# Patient Record
Sex: Male | Born: 1955
Health system: Southern US, Community
[De-identification: ages and names within clinical notes are randomized; demographics above are authoritative.]

## PROBLEM LIST (undated history)

## (undated) DIAGNOSIS — K52831 Collagenous colitis: Secondary | ICD-10-CM

## (undated) DIAGNOSIS — I10 Essential (primary) hypertension: Secondary | ICD-10-CM

## (undated) DIAGNOSIS — I2510 Atherosclerotic heart disease of native coronary artery without angina pectoris: Secondary | ICD-10-CM

## (undated) DIAGNOSIS — A4902 Methicillin resistant Staphylococcus aureus infection, unspecified site: Secondary | ICD-10-CM

## (undated) DIAGNOSIS — K279 Peptic ulcer, site unspecified, unspecified as acute or chronic, without hemorrhage or perforation: Secondary | ICD-10-CM

## (undated) DIAGNOSIS — Z8679 Personal history of other diseases of the circulatory system: Secondary | ICD-10-CM

## (undated) DIAGNOSIS — M545 Low back pain, unspecified: Secondary | ICD-10-CM

## (undated) DIAGNOSIS — G8929 Other chronic pain: Secondary | ICD-10-CM

## (undated) DIAGNOSIS — I4892 Unspecified atrial flutter: Secondary | ICD-10-CM

## (undated) DIAGNOSIS — E785 Hyperlipidemia, unspecified: Secondary | ICD-10-CM

## (undated) DIAGNOSIS — J449 Chronic obstructive pulmonary disease, unspecified: Secondary | ICD-10-CM

## (undated) DIAGNOSIS — S2249XA Multiple fractures of ribs, unspecified side, initial encounter for closed fracture: Secondary | ICD-10-CM

## (undated) HISTORY — PX: ELBOW BURSA SURGERY: SHX615

## (undated) HISTORY — PX: ROTATOR CUFF REPAIR: SHX139

## (undated) HISTORY — DX: Collagenous colitis: K52.831

## (undated) HISTORY — PX: BACK SURGERY: SHX140

## (undated) HISTORY — DX: Hyperlipidemia, unspecified: E78.5

## (undated) HISTORY — DX: Atherosclerotic heart disease of native coronary artery without angina pectoris: I25.10

## (undated) HISTORY — DX: Personal history of other diseases of the circulatory system: Z86.79

## (undated) HISTORY — DX: Essential (primary) hypertension: I10

## (undated) HISTORY — PX: JOINT REPLACEMENT: SHX530

## (undated) HISTORY — DX: Peptic ulcer, site unspecified, unspecified as acute or chronic, without hemorrhage or perforation: K27.9

## (undated) HISTORY — DX: Unspecified atrial flutter: I48.92

---

## 1983-11-19 HISTORY — PX: FINGER AMPUTATION: SHX636

## 1999-09-13 ENCOUNTER — Emergency Department (HOSPITAL_COMMUNITY): Admission: EM | Admit: 1999-09-13 | Discharge: 1999-09-13 | Payer: Self-pay | Admitting: Emergency Medicine

## 1999-11-19 HISTORY — PX: COLONOSCOPY: SHX174

## 2000-04-02 ENCOUNTER — Ambulatory Visit (HOSPITAL_COMMUNITY): Admission: RE | Admit: 2000-04-02 | Discharge: 2000-04-02 | Payer: Self-pay | Admitting: Gastroenterology

## 2000-04-02 ENCOUNTER — Encounter (INDEPENDENT_AMBULATORY_CARE_PROVIDER_SITE_OTHER): Payer: Self-pay | Admitting: *Deleted

## 2002-09-30 ENCOUNTER — Ambulatory Visit (HOSPITAL_COMMUNITY): Admission: RE | Admit: 2002-09-30 | Discharge: 2002-09-30 | Payer: Self-pay | Admitting: Family Medicine

## 2002-09-30 ENCOUNTER — Encounter: Payer: Self-pay | Admitting: Family Medicine

## 2003-08-02 ENCOUNTER — Inpatient Hospital Stay (HOSPITAL_COMMUNITY): Admission: EM | Admit: 2003-08-02 | Discharge: 2003-08-03 | Payer: Self-pay | Admitting: Emergency Medicine

## 2008-11-16 ENCOUNTER — Emergency Department (HOSPITAL_COMMUNITY): Admission: EM | Admit: 2008-11-16 | Discharge: 2008-11-16 | Payer: Self-pay | Admitting: Emergency Medicine

## 2008-11-18 ENCOUNTER — Emergency Department (HOSPITAL_COMMUNITY): Admission: EM | Admit: 2008-11-18 | Discharge: 2008-11-18 | Payer: Self-pay | Admitting: Emergency Medicine

## 2009-07-14 ENCOUNTER — Emergency Department (HOSPITAL_COMMUNITY): Admission: EM | Admit: 2009-07-14 | Discharge: 2009-07-14 | Payer: Self-pay | Admitting: Emergency Medicine

## 2009-11-18 HISTORY — PX: COLONOSCOPY: SHX174

## 2010-10-24 ENCOUNTER — Ambulatory Visit (HOSPITAL_COMMUNITY)
Admission: RE | Admit: 2010-10-24 | Discharge: 2010-10-24 | Payer: Self-pay | Source: Home / Self Care | Admitting: Neurology

## 2010-10-26 ENCOUNTER — Ambulatory Visit (HOSPITAL_COMMUNITY)
Admission: RE | Admit: 2010-10-26 | Discharge: 2010-10-26 | Payer: Self-pay | Source: Home / Self Care | Attending: Gastroenterology | Admitting: Gastroenterology

## 2010-10-29 ENCOUNTER — Encounter: Payer: Self-pay | Admitting: Gastroenterology

## 2010-10-29 ENCOUNTER — Ambulatory Visit (HOSPITAL_COMMUNITY)
Admission: RE | Admit: 2010-10-29 | Discharge: 2010-10-29 | Payer: Self-pay | Source: Home / Self Care | Attending: Gastroenterology | Admitting: Gastroenterology

## 2010-12-19 ENCOUNTER — Encounter (HOSPITAL_COMMUNITY): Payer: Self-pay

## 2010-12-19 ENCOUNTER — Emergency Department (HOSPITAL_COMMUNITY): Admit: 2010-12-19 | Discharge: 2010-12-19 | Disposition: A | Discharge status: Home / Self Care | Payer: Self-pay

## 2010-12-19 ENCOUNTER — Emergency Department (HOSPITAL_COMMUNITY)
Admission: EM | Admit: 2010-12-19 | Discharge: 2010-12-19 | Disposition: A | Discharge status: Home / Self Care | Payer: Self-pay | Attending: Emergency Medicine | Admitting: Emergency Medicine

## 2010-12-19 DIAGNOSIS — R0609 Other forms of dyspnea: Secondary | ICD-10-CM | POA: Insufficient documentation

## 2010-12-19 DIAGNOSIS — R5381 Other malaise: Secondary | ICD-10-CM | POA: Insufficient documentation

## 2010-12-19 DIAGNOSIS — R059 Cough, unspecified: Secondary | ICD-10-CM | POA: Insufficient documentation

## 2010-12-19 DIAGNOSIS — R5383 Other fatigue: Secondary | ICD-10-CM | POA: Insufficient documentation

## 2010-12-19 DIAGNOSIS — R0989 Other specified symptoms and signs involving the circulatory and respiratory systems: Secondary | ICD-10-CM | POA: Insufficient documentation

## 2010-12-19 DIAGNOSIS — R05 Cough: Secondary | ICD-10-CM | POA: Insufficient documentation

## 2010-12-19 DIAGNOSIS — R062 Wheezing: Secondary | ICD-10-CM | POA: Insufficient documentation

## 2010-12-19 DIAGNOSIS — R0602 Shortness of breath: Secondary | ICD-10-CM | POA: Insufficient documentation

## 2010-12-19 DIAGNOSIS — J40 Bronchitis, not specified as acute or chronic: Secondary | ICD-10-CM | POA: Insufficient documentation

## 2010-12-19 DIAGNOSIS — J3489 Other specified disorders of nose and nasal sinuses: Secondary | ICD-10-CM | POA: Insufficient documentation

## 2010-12-19 LAB — CBC
HCT: 44.8 % (ref 39.0–52.0)
Hemoglobin: 16 g/dL (ref 13.0–17.0)
MCH: 33.7 pg (ref 26.0–34.0)
MCHC: 35.7 g/dL (ref 30.0–36.0)
RBC: 4.75 MIL/uL (ref 4.22–5.81)

## 2010-12-19 LAB — DIFFERENTIAL
Lymphocytes Relative: 26 % (ref 12–46)
Lymphs Abs: 2.8 10*3/uL (ref 0.7–4.0)
Monocytes Absolute: 0.7 10*3/uL (ref 0.1–1.0)
Monocytes Relative: 6 % (ref 3–12)
Neutro Abs: 7 10*3/uL (ref 1.7–7.7)
Neutrophils Relative %: 66 % (ref 43–77)

## 2010-12-19 LAB — BASIC METABOLIC PANEL
CO2: 31 mEq/L (ref 19–32)
Calcium: 8.8 mg/dL (ref 8.4–10.5)
Chloride: 106 mEq/L (ref 96–112)
Creatinine, Ser: 0.75 mg/dL (ref 0.4–1.5)
Glucose, Bld: 81 mg/dL (ref 70–99)

## 2011-01-21 ENCOUNTER — Emergency Department (HOSPITAL_COMMUNITY)
Admission: EM | Admit: 2011-01-21 | Discharge: 2011-01-21 | Disposition: A | Discharge status: Home / Self Care | Payer: Self-pay | Attending: Emergency Medicine | Admitting: Emergency Medicine

## 2011-01-21 DIAGNOSIS — M549 Dorsalgia, unspecified: Secondary | ICD-10-CM | POA: Insufficient documentation

## 2011-01-21 DIAGNOSIS — G8929 Other chronic pain: Secondary | ICD-10-CM | POA: Insufficient documentation

## 2011-01-28 LAB — BLOOD GAS, ARTERIAL
Bicarbonate: 22.9 mEq/L (ref 20.0–24.0)
O2 Content: 21 L/min
TCO2: 19.5 mmol/L (ref 0–100)
pH, Arterial: 7.376 (ref 7.350–7.450)
pO2, Arterial: 74.1 mmHg — ABNORMAL LOW (ref 80.0–100.0)

## 2011-02-17 ENCOUNTER — Emergency Department (HOSPITAL_COMMUNITY): Payer: Self-pay

## 2011-02-17 ENCOUNTER — Emergency Department (HOSPITAL_COMMUNITY)
Admission: EM | Admit: 2011-02-17 | Discharge: 2011-02-17 | Disposition: A | Discharge status: Home / Self Care | Payer: Self-pay | Attending: Emergency Medicine | Admitting: Emergency Medicine

## 2011-02-17 DIAGNOSIS — F411 Generalized anxiety disorder: Secondary | ICD-10-CM | POA: Insufficient documentation

## 2011-02-17 DIAGNOSIS — R5383 Other fatigue: Secondary | ICD-10-CM | POA: Insufficient documentation

## 2011-02-17 DIAGNOSIS — R5381 Other malaise: Secondary | ICD-10-CM | POA: Insufficient documentation

## 2011-02-17 LAB — CBC
Hemoglobin: 16.3 g/dL (ref 13.0–17.0)
MCH: 33.3 pg (ref 26.0–34.0)
MCHC: 35.5 g/dL (ref 30.0–36.0)
MCV: 93.9 fL (ref 78.0–100.0)
RBC: 4.89 MIL/uL (ref 4.22–5.81)

## 2011-02-17 LAB — URINALYSIS, ROUTINE W REFLEX MICROSCOPIC
Bilirubin Urine: NEGATIVE
Glucose, UA: NEGATIVE mg/dL
Hgb urine dipstick: NEGATIVE
Ketones, ur: NEGATIVE mg/dL
Protein, ur: NEGATIVE mg/dL
pH: 5.5 (ref 5.0–8.0)

## 2011-02-17 LAB — DIFFERENTIAL
Basophils Relative: 1 % (ref 0–1)
Lymphs Abs: 2.3 10*3/uL (ref 0.7–4.0)
Monocytes Absolute: 0.6 10*3/uL (ref 0.1–1.0)
Monocytes Relative: 5 % (ref 3–12)
Neutro Abs: 8.3 10*3/uL — ABNORMAL HIGH (ref 1.7–7.7)
Neutrophils Relative %: 72 % (ref 43–77)

## 2011-02-17 LAB — COMPREHENSIVE METABOLIC PANEL
Albumin: 3.8 g/dL (ref 3.5–5.2)
Alkaline Phosphatase: 59 U/L (ref 39–117)
BUN: 8 mg/dL (ref 6–23)
CO2: 26 mEq/L (ref 19–32)
Chloride: 104 mEq/L (ref 96–112)
Creatinine, Ser: 0.7 mg/dL (ref 0.4–1.5)
GFR calc non Af Amer: >60 mL/min (ref >60)
Glucose, Bld: 96 mg/dL (ref 70–99)
Potassium: 4.2 mEq/L (ref 3.5–5.1)
Total Bilirubin: 0.3 mg/dL (ref 0.3–1.2)

## 2011-03-06 ENCOUNTER — Other Ambulatory Visit: Payer: Self-pay | Admitting: Gastroenterology

## 2011-03-06 DIAGNOSIS — R5383 Other fatigue: Secondary | ICD-10-CM

## 2011-03-08 ENCOUNTER — Ambulatory Visit (HOSPITAL_COMMUNITY)
Admission: RE | Admit: 2011-03-08 | Discharge: 2011-03-08 | Disposition: A | Discharge status: Home / Self Care | Payer: Self-pay | Source: Ambulatory Visit | Attending: Gastroenterology | Admitting: Gastroenterology

## 2011-03-08 DIAGNOSIS — R5383 Other fatigue: Secondary | ICD-10-CM

## 2011-03-14 ENCOUNTER — Ambulatory Visit (HOSPITAL_COMMUNITY)
Admission: RE | Admit: 2011-03-14 | Discharge: 2011-03-14 | Disposition: A | Discharge status: Home / Self Care | Payer: Self-pay | Source: Ambulatory Visit | Attending: Gastroenterology | Admitting: Gastroenterology

## 2011-03-14 DIAGNOSIS — R4182 Altered mental status, unspecified: Secondary | ICD-10-CM | POA: Insufficient documentation
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
## 2011-04-05 NOTE — Op Note (Signed)
[facility]  Patient:    Garrett Klein, Garrett Klein                       MRN: [id number] Proc. Date: 04/02/00 Adm. Date:  [id number] Disc. Date: [id number] Attending:  Mardella Layman CC:         Jonelle Sports Cheryll Cockayne, M.D.             Vania Rea. Jarold Motto, M.D. [facility]                           Operative Report  PROCEDURE:  Outpatient colonoscopy.  HISTORY:  Mr. Hochstetler is a 55 year old white male who has had four months of watery diarrhea with a 20 pound weight loss.  A GI workup today has been unremarkable, except for depressed TSH level of 0.33; but a normal total T4 of 7.4.  He has had an endoscopy in [place], which has been unremarkable except for NSAID-induced gastroduodenitis.  Lab data showed normal sedimentation rate and liver profile.  It was felt that colonoscopy is indicated to complete his workup and exclude inflammatory bowel disease.  The risks and benefits of this procedure were explained in detail.  He agreed to proceed as planned.  Preoperative cardiopulmonary and mental status exams were unremarkable.  COLONOSCOPY REPORT:  Throughout this procedure the patient was on pulse oximetry and cardiac monitoring.  He tolerated the procedure well, receiving supplemental low-flow oxygen by nasal cannula throughout the procedure.   He was anesthetized with fentanyl 150 mcg and Versed 10 mg IV without any underlying antisedation.  Inspection of his rectum was unremarkable, as was the rectal examination. His rectum was intubated using Olympus adult videocolonoscope.  The colonoscope was advanced easily and rapidly through a well-prepped colon into the cecum.  The base of the cecum including the ileocecal valve was unremarkable.  Pictures obtained for documentation.  The ileocecal valve was easily intubated in the terminal ileum for approximately 15 cm; appeared normal.  Biopsies were obtained in the terminal ileum and labelled "Specimen #1".  The colonoscope  was then withdrawn and went throughout the length of the colon, which otherwise was free of any mucosal or polypoid lesions (including retroflexed view of the rectum).  Random biopsies were obtained throughout the colon and labelled "Specimen #2".  He was extubated without difficulty.  He tolerated the procedure well.  ASSESSMENT:  This was a normal colonoscopy to the terminal ileum, and I doubt this patient has inflammatory bowel disease or microscopic colitis.  I suspect he does have hypothyroidism, which would explain his symptomatology.  RECOMMENDATIONS:  I spoke with Dr. Lillia Mountain this morning.  Will refer this patient to him for further evaluation.  Will repeat his thyroid profile, including total T3 level today.  Biopsy results are, of course, currently pending. DD:  04/02/00 TD:  04/05/00 Job: [id number]

## 2011-06-30 ENCOUNTER — Encounter (HOSPITAL_COMMUNITY): Payer: Self-pay | Admitting: Emergency Medicine

## 2011-06-30 ENCOUNTER — Emergency Department (HOSPITAL_COMMUNITY)
Admission: EM | Admit: 2011-06-30 | Discharge: 2011-06-30 | Disposition: A | Discharge status: Home / Self Care | Payer: Self-pay | Attending: Emergency Medicine | Admitting: Emergency Medicine

## 2011-06-30 DIAGNOSIS — M545 Low back pain, unspecified: Secondary | ICD-10-CM | POA: Insufficient documentation

## 2011-06-30 DIAGNOSIS — G8929 Other chronic pain: Secondary | ICD-10-CM | POA: Insufficient documentation

## 2011-06-30 DIAGNOSIS — F17200 Nicotine dependence, unspecified, uncomplicated: Secondary | ICD-10-CM | POA: Insufficient documentation

## 2011-06-30 DIAGNOSIS — J4489 Other specified chronic obstructive pulmonary disease: Secondary | ICD-10-CM | POA: Insufficient documentation

## 2011-06-30 DIAGNOSIS — J449 Chronic obstructive pulmonary disease, unspecified: Secondary | ICD-10-CM | POA: Insufficient documentation

## 2011-06-30 HISTORY — DX: Chronic obstructive pulmonary disease, unspecified: J44.9

## 2011-06-30 HISTORY — DX: Other chronic pain: G89.29

## 2011-06-30 HISTORY — DX: Low back pain: M54.5

## 2011-06-30 HISTORY — DX: Low back pain, unspecified: M54.50

## 2011-06-30 MED ORDER — OXYCODONE-ACETAMINOPHEN 5-325 MG PO TABS
2.0000 | ORAL_TABLET | Freq: Once | ORAL | Status: AC
  Administered 2011-06-30: 2 via ORAL
  Filled 2011-06-30: qty 2

## 2011-06-30 MED ORDER — METHOCARBAMOL 500 MG PO TABS: 1000.0000 mg | ORAL_TABLET | Freq: Four times a day (QID) | ORAL | Status: AC | PRN

## 2011-06-30 MED ORDER — OXYCODONE-ACETAMINOPHEN 5-325 MG PO TABS: ORAL_TABLET | ORAL | Status: AC

## 2011-06-30 NOTE — ED Notes (Signed)
Pt complaiing of chronic back pain. States he is unable to afford going to primary care md for follow up.

## 2011-06-30 NOTE — ED Provider Notes (Signed)
History     CSN: [id number] Arrival date & time: 06/30/2011  1:18 PM  Chief Complaint  Patient presents with  . Back Pain   HPI  Pt was seen at 1330.  Per pt, c/o gradual onset and persistence of constant acute flair of his chronic low back "pain" for the past several days.  Denies any change in his usual chronic pain pattern.  Denies incont/retention of bowel or bladder, no saddle anesthesia, no focal motor weakness, no tingling/numbness in extremitites, no fevers, no new injury.   The symptoms have been associated with no other complaints. The patient has a significant history of similar symptoms previously.   Past Medical History  Diagnosis Date  . COPD (chronic obstructive pulmonary disease)   . High cholesterol   . Chronic lower back pain     Past Surgical History  Procedure Date  . Back surgery     History reviewed. No pertinent family history.  History  Substance Use Topics  . Smoking status: Current Everyday Smoker -- 1.0 packs/day  . Smokeless tobacco: Not on file  . Alcohol Use: No    Review of Systems ROS: Statement: All systems negative except as marked or noted in the HPI; Constitutional: Negative for fever and chills. ; ; Eyes: Negative for eye pain and discharge. ; ; ENMT: Negative for ear pain, hoarseness, nasal congestion, sinus pressure and sore throat. ; ; Cardiovascular: Negative for chest pain, palpitations, diaphoresis, dyspnea and peripheral edema. ; ; Respiratory: Negative for cough, wheezing and stridor. ; ; Gastrointestinal: Negative for nausea, vomiting, diarrhea and abdominal pain. ; ; Genitourinary: Negative for dysuria, flank pain and hematuria. ; ; Musculoskeletal: +LBP.  Negative for neck pain. ; ; Skin: Negative for rash and skin lesion.; Neuro: Negative for headache, lightheadedness and neck stiffness. Denies tingling/numbness in extremities, no focal motor weakness, no saddle anesthesia, no incont/retention of bowel or bladder.    Physical Exam    BP 146/86  Pulse 79  Temp(Src) 97.2 F (36.2 C) (Oral)  Resp 16  Ht 5\' 9"  (1.753 m)  Wt 190 lb (86.183 kg)  BMI 28.06 kg/m2  SpO2 100%  Physical Exam 1335: Physical examination:  Nursing notes reviewed; Vital signs and O2 SAT reviewed;  Constitutional: Well developed, Well nourished, Well hydrated, In no acute distress; Head:  Normocephalic, atraumatic; Eyes: EOMI, PERRL, No scleral icterus; ENMT: Mouth and pharynx normal, Mucous membranes moist; Neck: Supple, Full range of motion, No lymphadenopathy; Cardiovascular: Regular rate and rhythm, No murmur, rub, or gallop; Respiratory: Breath sounds clear & equal bilaterally, No rales, rhonchi, wheezes, or rub, Normal respiratory effort/excursion; Chest: Nontender, Movement normal; Abdomen: Soft, Nontender, Nondistended, Normal bowel sounds; Genitourinary: No CVA tenderness; Extremities: Pulses normal, No tenderness, No edema, No calf edema or asymmetry.; Neuro: AA&Ox3, Major CN grossly intact.  Strength 5/5 equal bilat UE's and LE's, including great toe dorsiflexion.  DTR 2/4 equal bilat UE's and LE's.  No gross sensory deficits.  Neg straight leg raises bilat.  Gait steady; Skin: Color normal, Warm, Dry; Spine:  No midline CS, TS, LS tenderness.  +TTP left lower lumbar paraspinal muscles.    ED Course  Procedures  MDM MDM Reviewed: nursing note and vitals    Garrett Charles Allison Quarry, DO 07/02/11 1615

## 2011-08-22 LAB — WOUND CULTURE

## 2012-06-21 ENCOUNTER — Emergency Department (HOSPITAL_COMMUNITY): Payer: Self-pay

## 2012-06-21 ENCOUNTER — Emergency Department (HOSPITAL_COMMUNITY)
Admission: EM | Admit: 2012-06-21 | Discharge: 2012-06-21 | Disposition: A | Discharge status: Home / Self Care | Payer: Self-pay | Attending: Emergency Medicine | Admitting: Emergency Medicine

## 2012-06-21 ENCOUNTER — Encounter (HOSPITAL_COMMUNITY): Payer: Self-pay | Admitting: Emergency Medicine

## 2012-06-21 DIAGNOSIS — M7030 Other bursitis of elbow, unspecified elbow: Secondary | ICD-10-CM

## 2012-06-21 DIAGNOSIS — F17200 Nicotine dependence, unspecified, uncomplicated: Secondary | ICD-10-CM | POA: Insufficient documentation

## 2012-06-21 DIAGNOSIS — J4489 Other specified chronic obstructive pulmonary disease: Secondary | ICD-10-CM | POA: Insufficient documentation

## 2012-06-21 DIAGNOSIS — G8929 Other chronic pain: Secondary | ICD-10-CM | POA: Insufficient documentation

## 2012-06-21 DIAGNOSIS — E7800 Pure hypercholesterolemia, unspecified: Secondary | ICD-10-CM | POA: Insufficient documentation

## 2012-06-21 DIAGNOSIS — M7020 Olecranon bursitis, unspecified elbow: Secondary | ICD-10-CM | POA: Insufficient documentation

## 2012-06-21 DIAGNOSIS — J449 Chronic obstructive pulmonary disease, unspecified: Secondary | ICD-10-CM | POA: Insufficient documentation

## 2012-06-21 MED ORDER — DOXYCYCLINE HYCLATE 100 MG PO TABS
100.0000 mg | ORAL_TABLET | Freq: Once | ORAL | Status: AC
  Administered 2012-06-21: 100 mg via ORAL
  Filled 2012-06-21: qty 1

## 2012-06-21 MED ORDER — HYDROCODONE-ACETAMINOPHEN 5-325 MG PO TABS: 1.0000 | ORAL_TABLET | ORAL | Status: AC | PRN

## 2012-06-21 MED ORDER — HYDROCODONE-ACETAMINOPHEN 5-325 MG PO TABS
1.0000 | ORAL_TABLET | Freq: Once | ORAL | Status: AC
  Administered 2012-06-21: 1 via ORAL
  Filled 2012-06-21: qty 1

## 2012-06-21 MED ORDER — DOXYCYCLINE HYCLATE 100 MG PO CAPS: 100.0000 mg | ORAL_CAPSULE | Freq: Two times a day (BID) | ORAL | Status: AC

## 2012-06-21 NOTE — ED Provider Notes (Signed)
History     CSN: [id number]  Arrival date & time 06/21/12  1258   First MD Initiated Contact with Patient 06/21/12 1337      Chief Complaint  Patient presents with  . Elbow Pain    (Consider location/radiation/quality/duration/timing/severity/associated sxs/prior treatment) HPI Comments: Garrett Klein presents with a 4 day history of redness,  Swelling and pain of his right elbow.  He denies trauma to the extremity.  He was seen by his physician at the [facility] 4 days ago for a steroid injection in his right shoulder for treatment of a rotator injury.  He was just starting to feel sore at the elbow during that visit,  But it was evaluated. He has taken diclofenac without much improvement in the elbow pain.  He denies fevers and chills and has no other complaint today.  The history is provided by the patient.    Past Medical History  Diagnosis Date  . COPD (chronic obstructive pulmonary disease)   . High cholesterol   . Chronic lower back pain     Past Surgical History  Procedure Date  . Back surgery     History reviewed. No pertinent family history.  History  Substance Use Topics  . Smoking status: Current Everyday Smoker -- 1.0 packs/day  . Smokeless tobacco: Not on file  . Alcohol Use: No      Review of Systems  Constitutional: Negative for fever.  Musculoskeletal: Positive for joint swelling and arthralgias.  Skin: Positive for color change. Negative for wound.  Neurological: Negative for weakness and numbness.    Allergies  Bee venom and Penicillins  Home Medications   Current Outpatient Rx  Name Route Sig Dispense Refill  . ALPRAZOLAM 1 MG PO TABS Oral Take 1 mg by mouth 3 (three) times daily as needed. For anxiety    . GOODYS EXTRA STRENGTH PO Oral Take 1 Package by mouth as needed. For pain    . DICLOFENAC SODIUM 50 MG PO TBEC Oral Take 50 mg by mouth 2 (two) times daily.    Marland Kitchen GABAPENTIN PO Oral Take 1 capsule by mouth 3 (three) times daily.    Marland Kitchen  GLUCOSAMINE-CHONDROITIN 500-400 MG PO TABS Oral Take 3 tablets by mouth daily.      Marland Kitchen DOXYCYCLINE HYCLATE 100 MG PO CAPS Oral Take 1 capsule (100 mg total) by mouth 2 (two) times daily. 20 capsule 0  . HYDROCODONE-ACETAMINOPHEN 5-325 MG PO TABS Oral Take 1 tablet by mouth every 4 (four) hours as needed for pain. 15 tablet 0    BP 142/83  Pulse 77  Temp 98.6 F (37 C) (Oral)  Resp 18  Ht 5\' 9"  (1.753 m)  Wt 185 lb (83.915 kg)  BMI 27.32 kg/m2  SpO2 98%  Physical Exam  Nursing note and vitals reviewed. Constitutional: He appears well-developed and well-nourished.  HENT:  Head: Normocephalic and atraumatic.  Neck: Neck supple.  Cardiovascular: Normal rate and intact distal pulses.        Pulses equal bilaterally  Pulmonary/Chest: Effort normal and breath sounds normal.  Musculoskeletal: Normal range of motion. He exhibits edema and tenderness.       Right elbow: He exhibits swelling. tenderness found. Olecranon process tenderness noted.       Erythema at right olecranon.   Neurological: He is alert. He has normal strength. He displays normal reflexes. No sensory deficit.       Equal strength  Skin: Skin is warm and dry.  Psychiatric: He has a  normal mood and affect.    ED Course  Procedures (including critical care time)   Labs Reviewed  WOUND CULTURE   Dg Elbow Complete Right  06/21/2012  *RADIOLOGY REPORT*  Clinical Data: Elbow pain  RIGHT ELBOW - COMPLETE 3+ VIEW  Comparison: None  Findings: There is no evidence of fracture or dislocation.  There is no evidence of arthropathy or other focal bone abnormality. Soft tissues are unremarkable.  IMPRESSION:   No acute findings.  Original Report Authenticated By: Rosealee Albee, M.D.     1. Bursitis of elbow     Bursal needle aspirate obtained by Dr. Patria Mane.   Fluid sent for culture.  Aspirate was not purulent appearing.  MDM  Bursitis of right olecranon bursa,  Pending aspirate culture.  Pt was prescribed doxycyline with  first dose given in ed.  Also prescribed hydrocodone for pain.  Pt to return in 2 days for a recheck of his elbow, unless significantly worse sooner.        Burgess Amor, [place] 06/21/12 [phone]

## 2012-06-21 NOTE — ED Notes (Signed)
Pt has redness and swelling of his right elbow since Thursday. Redness extends up into upper arm. Swollen and warm to touch. Pt denies injury. Small black spot noted in middle of swollen area on elbow.

## 2012-06-21 NOTE — ED Provider Notes (Signed)
Apiration of blood/fluid Performed by: Lyanne Co Consent obtained. Required items: required blood products, implants, devices, and special equipment available Patient identity confirmed: verbally with patient Time out: Immediately prior to procedure a "time out" was called to verify the correct patient, procedure, equipment, support staff and site/side marked as required. Preparation: Patient was prepped and draped in the usual sterile fashion. Patient tolerance: Patient tolerated the procedure well with no immediate complications. Location of aspiration: Right elbow olecranon bursa  Fluid obtained was 5 cc and is synovial fluid without frank pus  Medical screening examination/treatment/procedure(s) were conducted as a shared visit with non-physician practitioner(s) and myself.  I personally evaluated the patient during the encounter  I think the patient has bursitis and cellulitis.  Synovial fluid sent for culture.  No frank pus.  No incision and drainage performed.  The bursa was aspirated flat.  Antibiotics and emergency department at home with antibiotics.  48 hour ER followup     Lyanne Co, MD 06/21/12 2016

## 2012-06-21 NOTE — ED Notes (Signed)
r elbow redness and swelling x 4 days. Swelling and redness noted. Denies injury. Nad. Radial pulse strong. Area Warm to touch

## 2012-06-23 LAB — WOUND CULTURE

## 2012-06-24 NOTE — ED Notes (Signed)
+   wound culture. Chart sent to EDP office for review 

## 2012-07-02 NOTE — ED Notes (Signed)
Chart returned from EDP office with order written  By Emilia Beck for  Clindamycin 450 mg sig: one tablet TID x 10 days #30.I have been unable to reach this patient by phone after 3 attempts.  A letter is being sent.

## 2012-11-18 DIAGNOSIS — K52831 Collagenous colitis: Secondary | ICD-10-CM

## 2012-11-18 HISTORY — DX: Collagenous colitis: K52.831

## 2013-03-28 ENCOUNTER — Emergency Department (HOSPITAL_COMMUNITY): Payer: Self-pay

## 2013-03-28 ENCOUNTER — Encounter (HOSPITAL_COMMUNITY): Payer: Self-pay | Admitting: *Deleted

## 2013-03-28 ENCOUNTER — Emergency Department (HOSPITAL_COMMUNITY)
Admission: EM | Admit: 2013-03-28 | Discharge: 2013-03-28 | Disposition: A | Discharge status: Home / Self Care | Payer: Non-veteran care | Attending: Emergency Medicine | Admitting: Emergency Medicine

## 2013-03-28 DIAGNOSIS — F17200 Nicotine dependence, unspecified, uncomplicated: Secondary | ICD-10-CM | POA: Insufficient documentation

## 2013-03-28 DIAGNOSIS — J449 Chronic obstructive pulmonary disease, unspecified: Secondary | ICD-10-CM | POA: Insufficient documentation

## 2013-03-28 DIAGNOSIS — R05 Cough: Secondary | ICD-10-CM | POA: Insufficient documentation

## 2013-03-28 DIAGNOSIS — E559 Vitamin D deficiency, unspecified: Secondary | ICD-10-CM | POA: Insufficient documentation

## 2013-03-28 DIAGNOSIS — Z880 Allergy status to penicillin: Secondary | ICD-10-CM | POA: Insufficient documentation

## 2013-03-28 DIAGNOSIS — G8929 Other chronic pain: Secondary | ICD-10-CM | POA: Insufficient documentation

## 2013-03-28 DIAGNOSIS — J029 Acute pharyngitis, unspecified: Secondary | ICD-10-CM | POA: Insufficient documentation

## 2013-03-28 DIAGNOSIS — J4489 Other specified chronic obstructive pulmonary disease: Secondary | ICD-10-CM | POA: Insufficient documentation

## 2013-03-28 DIAGNOSIS — R059 Cough, unspecified: Secondary | ICD-10-CM | POA: Insufficient documentation

## 2013-03-28 DIAGNOSIS — R509 Fever, unspecified: Secondary | ICD-10-CM | POA: Insufficient documentation

## 2013-03-28 DIAGNOSIS — Z8739 Personal history of other diseases of the musculoskeletal system and connective tissue: Secondary | ICD-10-CM | POA: Insufficient documentation

## 2013-03-28 DIAGNOSIS — M545 Low back pain, unspecified: Secondary | ICD-10-CM | POA: Insufficient documentation

## 2013-03-28 DIAGNOSIS — R109 Unspecified abdominal pain: Secondary | ICD-10-CM | POA: Insufficient documentation

## 2013-03-28 DIAGNOSIS — R0789 Other chest pain: Secondary | ICD-10-CM | POA: Insufficient documentation

## 2013-03-28 DIAGNOSIS — Z79899 Other long term (current) drug therapy: Secondary | ICD-10-CM | POA: Insufficient documentation

## 2013-03-28 DIAGNOSIS — R197 Diarrhea, unspecified: Secondary | ICD-10-CM

## 2013-03-28 DIAGNOSIS — E7800 Pure hypercholesterolemia, unspecified: Secondary | ICD-10-CM | POA: Insufficient documentation

## 2013-03-28 LAB — COMPREHENSIVE METABOLIC PANEL
ALT: 19 U/L (ref 0–53)
AST: 14 U/L (ref 0–37)
Albumin: 3.4 g/dL — ABNORMAL LOW (ref 3.5–5.2)
Calcium: 8.6 mg/dL (ref 8.4–10.5)
Sodium: 137 mEq/L (ref 135–145)
Total Protein: 6 g/dL (ref 6.0–8.3)

## 2013-03-28 LAB — CBC
HCT: 42.5 % (ref 39.0–52.0)
Hemoglobin: 15.3 g/dL (ref 13.0–17.0)
MCH: 33.4 pg (ref 26.0–34.0)
MCHC: 36 g/dL (ref 30.0–36.0)
MCV: 92.8 fL (ref 78.0–100.0)

## 2013-03-28 LAB — URINALYSIS, ROUTINE W REFLEX MICROSCOPIC
Glucose, UA: NEGATIVE mg/dL
Hgb urine dipstick: NEGATIVE
Ketones, ur: NEGATIVE mg/dL
Protein, ur: NEGATIVE mg/dL

## 2013-03-28 LAB — LIPASE, BLOOD: Lipase: 28 U/L (ref 11–59)

## 2013-03-28 MED ORDER — DIPHENOXYLATE-ATROPINE 2.5-0.025 MG PO TABS: ORAL_TABLET | ORAL | Status: DC

## 2013-03-28 MED ORDER — DIPHENOXYLATE-ATROPINE 2.5-0.025 MG PO TABS
2.0000 | ORAL_TABLET | Freq: Once | ORAL | Status: AC
  Administered 2013-03-28: 2 via ORAL

## 2013-03-28 MED ORDER — SODIUM CHLORIDE 0.9 % IV SOLN: INTRAVENOUS | Status: DC

## 2013-03-28 MED ORDER — IOHEXOL 300 MG/ML  SOLN
100.0000 mL | Freq: Once | INTRAMUSCULAR | Status: AC | PRN
  Administered 2013-03-28: 100 mL via INTRAVENOUS

## 2013-03-28 MED ORDER — IOHEXOL 300 MG/ML  SOLN
50.0000 mL | Freq: Once | INTRAMUSCULAR | Status: AC | PRN
  Administered 2013-03-28: 50 mL via ORAL

## 2013-03-28 MED ORDER — SODIUM CHLORIDE 0.9 % IV SOLN
1000.0000 mL | INTRAVENOUS | Status: DC
  Administered 2013-03-28: 1000 mL via INTRAVENOUS

## 2013-03-28 MED ORDER — DIPHENOXYLATE-ATROPINE 2.5-0.025 MG PO TABS
ORAL_TABLET | ORAL | Status: AC
  Filled 2013-03-28: qty 2

## 2013-03-28 NOTE — ED Notes (Addendum)
Pt c/o diarrhea and abd pain that started two months ago, has been seen by [place] with no diagnosis, pt states "I am just tired of it". And it is getting worse,

## 2013-03-28 NOTE — ED Provider Notes (Signed)
History  This chart was scribed for Osvaldo Human, MD by Jiles Prows, ED Scribe. The patient was seen in room APA17/AP[facility]17 and the patient's care was started at 3:45 PM.    CSN: [id number]  Arrival date & time 03/28/13  1520   Chief Complaint  Patient presents with  . Diarrhea    The history is provided by the patient and medical records. No language interpreter was used.   HPI Comments: Garrett Klein is a 57 y.o. Male with COPD who presents to the Emergency Department complaining of constant, moderate to severe, worsening diarrhea that began two months ago. He states the pain is worst in the morning, and he uses the bathroom about 25 times a day eliminating liquid feces. He reports subjective fever (currently 97.7), chronic cough, intermittent chest pain, and mild sore throat. Pt reports that he was seen at a clinic in [place] where some labs were run.  He reports his vitamin D was low and cholesterol was high so he was prescribed Vitamin D pills.  Pt denies headache, diaphoresis, chills, nausea, vomiting, weakness, SOB and any other pain.   Surgical history includes partial hip replacement, digit removal, elbow bursitis, and back surgery on L4 and L5.  He reports that after the bursitis surgery, there were complications that were treated with antibiotics for over 2 months.  He believes these symptoms could be side effects of that antibiotic treatment.  Patient is preparing for a total hip replacement in [facility] hospital in [place], [place] on June 24th. He smokes 1/2 pack a day and denies smoking.  Past Medical History  Diagnosis Date  . COPD (chronic obstructive pulmonary disease)   . High cholesterol   . Chronic lower back pain     Past Surgical History  Procedure Laterality Date  . Back surgery      No family history on file.  History  Substance Use Topics  . Smoking status: Current Every Day Smoker -- 1.00 packs/day  . Smokeless tobacco: Not on file  . Alcohol Use: No    Review  of Systems  Constitutional: Positive for fever (Subjective). Negative for chills.  HENT: Positive for sore throat.   Respiratory: Positive for cough and chest tightness. Negative for shortness of breath.   Gastrointestinal: Positive for diarrhea. Negative for nausea and vomiting.  All other systems reviewed and are negative.   Allergies  Bee venom; Hydrocodone; Penicillins; and Sulfa antibiotics  Home Medications   Current Outpatient Rx  Name  Route  Sig  Dispense  Refill  . ALPRAZolam (XANAX) 1 MG tablet   Oral   Take 1 mg by mouth 3 (three) times daily as needed. For anxiety         . Aspirin-Acetaminophen-Caffeine (GOODYS EXTRA STRENGTH PO)   Oral   Take 1 Package by mouth as needed. For pain         . diclofenac (VOLTAREN) 50 MG EC tablet   Oral   Take 50 mg by mouth 2 (two) times daily.         Marland Kitchen GABAPENTIN PO   Oral   Take 1 capsule by mouth 3 (three) times daily.         Marland Kitchen glucosamine-chondroitin 500-400 MG tablet   Oral   Take 3 tablets by mouth daily.            Triage Vitals: BP 121/74  Pulse 85  Temp(Src) 97.7 F (36.5 C) (Oral)  Resp 20  Ht 5\' 10"  (1.778  m)  Wt 187 lb (84.823 kg)  BMI 26.83 kg/m2  SpO2 99%  Physical Exam  Nursing note and vitals reviewed. Constitutional: He is oriented to person, place, and time. He appears well-developed and well-nourished.  HENT:  Head: Normocephalic and atraumatic.  Adentureless.   Eyes: Conjunctivae and EOM are normal. Pupils are equal, round, and reactive to light. Right eye exhibits no discharge. Left eye exhibits no discharge.  Neck: Normal range of motion. Neck supple.  Cardiovascular: Normal rate, regular rhythm and normal heart sounds.   Pulmonary/Chest: Effort normal. No respiratory distress.  Occasional rhonchi.  Abdominal: Soft. He exhibits no distension and no mass. There is tenderness (Lower quadrants).  High pitched bowel sounds.  Neurological: He is alert and oriented to person, place,  and time.  Skin: Skin is warm and dry.  Psychiatric: He has a normal mood and affect. His behavior is normal.    ED Course  Procedures (including critical care time) DIAGNOSTIC STUDIES: Oxygen Saturation is 99% on RA, normal by my interpretation.    COORDINATION OF CARE: 3:53 PM - Discussed ED treatment with pt at bedside including urinalysis, CT scan, x-ray, and labs and pt agrees.    Date: 03/28/2013  Rate: 72  Rhythm: normal sinus rhythm  QRS Axis: normal  Intervals: normal  ST/T Wave abnormalities: normal  Conduction Disutrbances:right bundle branch block  Narrative Interpretation: Abnormal EKG  Old EKG Reviewed: changes noted--Pt's incomplete RBBB seen on tracing of 02/16/2101 is now a complete RBBB.  6:45 PM Results for orders placed during the hospital encounter of 03/28/13  CLOSTRIDIUM DIFFICILE BY PCR      Result Value Range   C difficile by pcr NEGATIVE  NEGATIVE  COMPREHENSIVE METABOLIC PANEL      Result Value Range   Sodium 137  135 - 145 mEq/L   Potassium 4.4  3.5 - 5.1 mEq/L   Chloride 103  96 - 112 mEq/L   CO2 26  19 - 32 mEq/L   Glucose, Bld 75  70 - 99 mg/dL   BUN 9  6 - 23 mg/dL   Creatinine, Ser 4.54  0.50 - 1.35 mg/dL   Calcium 8.6  8.4 - 09.8 mg/dL   Total Protein 6.0  6.0 - 8.3 g/dL   Albumin 3.4 (*) 3.5 - 5.2 g/dL   AST 14  0 - 37 U/L   ALT 19  0 - 53 U/L   Alkaline Phosphatase 69  39 - 117 U/L   Total Bilirubin 0.2 (*) 0.3 - 1.2 mg/dL   GFR calc non Af Amer >90  >90 mL/min   GFR calc Af Amer >90  >90 mL/min  CBC      Result Value Range   WBC 8.4  4.0 - 10.5 K/uL   RBC 4.58  4.22 - 5.81 MIL/uL   Hemoglobin 15.3  13.0 - 17.0 g/dL   HCT 11.9  14.7 - 82.9 %   MCV 92.8  78.0 - 100.0 fL   MCH 33.4  26.0 - 34.0 pg   MCHC 36.0  30.0 - 36.0 g/dL   RDW 56.2  13.0 - 86.5 %   Platelets 310  150 - 400 K/uL  URINALYSIS, ROUTINE W REFLEX MICROSCOPIC      Result Value Range   Color, Urine YELLOW  YELLOW   APPearance CLEAR  CLEAR   Specific Gravity,  Urine <1.005 (*) 1.005 - 1.030   pH 5.5  5.0 - 8.0   Glucose, UA NEGATIVE  NEGATIVE  mg/dL   Hgb urine dipstick NEGATIVE  NEGATIVE   Bilirubin Urine NEGATIVE  NEGATIVE   Ketones, ur NEGATIVE  NEGATIVE mg/dL   Protein, ur NEGATIVE  NEGATIVE mg/dL   Urobilinogen, UA 0.2  0.0 - 1.0 mg/dL   Nitrite NEGATIVE  NEGATIVE   Leukocytes, UA NEGATIVE  NEGATIVE  LIPASE, BLOOD      Result Value Range   Lipase 28  11 - 59 U/L   Dg Chest 2 View  03/28/2013  *RADIOLOGY REPORT*  Clinical Data: Chronic cough, smoker.  CHEST - 2 VIEW  Comparison: 02/17/2011  Findings: There is hyperinflation of the lungs compatible with COPD.  Heart and mediastinal contours are within normal limits.  No focal opacities or effusions.  No acute bony abnormality.  IMPRESSION: Hyperinflation/COPD.  No acute findings   Original Report Authenticated By: Charlett Nose, M.D.    Ct Abdomen Pelvis W Contrast  03/28/2013  *RADIOLOGY REPORT*  Clinical Data: Diarrhea, abdominal pain.  CT ABDOMEN AND PELVIS WITH CONTRAST  Technique:  Multidetector CT imaging of the abdomen and pelvis was performed following the standard protocol during bolus administration of intravenous contrast.  Contrast:  OMNIPAQUE IOHEXOL 300 MG/ML  SOLN  Comparison: None.  Findings: Dependent atelectasis in the lung bases.  No effusions. Heart is normal size.  Small hiatal hernia present.  Liver, gallbladder, spleen, pancreas, adrenals and kidneys are unremarkable.  Small exophytic cyst off the lower pole of the right kidney.  No renal or ureteral stones.  No hydronephrosis.  Urinary bladder is grossly unremarkable.  Appendix is visualized and is normal.  Ectasia of the distal abdominal aorta measuring 2.5 cm.  Large and small bowel are grossly unremarkable.  IMPRESSION: No acute findings in the abdomen or pelvis.   Original Report Authenticated By: Charlett Nose, M.D.    6:52 PM - Lab tests and CT of the abdomen and pelvis showed no serious cause of diarrhea.  Rx Lomotil  2 tabs q4h prn diarrhea.  F/U with his doctors at the [facility] in [place].    1. Diarrhea     I personally performed the services described in this documentation, which was scribed in my presence. The recorded information has been reviewed and is accurate.  Osvaldo Human, M.D.      Carleene Cooper III, MD 03/29/13 [phone]

## 2013-03-28 NOTE — ED Notes (Signed)
Pt back in room from xray. Awaiting to go to CT department. Pt given a nurses cap to take with him to restroom to help collect stool sample if urge to have a BM hits him. NAD. Pt is very pleasant

## 2013-03-28 NOTE — ED Notes (Signed)
Pt unable to void at this time. 

## 2013-04-18 HISTORY — PX: COLONOSCOPY: SHX174

## 2013-09-18 DIAGNOSIS — Z9889 Other specified postprocedural states: Secondary | ICD-10-CM

## 2014-01-26 DIAGNOSIS — Z9889 Other specified postprocedural states: Secondary | ICD-10-CM

## 2014-04-23 ENCOUNTER — Inpatient Hospital Stay (HOSPITAL_COMMUNITY)
Admission: EM | Admit: 2014-04-23 | Discharge: 2014-04-25 | DRG: 195 | Disposition: A | Discharge status: Home / Self Care | Payer: Non-veteran care | Attending: Internal Medicine | Admitting: Internal Medicine

## 2014-04-23 ENCOUNTER — Emergency Department (HOSPITAL_COMMUNITY): Payer: Non-veteran care

## 2014-04-23 ENCOUNTER — Encounter (HOSPITAL_COMMUNITY): Payer: Self-pay | Admitting: Emergency Medicine

## 2014-04-23 DIAGNOSIS — J4489 Other specified chronic obstructive pulmonary disease: Secondary | ICD-10-CM | POA: Diagnosis present

## 2014-04-23 DIAGNOSIS — E7800 Pure hypercholesterolemia, unspecified: Secondary | ICD-10-CM | POA: Diagnosis present

## 2014-04-23 DIAGNOSIS — Z23 Encounter for immunization: Secondary | ICD-10-CM

## 2014-04-23 DIAGNOSIS — F17200 Nicotine dependence, unspecified, uncomplicated: Secondary | ICD-10-CM | POA: Diagnosis present

## 2014-04-23 DIAGNOSIS — M7020 Olecranon bursitis, unspecified elbow: Secondary | ICD-10-CM | POA: Diagnosis present

## 2014-04-23 DIAGNOSIS — G8929 Other chronic pain: Secondary | ICD-10-CM | POA: Diagnosis present

## 2014-04-23 DIAGNOSIS — M129 Arthropathy, unspecified: Secondary | ICD-10-CM

## 2014-04-23 DIAGNOSIS — J9600 Acute respiratory failure, unspecified whether with hypoxia or hypercapnia: Secondary | ICD-10-CM

## 2014-04-23 DIAGNOSIS — E876 Hypokalemia: Secondary | ICD-10-CM | POA: Diagnosis present

## 2014-04-23 DIAGNOSIS — M1990 Unspecified osteoarthritis, unspecified site: Secondary | ICD-10-CM | POA: Insufficient documentation

## 2014-04-23 DIAGNOSIS — Z96649 Presence of unspecified artificial hip joint: Secondary | ICD-10-CM | POA: Diagnosis not present

## 2014-04-23 DIAGNOSIS — Z720 Tobacco use: Secondary | ICD-10-CM | POA: Diagnosis present

## 2014-04-23 DIAGNOSIS — J13 Pneumonia due to Streptococcus pneumoniae: Secondary | ICD-10-CM | POA: Diagnosis present

## 2014-04-23 DIAGNOSIS — G4710 Hypersomnia, unspecified: Secondary | ICD-10-CM

## 2014-04-23 DIAGNOSIS — J449 Chronic obstructive pulmonary disease, unspecified: Secondary | ICD-10-CM | POA: Diagnosis present

## 2014-04-23 DIAGNOSIS — M545 Low back pain, unspecified: Secondary | ICD-10-CM | POA: Diagnosis present

## 2014-04-23 DIAGNOSIS — J189 Pneumonia, unspecified organism: Secondary | ICD-10-CM

## 2014-04-23 DIAGNOSIS — Z9889 Other specified postprocedural states: Secondary | ICD-10-CM

## 2014-04-23 DIAGNOSIS — M7030 Other bursitis of elbow, unspecified elbow: Secondary | ICD-10-CM | POA: Diagnosis present

## 2014-04-23 DIAGNOSIS — Z880 Allergy status to penicillin: Secondary | ICD-10-CM

## 2014-04-23 DIAGNOSIS — Z8249 Family history of ischemic heart disease and other diseases of the circulatory system: Secondary | ICD-10-CM

## 2014-04-23 DIAGNOSIS — F411 Generalized anxiety disorder: Secondary | ICD-10-CM

## 2014-04-23 DIAGNOSIS — R5381 Other malaise: Secondary | ICD-10-CM | POA: Diagnosis not present

## 2014-04-23 LAB — COMPREHENSIVE METABOLIC PANEL
ALBUMIN: 3 g/dL — AB (ref 3.5–5.2)
ALT: 13 U/L (ref 0–53)
AST: 31 U/L (ref 0–37)
Alkaline Phosphatase: 95 U/L (ref 39–117)
BUN: 12 mg/dL (ref 6–23)
CALCIUM: 8.6 mg/dL (ref 8.4–10.5)
CO2: 30 mEq/L (ref 19–32)
CREATININE: 0.61 mg/dL (ref 0.50–1.35)
Chloride: 97 mEq/L (ref 96–112)
GFR calc Af Amer: >90 mL/min (ref >90)
GFR calc non Af Amer: >90 mL/min (ref >90)
Glucose, Bld: 125 mg/dL — ABNORMAL HIGH (ref 70–99)
Potassium: 4 mEq/L (ref 3.7–5.3)
SODIUM: 137 meq/L (ref 137–147)
Total Bilirubin: 0.6 mg/dL (ref 0.3–1.2)
Total Protein: 5.7 g/dL — ABNORMAL LOW (ref 6.0–8.3)

## 2014-04-23 LAB — BLOOD GAS, ARTERIAL
Acid-Base Excess: 5.2 mmol/L — ABNORMAL HIGH (ref 0.0–2.0)
BICARBONATE: 30 meq/L — AB (ref 20.0–24.0)
Drawn by: 38235
FIO2: 0.28 %
O2 Saturation: 92.3 %
PCO2 ART: 50.6 mmHg — AB (ref 35.0–45.0)
PH ART: 7.39 (ref 7.350–7.450)
PO2 ART: 64.3 mmHg — AB (ref 80.0–100.0)
Patient temperature: 37
TCO2: 26.9 mmol/L (ref 0–100)

## 2014-04-23 LAB — CBC WITH DIFFERENTIAL/PLATELET
BASOS ABS: 0 10*3/uL (ref 0.0–0.1)
BASOS PCT: 0 % (ref 0–1)
EOS PCT: 0 % (ref 0–5)
Eosinophils Absolute: 0 10*3/uL (ref 0.0–0.7)
HEMATOCRIT: 37.6 % — AB (ref 39.0–52.0)
Hemoglobin: 12.7 g/dL — ABNORMAL LOW (ref 13.0–17.0)
LYMPHS PCT: 7 % — AB (ref 12–46)
Lymphs Abs: 1.5 10*3/uL (ref 0.7–4.0)
MCH: 30 pg (ref 26.0–34.0)
MCHC: 33.8 g/dL (ref 30.0–36.0)
MCV: 88.9 fL (ref 78.0–100.0)
MONO ABS: 1.1 10*3/uL — AB (ref 0.1–1.0)
Monocytes Relative: 5 % (ref 3–12)
NEUTROS ABS: 17.9 10*3/uL — AB (ref 1.7–7.7)
Neutrophils Relative %: 88 % — ABNORMAL HIGH (ref 43–77)
PLATELETS: 276 10*3/uL (ref 150–400)
RBC: 4.23 MIL/uL (ref 4.22–5.81)
RDW: 14.7 % (ref 11.5–15.5)
WBC: 20.6 10*3/uL — AB (ref 4.0–10.5)

## 2014-04-23 MED ORDER — IPRATROPIUM BROMIDE 0.02 % IN SOLN: 0.5000 mg | RESPIRATORY_TRACT | Status: DC

## 2014-04-23 MED ORDER — ALBUTEROL (5 MG/ML) CONTINUOUS INHALATION SOLN
15.0000 mg/h | INHALATION_SOLUTION | Freq: Once | RESPIRATORY_TRACT | Status: AC
  Administered 2014-04-23: 15 mg/h via RESPIRATORY_TRACT
  Filled 2014-04-23: qty 20

## 2014-04-23 MED ORDER — SODIUM CHLORIDE 0.9 % IV BOLUS (SEPSIS)
1000.0000 mL | Freq: Once | INTRAVENOUS | Status: AC
  Administered 2014-04-23: 1000 mL via INTRAVENOUS

## 2014-04-23 MED ORDER — SODIUM CHLORIDE 0.9 % IV SOLN
INTRAVENOUS | Status: DC
Start: 2014-04-23 — End: 2014-04-23

## 2014-04-23 MED ORDER — VANCOMYCIN HCL IN DEXTROSE 1-5 GM/200ML-% IV SOLN
INTRAVENOUS | Status: AC
  Filled 2014-04-23: qty 400

## 2014-04-23 MED ORDER — LEVOFLOXACIN IN D5W 500 MG/100ML IV SOLN
500.0000 mg | Freq: Once | INTRAVENOUS | Status: AC
  Administered 2014-04-23: 500 mg via INTRAVENOUS
  Filled 2014-04-23: qty 100

## 2014-04-23 MED ORDER — LEVOFLOXACIN IN D5W 750 MG/150ML IV SOLN
750.0000 mg | INTRAVENOUS | Status: DC
  Administered 2014-04-24 – 2014-04-25 (×2): 750 mg via INTRAVENOUS
  Filled 2014-04-23 (×2): qty 150

## 2014-04-23 MED ORDER — ALPRAZOLAM 0.25 MG PO TABS
0.2500 mg | ORAL_TABLET | Freq: Three times a day (TID) | ORAL | Status: DC | PRN
  Administered 2014-04-23 – 2014-04-24 (×2): 0.25 mg via ORAL
  Filled 2014-04-23 (×2): qty 1

## 2014-04-23 MED ORDER — ALBUTEROL SULFATE (2.5 MG/3ML) 0.083% IN NEBU: 5.0000 mg | INHALATION_SOLUTION | RESPIRATORY_TRACT | Status: AC | PRN

## 2014-04-23 MED ORDER — VANCOMYCIN HCL IN DEXTROSE 1-5 GM/200ML-% IV SOLN
1000.0000 mg | Freq: Three times a day (TID) | INTRAVENOUS | Status: DC
  Administered 2014-04-23 – 2014-04-25 (×5): 1000 mg via INTRAVENOUS
  Filled 2014-04-23 (×9): qty 200

## 2014-04-23 MED ORDER — SODIUM CHLORIDE 0.9 % IV SOLN
INTRAVENOUS | Status: DC
  Administered 2014-04-23 – 2014-04-25 (×2): via INTRAVENOUS

## 2014-04-23 MED ORDER — GUAIFENESIN ER 600 MG PO TB12
1200.0000 mg | ORAL_TABLET | Freq: Two times a day (BID) | ORAL | Status: DC
  Administered 2014-04-23 – 2014-04-25 (×4): 1200 mg via ORAL
  Filled 2014-04-23 (×4): qty 2

## 2014-04-23 MED ORDER — ENOXAPARIN SODIUM 40 MG/0.4ML ~~LOC~~ SOLN
40.0000 mg | SUBCUTANEOUS | Status: DC
  Administered 2014-04-23 – 2014-04-24 (×2): 40 mg via SUBCUTANEOUS
  Filled 2014-04-23 (×2): qty 0.4

## 2014-04-23 MED ORDER — METHYLPREDNISOLONE SODIUM SUCC 125 MG IJ SOLR
125.0000 mg | Freq: Once | INTRAMUSCULAR | Status: AC
  Administered 2014-04-23: 125 mg via INTRAVENOUS
  Filled 2014-04-23: qty 2

## 2014-04-23 MED ORDER — PNEUMOCOCCAL VAC POLYVALENT 25 MCG/0.5ML IJ INJ
0.5000 mL | INJECTION | INTRAMUSCULAR | Status: AC
  Administered 2014-04-24: 0.5 mL via INTRAMUSCULAR
  Filled 2014-04-23: qty 0.5

## 2014-04-23 MED ORDER — IPRATROPIUM-ALBUTEROL 0.5-2.5 (3) MG/3ML IN SOLN
3.0000 mL | RESPIRATORY_TRACT | Status: DC
  Administered 2014-04-23 – 2014-04-24 (×3): 3 mL via RESPIRATORY_TRACT
  Filled 2014-04-23 (×3): qty 3

## 2014-04-23 MED ORDER — ACETAMINOPHEN 325 MG PO TABS: 650.0000 mg | ORAL_TABLET | Freq: Four times a day (QID) | ORAL | Status: DC | PRN

## 2014-04-23 MED ORDER — NICOTINE 14 MG/24HR TD PT24
14.0000 mg | MEDICATED_PATCH | Freq: Every day | TRANSDERMAL | Status: DC
  Administered 2014-04-23 – 2014-04-25 (×3): 14 mg via TRANSDERMAL
  Filled 2014-04-23 (×3): qty 1

## 2014-04-23 MED ORDER — ALBUTEROL SULFATE (2.5 MG/3ML) 0.083% IN NEBU
2.5000 mg | INHALATION_SOLUTION | RESPIRATORY_TRACT | Status: DC
  Administered 2014-04-23: 2.5 mg via RESPIRATORY_TRACT
  Filled 2014-04-23: qty 3

## 2014-04-23 MED ORDER — IPRATROPIUM BROMIDE 0.02 % IN SOLN
0.5000 mg | Freq: Once | RESPIRATORY_TRACT | Status: AC
  Administered 2014-04-23: 0.5 mg via RESPIRATORY_TRACT
  Filled 2014-04-23: qty 2.5

## 2014-04-23 NOTE — Progress Notes (Signed)
ANTIBIOTIC CONSULT NOTE - INITIAL  Pharmacy Consult for Levaquin / Vancomcyin Indication: rule out pneumonia  Allergies  Allergen Reactions  . Bee Venom Swelling  . Hydrocodone     Rash   . Penicillins Other (See Comments)    Reaction in childhood, caused patient to pass out  . Sulfa Antibiotics     Rash,     Patient Measurements: Height: 5\' 10"  (177.8 cm) Weight: 169 lb (76.658 kg) IBW/kg (Calculated) : 73   Vital Signs: Temp: 99.3 F (37.4 C) (06/06 1541) Temp src: Oral (06/06 1541) BP: 113/63 mmHg (06/06 1830) Pulse Rate: 95 (06/06 1830) Intake/Output from previous day:   Intake/Output from this shift:    Labs:  Recent Labs  04/23/14 1545  WBC 20.6*  HGB 12.7*  PLT 276  CREATININE 0.61   Estimated Creatinine Clearance: 101.4 ml/min (by C-G formula based on Cr of 0.61). No results found for this basename: VANCOTROUGH, VANCOPEAK, VANCORANDOM, GENTTROUGH, GENTPEAK, GENTRANDOM, TOBRATROUGH, TOBRAPEAK, TOBRARND, AMIKACINPEAK, AMIKACINTROU, AMIKACIN,  in the last 72 hours   Microbiology: No results found for this or any previous visit (from the past 720 hour(s)).  Medical History: Past Medical History  Diagnosis Date  . COPD (chronic obstructive pulmonary disease)   . High cholesterol   . Chronic lower back pain     Medications:  Prescriptions prior to admission  Medication Sig Dispense Refill  . albuterol (PROVENTIL HFA;VENTOLIN HFA) 108 (90 BASE) MCG/ACT inhaler Inhale 2 puffs into the lungs every 6 (six) hours as needed for wheezing or shortness of breath.      . ALPRAZolam (XANAX) 1 MG tablet Take 1 mg by mouth 3 (three) times daily as needed. For anxiety      . Aspirin-Acetaminophen-Caffeine (GOODYS EXTRA STRENGTH PO) Take 1 Package by mouth as needed. For pain      . glucosamine-chondroitin 500-400 MG tablet Take 3 tablets by mouth daily.        . diclofenac (VOLTAREN) 50 MG EC tablet Take 50 mg by mouth 2 (two) times daily.      .  diphenoxylate-atropine (LOMOTIL) 2.5-0.025 MG per tablet Take 2 tablets every 4 hours as long as you have diarrhea.  30 tablet  0  . GABAPENTIN PO Take 1 capsule by mouth 3 (three) times daily.       Assessment: Okay for Protocol, patient received initial dose of Levaquin in ED.  58 yo male with COPD.  Levaquin 6/6 >> Vancomycin 6/6 >>  Goal of Therapy:  Vancomycin trough level 15-20 mcg/ml  Plan:  Levaquin 750mg  IV every 24 hours. Vancomycin 1gm IV every 8 hours. Measure antibiotic drug levels at steady state Follow up culture results  Pricilla Larsson 04/23/2014,7:54 PM

## 2014-04-23 NOTE — ED Provider Notes (Signed)
CSN: [id number]     Arrival date & time 04/23/14  1533 History   First MD Initiated Contact with Patient 04/23/14 1631     Chief Complaint  Patient presents with  . Fatigue  . Shortness of Breath     (Consider location/radiation/quality/duration/timing/severity/associated sxs/prior Treatment) HPI Patient reports for the past 3 days he has had excess of sleepiness. He states he sleeps on it however when he gets up he still was sleeping all day. He reports a cough with green sputum production. He denies having any fever. He states he has wheezing in his nebulizer helps. He denies sore throat, rhinorrhea, nausea, vomiting, or diarrhea. He denies any difficulty urinating. He states he is not on oxygen at home.  PCP Dr Royce Macadamia in [place]  Past Medical History  Diagnosis Date  . COPD (chronic obstructive pulmonary disease)   . High cholesterol   . Chronic lower back pain    Past Surgical History  Procedure Laterality Date  . Back surgery     No family history on file. History  Substance Use Topics  . Smoking status: Current Every Day Smoker -- 1.00 packs/day  . Smokeless tobacco: Not on file  . Alcohol Use: No  lives at home Lives with spouse  Review of Systems  All other systems reviewed and are negative.     Allergies  Bee venom; Hydrocodone; Penicillins; and Sulfa antibiotics  Home Medications   Prior to Admission medications   Medication Sig Start Date End Date Taking? Authorizing Provider  albuterol (PROVENTIL HFA;VENTOLIN HFA) 108 (90 BASE) MCG/ACT inhaler Inhale 2 puffs into the lungs every 6 (six) hours as needed for wheezing or shortness of breath.   Yes Historical Provider, MD  ALPRAZolam [name]) 1 MG tablet Take 1 mg by mouth 3 (three) times daily as needed. For anxiety   Yes Historical Provider, MD  Aspirin-Acetaminophen-Caffeine (GOODYS EXTRA STRENGTH PO) Take 1 Package by mouth as needed. For pain   Yes Historical Provider, MD  glucosamine-chondroitin 500-400  MG tablet Take 3 tablets by mouth daily.     Yes Historical Provider, MD  diclofenac (VOLTAREN) 50 MG EC tablet Take 50 mg by mouth 2 (two) times daily.    Historical Provider, MD  diphenoxylate-atropine (LOMOTIL) 2.5-0.025 MG per tablet Take 2 tablets every 4 hours as long as you have diarrhea. 03/28/13   Mylinda Latina III, MD  GABAPENTIN PO Take 1 capsule by mouth 3 (three) times daily.    Historical Provider, MD   BP 113/62  Pulse 82  Temp(Src) 99.3 F (37.4 C) (Oral)  Resp 18  Ht 5\' 10"  (1.778 m)  Wt 169 lb (76.658 kg)  BMI 24.25 kg/m2  SpO2 99%  Vital signs normal except low grade temp   Physical Exam  Nursing note and vitals reviewed. Constitutional: He is oriented to person, place, and time. He appears well-developed and well-nourished.  Non-toxic appearance. He does not appear ill. No distress.  HENT:  Head: Normocephalic and atraumatic.  Right Ear: External ear normal.  Left Ear: External ear normal.  Nose: Nose normal. No mucosal edema or rhinorrhea.  Mouth/Throat: Oropharynx is clear and moist and mucous membranes are normal. No dental abscesses or uvula swelling.  Eyes: Conjunctivae and EOM are normal. Pupils are equal, round, and reactive to light.  Neck: Normal range of motion and full passive range of motion without pain. Neck supple.  Cardiovascular: Normal rate, regular rhythm and normal heart sounds.  Exam reveals no gallop and no friction  rub.   No murmur heard. Pulmonary/Chest: Effort normal. No respiratory distress. He has decreased breath sounds. He has wheezes. He has no rhonchi. He has no rales. He exhibits no tenderness and no crepitus.  Abdominal: Soft. Normal appearance and bowel sounds are normal. He exhibits no distension. There is no tenderness. There is no rebound and no guarding.  Musculoskeletal: Normal range of motion. He exhibits no edema and no tenderness.  Moves all extremities well.   Neurological: He is alert and oriented to person, place, and  time. He has normal strength. No cranial nerve deficit.  Skin: Skin is warm, dry and intact. No rash noted. No erythema. No pallor.  Psychiatric: He has a normal mood and affect. His speech is normal and behavior is normal. His mood appears not anxious.    ED Course  Procedures (including critical care time)  Medications  levofloxacin (LEVAQUIN) IVPB 500 mg (500 mg Intravenous New Bag/Given 04/23/14 1750)  methylPREDNISolone sodium succinate (SOLU-MEDROL) 125 mg/2 mL injection 125 mg (125 mg Intravenous Given 04/23/14 1750)  albuterol (PROVENTIL,VENTOLIN) solution continuous neb (15 mg/hr Nebulization Given 04/23/14 1730)  ipratropium (ATROVENT) nebulizer solution 0.5 mg (0.5 mg Nebulization Given 04/23/14 1730)  sodium chloride 0.9 % bolus 1,000 mL (0 mLs Intravenous Stopped 04/23/14 1750)   Patient presents via EMS with oxygen by nasal cannula. He does not have any documented hypoxia in the ED however he has been remained on his nasal cannula. ABG was done to rule out hypercarbia as the etiology of his weakness.  Recheck 17:40 Pt states he is breathing easier.  Lungs show improved air movement and no wheezing.  Discussed his test results, pt is agreeable to be admitted.   18:05 Dr Geni Bers, admit to tele, team 2   Labs Review Results for orders placed during the hospital encounter of 04/23/14  CBC WITH DIFFERENTIAL      Result Value Ref Range   WBC 20.6 (*) 4.0 - 10.5 K/uL   RBC 4.23  4.22 - 5.81 MIL/uL   Hemoglobin 12.7 (*) 13.0 - 17.0 g/dL   HCT 37.6 (*) 39.0 - 52.0 %   MCV 88.9  78.0 - 100.0 fL   MCH 30.0  26.0 - 34.0 pg   MCHC 33.8  30.0 - 36.0 g/dL   RDW 14.7  11.5 - 15.5 %   Platelets 276  150 - 400 K/uL   Neutrophils Relative % 88 (*) 43 - 77 %   Neutro Abs 17.9 (*) 1.7 - 7.7 K/uL   Lymphocytes Relative 7 (*) 12 - 46 %   Lymphs Abs 1.5  0.7 - 4.0 K/uL   Monocytes Relative 5  3 - 12 %   Monocytes Absolute 1.1 (*) 0.1 - 1.0 K/uL   Eosinophils Relative 0  0 - 5 %   Eosinophils  Absolute 0.0  0.0 - 0.7 K/uL   Basophils Relative 0  0 - 1 %   Basophils Absolute 0.0  0.0 - 0.1 K/uL  COMPREHENSIVE METABOLIC PANEL      Result Value Ref Range   Sodium 137  137 - 147 mEq/L   Potassium 4.0  3.7 - 5.3 mEq/L   Chloride 97  96 - 112 mEq/L   CO2 30  19 - 32 mEq/L   Glucose, Bld 125 (*) 70 - 99 mg/dL   BUN 12  6 - 23 mg/dL   Creatinine, Ser 0.61  0.50 - 1.35 mg/dL   Calcium 8.6  8.4 - 10.5 mg/dL  Total Protein 5.7 (*) 6.0 - 8.3 g/dL   Albumin 3.0 (*) 3.5 - 5.2 g/dL   AST 31  0 - 37 U/L   ALT 13  0 - 53 U/L   Alkaline Phosphatase 95  39 - 117 U/L   Total Bilirubin 0.6  0.3 - 1.2 mg/dL   GFR calc non Af Amer >90  >90 mL/min   GFR calc Af Amer >90  >90 mL/min  BLOOD GAS, ARTERIAL      Result Value Ref Range   FIO2 0.28     Delivery systems NASAL CANNULA     pH, Arterial 7.390  7.350 - 7.450   pCO2 arterial 50.6 (*) 35.0 - 45.0 mmHg   pO2, Arterial 64.3 (*) 80.0 - 100.0 mmHg   Bicarbonate 30.0 (*) 20.0 - 24.0 mEq/L   TCO2 26.9  0 - 100 mmol/L   Acid-Base Excess 5.2 (*) 0.0 - 2.0 mmol/L   O2 Saturation 92.3     Patient temperature 37.0     Collection site RIGHT RADIAL     Drawn by [phone]     Sample type ARTERIAL DRAW     Allens test (pass/fail) PASS  PASS     Laboratory interpretation all normal except for leukocytosis, compensated respiratory acidosis    Imaging Review Dg Chest 2 View  04/23/2014   CLINICAL DATA:  Shortness of breath and fatigue  EXAM: CHEST  2 VIEW  COMPARISON:  Mar 28, 2013  FINDINGS: There is underlying emphysematous change. There is interstitial prominence in the right lower lung zones suspicious for interstitial infiltrate. There is no airspace consolidation. The heart size is within normal limits. The pulmonary vascularity reflects underlying emphysematous change. No appreciable adenopathy. No bone lesions.  IMPRESSION: Underlying emphysema. Right lower lobe interstitial infiltrate. No airspace consolidation.   Electronically Signed   By:  Lowella Grip M.D.   On: 04/23/2014 16:59     EKG Interpretation   Date/Time:  Saturday April 23 2014 15:41:11 EDT Ventricular Rate:  83 PR Interval:  137 QRS Duration: 127 QT Interval:  396 QTC Calculation: 465 R Axis:   98 Text Interpretation:  Age not entered, assumed to be  58 years old for  purpose of ECG interpretation Sinus rhythm RBBB and LPFB No significant  change since last tracing Confirmed by Harla Mensch  MD-I, Shatima Zalar ([id number]) on  04/23/2014 5:52:35 PM      MDM   Final diagnoses:  Excessive sleepiness  Community acquired pneumonia   Plan admission   Rolland Peabody, MD, Alanson Aly, MD 04/23/14 2123

## 2014-04-23 NOTE — H&P (Signed)
History and Physical:    Garrett Klein:[id number] DOB: February 07, 1956 DOA: 04/23/2014  Referring physician: Dr. Eulis Foster PCP:  [place]   Chief Complaint: Sleepy  History of Present Illness:   Garrett Klein is an 58 y.o. male with a known past medical history for COPD with active tobacco use,  Arthritis, multiple surgical interventions the most recent in March for left Hip replacement.  Patient reports 2-3 days malaise and sleepy.  States has had congestion and wheezing and was using his nebulizer. No reported fevers, but did not have appetite. Was bringing up green mucous.  In ER Chest xray consistent with right lower lobe pneumonia.  He is just shy of the 90 day window post op and,therefore will be treated with HCAP.  ROS:   Constitutional: No fever, no chills;  Appetite decreased; No weight loss, no weight gain, Positive fatigue/falling asleep.  HEENT: No blurry vision, no diplopia, no pharyngitis, no dysphagia CV: No chest pain, no palpitations, no PND, no orthopnea, no edema.  Resp: No SOB,  Positive cough, no pleuritic pain. GI: No nausea, no vomiting, no diarrhea, no melena, no hematochezia, no constipation, no abdominal pain.  GU: No dysuria, no hematuria, no frequency, no urgency. MSK: no myalgias, no arthralgias.  Neuro:  No headache, no focal neurological deficits, no history of seizures.  Psych: No depression, chronic anxiety.  Endo: No heat intolerance, no cold intolerance, no polyuria, no polydipsia  Skin: No rashes, no skin lesions.  Heme: No easy bruising.  Travel history: No recent travel.   Past Medical History:   Past Medical History  Diagnosis Date  . COPD (chronic obstructive pulmonary disease)   . High cholesterol   . Chronic lower back pain   . H/O rotator cuff surgery Nov 2014    Right  . Status post hip surgery March 11,2015    Left replacment  . Previous back surgery     x 2  . Bursitis of elbow unknown, well healed    drained    Past Surgical  History:   Past Surgical History  Procedure Laterality Date  . Back surgery    . Joint replacement      left hip surgery - January 26, 2014    Social History:   History   Social History  . Marital Status: Married    Spouse Name: N/A    Number of Children: N/A  . Years of Education: N/A   Occupational History  . Truck driver   Social History Main Topics  . Smoking status: Current Every Day Smoker -- 1.00 packs/day  . Smokeless tobacco: Not on file  . Alcohol Use: No  . Drug Use: No  . Sexual Activity:    Other Topics Concern  . Not on file   Social History Narrative  . Married, worked as a Administrator    Family history:   Mom died of uterine cancer , and Dad died of MI.   One sister living and active in his care.  Allergies   Bee venom; Hydrocodone; Penicillins; and Sulfa antibiotics  Current Medications:   Prior to Admission medications   Medication Sig Start Date End Date Taking? Authorizing Provider  albuterol (PROVENTIL HFA;VENTOLIN HFA) 108 (90 BASE) MCG/ACT inhaler Inhale 2 puffs into the lungs every 6 (six) hours as needed for wheezing or shortness of breath.   Yes Historical Provider, MD  ALPRAZolam [name]) 1 MG tablet Take 1 mg by mouth 3 (three)  times daily as needed. For anxiety   Yes Historical Provider, MD  Aspirin-Acetaminophen-Caffeine (GOODYS EXTRA STRENGTH PO) Take 1 Package by mouth as needed. For pain   Yes Historical Provider, MD  glucosamine-chondroitin 500-400 MG tablet Take 3 tablets by mouth daily.     Yes Historical Provider, MD  diclofenac (VOLTAREN) 50 MG EC tablet Take 50 mg by mouth 2 (two) times daily.    Historical Provider, MD  diphenoxylate-atropine (LOMOTIL) 2.5-0.025 MG per tablet Take 2 tablets every 4 hours as long as you have diarrhea. 03/28/13   Mylinda Latina III, MD  GABAPENTIN PO Take 1 capsule by mouth 3 (three) times daily.    Historical Provider, MD    Physical Exam:   Filed Vitals:   04/23/14 1733 04/23/14 1751  04/23/14 1800 04/23/14 1830  BP:  113/62 120/67 113/63  Pulse:  82 90 95  Temp:      TempSrc:      Resp:  18 16   Height:      Weight:      SpO2: 94% 99% 94% 97%     Physical Exam: Blood pressure 113/63, pulse 95, temperature 99.3 F (37.4 C), temperature source Oral, resp. rate 16, height 5\' 10"  (1.778 m), weight 76.658 kg (169 lb), SpO2 97.00%. Gen: No acute distress. No use of accessory muscles, completing sentances Head: Normocephalic, atraumatic. Eyes: PERRL, EOMI, sclerae nonicteric. Mouth: Oropharynx dry , edentulous, no thrush or exudates, minimally red mucous membranes Neck: Supple, no thyromegaly, no lymphadenopathy, no jugular venous distention. Chest: Lungs, some faint end expiratory wheezing (receiving treatment), right basilar rhonchi, distant breath sounds  CV: Heart sounds regular, S1, S2 no S3 or S4, no MRG Abdomen: Soft, nontender, nondistended with normal active bowel sounds. Extremities: Extremities, trace edema over ankles not pitting. Left lateral leg tender not erythematous or swollen Skin: Warm and dry. Neuro: Alert and oriented times 3; cranial nerves II through XII grossly intact. Psych: Mood and affect normal.   Data Review:    Labs: Basic Metabolic Panel:  Recent Labs Lab 04/23/14 1545  NA 137  K 4.0  CL 97  CO2 30  GLUCOSE 125*  BUN 12  CREATININE 0.61  CALCIUM 8.6   Liver Function Tests:  Recent Labs Lab 04/23/14 1545  AST 31  ALT 13  ALKPHOS 95  BILITOT 0.6  PROT 5.7*  ALBUMIN 3.0*     CBC:  Recent Labs Lab 04/23/14 1545  WBC 20.6*  NEUTROABS 17.9*  HGB 12.7*  HCT 37.6*  MCV 88.9  PLT 276      Radiographic Studies: Dg Chest 2 View  04/23/2014   CLINICAL DATA:  Shortness of breath and fatigue  EXAM: CHEST  2 VIEW  COMPARISON:  Mar 28, 2013  FINDINGS: There is underlying emphysematous change. There is interstitial prominence in the right lower lung zones suspicious for interstitial infiltrate. There is no  airspace consolidation. The heart size is within normal limits. The pulmonary vascularity reflects underlying emphysematous change. No appreciable adenopathy. No bone lesions.  IMPRESSION: Underlying emphysema. Right lower lobe interstitial infiltrate. No airspace consolidation.   Electronically Signed   By: Lowella Grip M.D.   On: 04/23/2014 16:59    EKG: Independently reviewed. Sinus with RBBB and LFB. RA enlargment   Assessment/Plan:   Active Problems:  1.  CAP (community acquired pneumonia) likely but just at the end of the 37 day window for hospital stay related to hip replacement .  I , therefore agree with treating as  a HCAP. Vanc and Levaquin due to severe PCN allergy.   2. COPD (chronic obstructive pulmonary disease)with possible exacerbation in face of pneumonia: still smoking counseled regarding cessation and would recommend formal smoking cessation counseling if available. Will order scheduled and prn nebs, may need steroids if still wheezing in the am. Just had treatment and not wheezing at present. Sats in mid 90's on 2L was placed on prior to arrival to hospital with no recorded hypoxemia, ABG shows compensated 50 PCO2. Cautious use of supplemental oxygen ok. Guaifenesin for cough, and flutter valve.  3.Tobacco abuse: nicotine patch if patient desires. Cessation counseling.  4. Will need accurate home medication list .  Will contact spouse via phone.   5.  Arthritis: Need home med list.  Diclofenac listed.  When asked what he used for pain he states "morphine".  When clarified what he uses at home for pain he states Motrin.  Review home meds.  6.  Patient reports still having trouble going up stairs and having pain in left lateral leg- check doppers of lower extremities. Low suspicion for DVT but is post op.  PT eval and treat.  Please attempt to contact [facility] for medical records asap.  DVT prophylaxis: lovenox will be ordered.  Patient recently stopped some form of  "blood thinner" post hip replacement can't tell me when stopped or what he was taking.  Code Status: Full. Family Communication:  Sister at bedside, who spoke with wife via phone requested bring med list.  Disposition Plan: Home when stable.  Time spent:  620 pm-710 pm  [facility] Pager [phone]  If 7PM-7AM, please contact night-coverage [email or web address] Password [facility] 04/23/2014, 6:44 PM    Attempted to communicate with spouse on the phone with Mr. Mantel regarding Med List .  She is unable to find the list to review and states she will bring it tomorrow. Please obtain and review chronic medications.  Whitfield Dulay L. Lovena Le, MD MBA The Palliative Medicine Team at [facility] Phone: [phone] Pager: [phone]

## 2014-04-23 NOTE — ED Notes (Signed)
Per EMS they were call out for the pt not responding, states pt responded to verbal stimuli upon arrival. Pt states he can't seem to stay awake lately and is very fatigued. Pt also reports some SHOB that started yesterday.

## 2014-04-24 ENCOUNTER — Inpatient Hospital Stay (HOSPITAL_COMMUNITY): Payer: Non-veteran care

## 2014-04-24 DIAGNOSIS — J13 Pneumonia due to Streptococcus pneumoniae: Secondary | ICD-10-CM | POA: Diagnosis not present

## 2014-04-24 DIAGNOSIS — F17200 Nicotine dependence, unspecified, uncomplicated: Secondary | ICD-10-CM

## 2014-04-24 DIAGNOSIS — J449 Chronic obstructive pulmonary disease, unspecified: Secondary | ICD-10-CM

## 2014-04-24 DIAGNOSIS — J189 Pneumonia, unspecified organism: Secondary | ICD-10-CM

## 2014-04-24 DIAGNOSIS — J9600 Acute respiratory failure, unspecified whether with hypoxia or hypercapnia: Secondary | ICD-10-CM

## 2014-04-24 LAB — BASIC METABOLIC PANEL
BUN: 12 mg/dL (ref 6–23)
CALCIUM: 8.7 mg/dL (ref 8.4–10.5)
CO2: 27 mEq/L (ref 19–32)
CREATININE: 0.5 mg/dL (ref 0.50–1.35)
Chloride: 98 mEq/L (ref 96–112)
GFR calc Af Amer: >90 mL/min (ref >90)
Glucose, Bld: 188 mg/dL — ABNORMAL HIGH (ref 70–99)
POTASSIUM: 3.4 meq/L — AB (ref 3.7–5.3)
Sodium: 138 mEq/L (ref 137–147)

## 2014-04-24 LAB — CBC
HEMATOCRIT: 36 % — AB (ref 39.0–52.0)
Hemoglobin: 12 g/dL — ABNORMAL LOW (ref 13.0–17.0)
MCH: 29.7 pg (ref 26.0–34.0)
MCHC: 33.3 g/dL (ref 30.0–36.0)
MCV: 89.1 fL (ref 78.0–100.0)
Platelets: 276 10*3/uL (ref 150–400)
RBC: 4.04 MIL/uL — ABNORMAL LOW (ref 4.22–5.81)
RDW: 14.5 % (ref 11.5–15.5)
WBC: 12.9 10*3/uL — ABNORMAL HIGH (ref 4.0–10.5)

## 2014-04-24 LAB — MRSA PCR SCREENING: MRSA by PCR: NEGATIVE

## 2014-04-24 LAB — STREP PNEUMONIAE URINARY ANTIGEN: STREP PNEUMO URINARY ANTIGEN: POSITIVE — AB

## 2014-04-24 MED ORDER — IPRATROPIUM-ALBUTEROL 0.5-2.5 (3) MG/3ML IN SOLN
3.0000 mL | Freq: Three times a day (TID) | RESPIRATORY_TRACT | Status: DC
  Administered 2014-04-24 – 2014-04-25 (×3): 3 mL via RESPIRATORY_TRACT
  Filled 2014-04-24 (×3): qty 3

## 2014-04-24 MED ORDER — BIOTENE DRY MOUTH MT LIQD
15.0000 mL | Freq: Two times a day (BID) | OROMUCOSAL | Status: DC
  Administered 2014-04-24 (×2): 15 mL via OROMUCOSAL

## 2014-04-24 MED ORDER — POTASSIUM CHLORIDE CRYS ER 20 MEQ PO TBCR
40.0000 meq | EXTENDED_RELEASE_TABLET | Freq: Once | ORAL | Status: AC
  Administered 2014-04-24: 40 meq via ORAL
  Filled 2014-04-24: qty 2

## 2014-04-24 NOTE — Progress Notes (Signed)
Utilization review Completed Favian Kittleson RN BSN   

## 2014-04-24 NOTE — Progress Notes (Signed)
[facility] PROGRESS NOTE  Garrett Klein ZYS:[id number] DOB: 1955-11-30 DOA: 04/23/2014 PCP: Lamona Curl FREE  Assessment/Plan: 1. Acute respiratory failure, wean off oxygen as tolerated.  Will try and ambulate patient today 2. COPD.  Continue bronchodilators, antibiotics.  He is no longer wheezing and does not need steroids at this time. 3. HCAP. On vancomycin and levaquin.  Clinically appears to be improving.  Will continue to follow.  Add flutter valve. Can likely de escalate tomorrow if continues to improve. 4. Tobacco abuse. Counseled on the importance of cessation 5. Lower extremity edema. Venous dopplers negative for DVT.  May be related to venous insufficiency. May benefit from compression hose.  Code Status: full code Family Communication: discussed with patient and wife at the bedside Disposition Plan: discharge home once improved, likely in next 24-48 hours.   Consultants:    Procedures:    Antibiotics:  Vancomycin 6/6  levaquin 6/6  HPI/Subjective: Feeling better. Has productive cough  Objective: Filed Vitals:   04/24/14 1425  BP: 132/73  Pulse: 98  Temp: 97.4 F (36.3 C)  Resp: 18    Intake/Output Summary (Last 24 hours) at 04/24/14 1619 Last data filed at 04/24/14 1200  Gross per 24 hour  Intake    630 ml  Output   2350 ml  Net  -1720 ml   Filed Weights   04/23/14 1541  Weight: 76.658 kg (169 lb)    Exam:   General:  NAD  Cardiovascular: coarse breath sounds at bases  Respiratory: s1 s2 tachy  Abdomen: soft, nt, nd, bs+  Musculoskeletal: trace edema b/l   Data Reviewed: Basic Metabolic Panel:  Recent Labs Lab 04/23/14 1545 04/24/14 0532  NA 137 138  K 4.0 3.4*  CL 97 98  CO2 30 27  GLUCOSE 125* 188*  BUN 12 12  CREATININE 0.61 0.50  CALCIUM 8.6 8.7   Liver Function Tests:  Recent Labs Lab 04/23/14 1545  AST 31  ALT 13  ALKPHOS 95  BILITOT 0.6  PROT 5.7*  ALBUMIN 3.0*   No results found for this  basename: LIPASE, AMYLASE,  in the last 168 hours No results found for this basename: AMMONIA,  in the last 168 hours CBC:  Recent Labs Lab 04/23/14 1545 04/24/14 0532  WBC 20.6* 12.9*  NEUTROABS 17.9*  --   HGB 12.7* 12.0*  HCT 37.6* 36.0*  MCV 88.9 89.1  PLT 276 276   Cardiac Enzymes: No results found for this basename: CKTOTAL, CKMB, CKMBINDEX, TROPONINI,  in the last 168 hours BNP (last 3 results) No results found for this basename: PROBNP,  in the last 8760 hours CBG: No results found for this basename: GLUCAP,  in the last 168 hours  Recent Results (from the past 240 hour(s))  CULTURE, BLOOD (ROUTINE X 2)     Status: None   Collection Time    04/23/14  5:42 PM      Result Value Ref Range Status   Specimen Description LEFT ANTECUBITAL   Final   Special Requests BOTTLES DRAWN AEROBIC AND ANAEROBIC 10CC   Final   Culture NO GROWTH 1 DAY   Final   Report Status PENDING   Incomplete  CULTURE, BLOOD (ROUTINE X 2)     Status: None   Collection Time    04/23/14  5:42 PM      Result Value Ref Range Status   Specimen Description BLOOD RIGHT HAND   Final   Special Requests BOTTLES DRAWN AEROBIC ONLY [place]  Final   Culture NO GROWTH 1 DAY   Final   Report Status PENDING   Incomplete  MRSA PCR SCREENING     Status: None   Collection Time    04/23/14  8:10 PM      Result Value Ref Range Status   MRSA by PCR NEGATIVE  NEGATIVE Final   Comment:            The GeneXpert MRSA Assay (FDA     approved for NASAL specimens     only), is one component of a     comprehensive MRSA colonization     surveillance program. It is not     intended to diagnose MRSA     infection nor to guide or     monitor treatment for     MRSA infections.     Studies: Dg Chest 2 View  04/23/2014   CLINICAL DATA:  Shortness of breath and fatigue  EXAM: CHEST  2 VIEW  COMPARISON:  Mar 28, 2013  FINDINGS: There is underlying emphysematous change. There is interstitial prominence in the right lower lung  zones suspicious for interstitial infiltrate. There is no airspace consolidation. The heart size is within normal limits. The pulmonary vascularity reflects underlying emphysematous change. No appreciable adenopathy. No bone lesions.  IMPRESSION: Underlying emphysema. Right lower lobe interstitial infiltrate. No airspace consolidation.   Electronically Signed   By: Lowella Grip M.D.   On: 04/23/2014 16:59   US Venous Img Lower Bilateral  04/24/2014   CLINICAL DATA:  Bilateral leg pain.  EXAM: BILATERAL LOWER EXTREMITY VENOUS DOPPLER ULTRASOUND  TECHNIQUE: Gray-scale sonography with graded compression, as well as color Doppler and duplex ultrasound were performed to evaluate the lower extremity deep venous systems from the level of the common femoral vein and including the common femoral, femoral, profunda femoral, popliteal and calf veins including the posterior tibial, peroneal and gastrocnemius veins when visible. The superficial great saphenous vein was also interrogated. Spectral Doppler was utilized to evaluate flow at rest and with distal augmentation maneuvers in the common femoral, femoral and popliteal veins.  COMPARISON:  No priors.  FINDINGS: RIGHT LOWER EXTREMITY  Common Femoral Vein: No evidence of thrombus. Normal compressibility, respiratory phasicity and response to augmentation.  Saphenofemoral Junction: No evidence of thrombus. Normal compressibility and flow on color Doppler imaging.  Profunda Femoral Vein: No evidence of thrombus. Normal compressibility and flow on color Doppler imaging.  Femoral Vein: No evidence of thrombus. Normal compressibility, respiratory phasicity and response to augmentation.  Popliteal Vein: No evidence of thrombus. Normal compressibility, respiratory phasicity and response to augmentation.  Calf Veins: No evidence of thrombus. Normal compressibility and flow on color Doppler imaging.  Superficial Great Saphenous Vein: No evidence of thrombus. Normal  compressibility and flow on color Doppler imaging.  Venous Reflux:  None.  Other Findings:  None.  LEFT LOWER EXTREMITY  Common Femoral Vein: No evidence of thrombus. Normal compressibility, respiratory phasicity and response to augmentation.  Saphenofemoral Junction: No evidence of thrombus. Normal compressibility and flow on color Doppler imaging.  Profunda Femoral Vein: No evidence of thrombus. Normal compressibility and flow on color Doppler imaging.  Femoral Vein: No evidence of thrombus. Normal compressibility, respiratory phasicity and response to augmentation.  Popliteal Vein: No evidence of thrombus. Normal compressibility, respiratory phasicity and response to augmentation.  Calf Veins: No evidence of thrombus. Normal compressibility and flow on color Doppler imaging.  Superficial Great Saphenous Vein: No evidence of thrombus. Normal compressibility and flow on  color Doppler imaging.  Venous Reflux:  None.  Other Findings:  None.  IMPRESSION: No evidence of deep venous thrombosis in either of the lower extremities.   Electronically Signed   By: Vinnie Langton M.D.   On: 04/24/2014 13:31    Scheduled Meds: . antiseptic oral rinse  15 mL Mouth Rinse BID  . enoxaparin (LOVENOX) injection  40 mg Subcutaneous Q24H  . guaiFENesin  1,200 mg Oral BID  . ipratropium-albuterol  3 mL Nebulization TID  . levofloxacin (LEVAQUIN) IV  750 mg Intravenous Q24H  . nicotine  14 mg Transdermal Daily  . potassium chloride  40 mEq Oral Once  . vancomycin  1,000 mg Intravenous Q8H   Continuous Infusions: . sodium chloride 50 mL/hr at 04/23/14 2007    Active Problems:   HCAP (healthcare-associated pneumonia)   COPD (chronic obstructive pulmonary disease)   Tobacco abuse   Pneumonia   Bursitis of elbow    Time spent: 38mins    Jehanzeb Memon  [facility] Pager [phone]. If 7PM-7AM, please contact night-coverage at [email or web address], password [facility] 04/24/2014, 4:19 PM  LOS: 1 day

## 2014-04-25 DIAGNOSIS — J13 Pneumonia due to Streptococcus pneumoniae: Principal | ICD-10-CM

## 2014-04-25 DIAGNOSIS — G4710 Hypersomnia, unspecified: Secondary | ICD-10-CM

## 2014-04-25 LAB — BASIC METABOLIC PANEL
BUN: 12 mg/dL (ref 6–23)
CALCIUM: 8.7 mg/dL (ref 8.4–10.5)
CHLORIDE: 105 meq/L (ref 96–112)
CO2: 29 mEq/L (ref 19–32)
CREATININE: 0.61 mg/dL (ref 0.50–1.35)
GFR calc non Af Amer: >90 mL/min (ref >90)
Glucose, Bld: 108 mg/dL — ABNORMAL HIGH (ref 70–99)
Potassium: 4.1 mEq/L (ref 3.7–5.3)
Sodium: 144 mEq/L (ref 137–147)

## 2014-04-25 LAB — CBC
HCT: 35.6 % — ABNORMAL LOW (ref 39.0–52.0)
Hemoglobin: 12 g/dL — ABNORMAL LOW (ref 13.0–17.0)
MCH: 29.9 pg (ref 26.0–34.0)
MCHC: 33.7 g/dL (ref 30.0–36.0)
MCV: 88.6 fL (ref 78.0–100.0)
PLATELETS: 308 10*3/uL (ref 150–400)
RBC: 4.02 MIL/uL — ABNORMAL LOW (ref 4.22–5.81)
RDW: 14.6 % (ref 11.5–15.5)
WBC: 18.3 10*3/uL — ABNORMAL HIGH (ref 4.0–10.5)

## 2014-04-25 LAB — LEGIONELLA ANTIGEN, URINE: LEGIONELLA ANTIGEN, URINE: NEGATIVE

## 2014-04-25 MED ORDER — GUAIFENESIN ER 600 MG PO TB12: 1200.0000 mg | ORAL_TABLET | Freq: Two times a day (BID) | ORAL | Status: DC

## 2014-04-25 MED ORDER — LEVOFLOXACIN 750 MG PO TABS: 750.0000 mg | ORAL_TABLET | Freq: Every day | ORAL | Status: DC

## 2014-04-25 MED ORDER — ALBUTEROL SULFATE (2.5 MG/3ML) 0.083% IN NEBU: 2.5000 mg | INHALATION_SOLUTION | RESPIRATORY_TRACT | Status: DC | PRN

## 2014-04-25 MED ORDER — IPRATROPIUM-ALBUTEROL 0.5-2.5 (3) MG/3ML IN SOLN: 3.0000 mL | Freq: Two times a day (BID) | RESPIRATORY_TRACT | Status: DC

## 2014-04-25 NOTE — Progress Notes (Signed)
Patient being d/c home with prescriptions. IV cath removed and intact. Verbalizes understanding of instructions. No pain/swelling at site. Family awaiting at bedside. No pain/swelling at site.

## 2014-04-25 NOTE — Care Management Note (Signed)
    Page 1 of 1   04/25/2014     4:16:25 PM CARE MANAGEMENT NOTE 04/25/2014  Patient:  SAXON, BARICH   Account Number:  [id number]  Date Initiated:  04/25/2014  Documentation initiated by:  Claretha Cooper  Subjective/Objective Assessment:   Pt is a veteran followed by the [employer]. Transfer election made and faxed to [place].     Action/Plan:   Anticipated DC Date:  04/25/2014   Anticipated DC Plan:  [place]  CM consult      Choice offered to / List presented to:             Status of service:  Completed, signed off Medicare Important Message given?   (If response is "NO", the following Medicare IM given date fields will be blank) Date Medicare IM given:   Date Additional Medicare IM given:    Discharge Disposition:    Per UR Regulation:    If discussed at Long Length of Stay Meetings, dates discussed:    Comments:  04/25/14 Claretha Cooper RN CM

## 2014-04-25 NOTE — Clinical Social Work Note (Signed)
CSW received referral for advance directive information. Met with pt who accepted packet of information. Pt states he would like to review and is aware to contact CSW if interested in completing in hospital.   Benay Pike, [place]

## 2014-04-25 NOTE — Evaluation (Signed)
Physical Therapy Evaluation Patient Details Name: Garrett Klein MRN: [id number] DOB: 1956/08/06 Today's Date: 04/25/2014   History of Present Illness  Garrett Klein is an 58 y.o. male with a known past medical history for COPD with active tobacco use,  Arthritis, multiple surgical interventions the most recent in March for left Hip replacement.  Patient reports 2-3 days malaise and sleepy.  States has had congestion and wheezing and was using his nebulizer. No reported fevers, but did not have appetite. Was bringing up green mucous.  In ER Chest xray consistent with right lower lobe pneumonia.  He is just shy of the 90 day window post op and,therefore will be treated with HCAP.  Clinical Impression  Patient presents to physical therapy from MD referral for mobility skills assessment.  Patient underwent THA to (L) hip 3/ 09/2014, and received skilled HHPT and OPPT services to complete rehab.  Patient does report occasional pain to (L) calf, however no pain reported at this time.  Advised patient to follow up with MD at [facility], who performed the THA regarding occasional (L) calf pain.  During evaluation, the patient was (I) with bed mobility skills, mod (I) with transfers with use of personal standard cane, and was able to ambulate 250 feet and ascend/descend stairs with use of (B) handrails without LOB or rest breaks.  No further PT is recommended at this time as patient is at prior level of function.  No DME recommended as patient has a standard cane, raised toilet seat, grab bars, and shower seat in the home.      Follow Up Recommendations No PT follow up    Equipment Recommendations  Other (comment) (Patient has all DME needs)       Precautions / Restrictions Precautions Precautions: None Restrictions Weight Bearing Restrictions: No      Mobility  Bed Mobility Overal bed mobility: Independent                Transfers Overall transfer level: Modified independent Equipment used:  Straight cane                Ambulation/Gait Ambulation/Gait assistance: Modified independent (Device/Increase time) Ambulation Distance (Feet): 250 Feet Assistive device: Straight cane Gait Pattern/deviations: WFL(Within Functional Limits);Step-through pattern   Gait velocity interpretation: at or above normal speed for age/gender    Stairs Stairs: Yes Stairs assistance: Modified independent (Device/Increase time) Stair Management: Two rails Number of Stairs: 2 General stair comments: Step through gait pattern for ascend/descending stairs     Balance Overall balance assessment: Modified Independent (No hx of falls in past year.  Uses cane for standing balance activity.  Able to maintain seated balance against moderate resistance with (B) LE support on floor and without UE support)                                           Pertinent Vitals/Pain No pain reported.     Home Living Family/patient expects to be discharged to:: Private residence Living Arrangements: Spouse/significant other Available Help at Discharge: Family Type of Home: House Home Access: Stairs to enter;Ramped entrance (Stairs to enter front door, ramp at back door) Entrance Stairs-Rails: Can reach both Entrance Stairs-Number of Steps: [place]: One level [place]: Shower seat;Electric scooter;Grab bars - tub/shower;Cane - single point      Prior Function Level of Independence: Independent with assistive device(s)  Comments: Use of motorized scooter for community ambulation since 11/2012, standard cane as needed since sx to (L) hip 01/26/2014     Hand Dominance   Dominant Hand: Right    Extremity/Trunk Assessment               Lower Extremity Assessment: Overall WFL for tasks assessed (MMT (B) hip/knees 4+/5)         Communication   Communication: No difficulties  Cognition Arousal/Alertness: Awake/alert Behavior During Therapy: WFL for tasks  assessed/performed                                 Assessment/Plan    PT Assessment Patent does not need any further PT services  PT Diagnosis     PT Problem List    PT Treatment Interventions     PT Goals (Current goals can be found in the Care Plan section) Acute Rehab PT Goals PT Goal Formulation: No goals set, d/c therapy     End of Session Equipment Utilized During Treatment: Gait belt Activity Tolerance: Patient tolerated treatment well Patient left: in bed;with call bell/phone within reach           Time: 0835-0855 PT Time Calculation (min): 20 min   Charges:   PT Evaluation $Initial PT Evaluation Tier I: 1 Procedure      Lonna Cobb 04/25/2014, 9:06 AM

## 2014-04-25 NOTE — Discharge Summary (Signed)
Physician Discharge Summary  Garrett Klein:[id number] DOB: [birth year] DOA: 04/23/2014  PCP: Garrett Klein  Admit date: 04/23/2014 Discharge date: 04/25/2014  Time spent: 40 minutes  Recommendations for Outpatient Follow-up:  1. Follow up with primary care doctor in 1 week 2. Repeat CBC in 1 week to evaluate leukocytosis  Discharge Diagnoses:  Active Problems:   COPD (chronic obstructive pulmonary disease)   Tobacco abuse   Pneumococcal pneumonia   Bursitis of elbow Leukocytosis hypokalemia  Discharge Condition: improved  Diet recommendation: low salt  Filed Weights   04/23/14 1541  Weight: 76.658 kg (169 lb)    History of present illness:  Garrett Klein is an 58 y.o. male with a known past medical history for COPD with active tobacco use, Arthritis, multiple surgical interventions the most recent in March for left Hip replacement. Patient reports 2-3 days malaise and sleepy. States has had congestion and wheezing and was using his nebulizer. No reported fevers, but did not have appetite. Was bringing up green mucous. In ER Chest xray consistent with right lower lobe pneumonia. He is just shy of the 90 day window post op and,therefore will be treated with HCAP.   Hospital Course:  This patient was as the hospital with progressive lethargy. He was found to have pneumonia on chest x-ray. Urinary antigens are positive for pneumococcus. He was started empirically on vancomycin and Levaquin. The patient initially improved. He does have a history of COPD and received one dose of steroids in the emergency room. Patient is continued on antibiotics, bronchodilators. He quickly improved and now is breathing comfortably on room air. Patient is ambulating without difficulty, maintaining oxygen saturations between 96-100%. Initially, using a leukocytosis of 20,000, following this dropped down to 12,900. The next day, he was noted to be elevated again at 18,000. Patient has been  afebrile. He does not appear septic or toxic. He's not had any diarrhea, significant pain, vomiting, abdominal pain or any other complaints. He actually feels quite well and is anxious to discharge home. We'll plan on transitioning the patient to oral Levaquin for another 5 days. He is requested to followup with his primary care physician within the week and have a repeat CBC to evaluate leukocytosis. The remainder of his lab work is unremarkable and clinically he appears to be doing very well. He'll be discharged home today.  Procedures:    Consultations:    Discharge Exam: Filed Vitals:   04/25/14 1039  BP:   Pulse: 89  Temp:   Resp: 18    General: NAD Cardiovascular: S1, S2 RRR Respiratory: CTA B  Discharge Instructions You were cared for by a hospitalist during your hospital stay. If you have any questions about your discharge medications or the care you received while you were in the hospital after you are discharged, you can call the unit and asked to speak with the hospitalist on call if the hospitalist that took care of you is not available. Once you are discharged, your primary care physician will handle any further medical issues. Please note that NO REFILLS for any discharge medications will be authorized once you are discharged, as it is imperative that you return to your primary care physician (or establish a relationship with a primary care physician if you do not have one) for your aftercare needs so that they can reassess your need for medications and monitor your lab values.  Discharge Instructions   Call Klein for:  difficulty breathing, headache or visual  disturbances    Complete by:  As directed      Call Klein for:  severe uncontrolled pain    Complete by:  As directed      Call Klein for:  temperature >100.4    Complete by:  As directed      Diet - low sodium heart healthy    Complete by:  As directed      Increase activity slowly    Complete by:  As directed              Medication List    STOP taking these medications       diclofenac 50 MG EC tablet  Commonly known as:  VOLTAREN     GOODYS EXTRA STRENGTH PO      TAKE these medications       albuterol 108 (90 BASE) MCG/ACT inhaler  Commonly known as:  PROVENTIL HFA;VENTOLIN HFA  Inhale 2 puffs into the lungs every 6 (six) hours as needed for wheezing or shortness of breath.     ALPRAZolam 1 MG tablet  Commonly known as:  XANAX  Take 1 mg by mouth 3 (three) times daily as needed. For anxiety     diphenoxylate-atropine 2.5-0.025 MG per tablet  Commonly known as:  LOMOTIL  Take 2 tablets every 4 hours as long as you have diarrhea.     GABAPENTIN PO  Take 1 capsule by mouth 3 (three) times daily.     glucosamine-chondroitin 500-400 MG tablet  Take 3 tablets by mouth daily.     guaiFENesin 600 MG 12 hr tablet  Commonly known as:  MUCINEX  Take 2 tablets (1,200 mg total) by mouth 2 (two) times daily.     levofloxacin 750 MG tablet  Commonly known as:  LEVAQUIN  Take 1 tablet (750 mg total) by mouth daily.  Start taking on:  04/26/2014       Allergies  Allergen Reactions  . Bee Venom Swelling  . Hydrocodone     Rash   . Penicillins Other (See Comments)    Reaction in childhood, caused patient to pass out  . Sulfa Antibiotics     Rash,       The results of significant diagnostics from this hospitalization (including imaging, microbiology, ancillary and laboratory) are listed below for reference.    Significant Diagnostic Studies: Dg Chest 2 View  04/23/2014   CLINICAL DATA:  Shortness of breath and fatigue  EXAM: CHEST  2 VIEW  COMPARISON:  Mar 28, 2013  FINDINGS: There is underlying emphysematous change. There is interstitial prominence in the right lower lung zones suspicious for interstitial infiltrate. There is no airspace consolidation. The heart size is within normal limits. The pulmonary vascularity reflects underlying emphysematous change. No appreciable adenopathy.  No bone lesions.  IMPRESSION: Underlying emphysema. Right lower lobe interstitial infiltrate. No airspace consolidation.   Electronically Signed   By: Lowella Grip M.D.   On: 04/23/2014 16:59   US Venous Img Lower Bilateral  04/24/2014   CLINICAL DATA:  Bilateral leg pain.  EXAM: BILATERAL LOWER EXTREMITY VENOUS DOPPLER ULTRASOUND  TECHNIQUE: Gray-scale sonography with graded compression, as well as color Doppler and duplex ultrasound were performed to evaluate the lower extremity deep venous systems from the level of the common femoral vein and including the common femoral, femoral, profunda femoral, popliteal and calf veins including the posterior tibial, peroneal and gastrocnemius veins when visible. The superficial great saphenous vein was also interrogated. Spectral Doppler was utilized to  evaluate flow at rest and with distal augmentation maneuvers in the common femoral, femoral and popliteal veins.  COMPARISON:  No priors.  FINDINGS: RIGHT LOWER EXTREMITY  Common Femoral Vein: No evidence of thrombus. Normal compressibility, respiratory phasicity and response to augmentation.  Saphenofemoral Junction: No evidence of thrombus. Normal compressibility and flow on color Doppler imaging.  Profunda Femoral Vein: No evidence of thrombus. Normal compressibility and flow on color Doppler imaging.  Femoral Vein: No evidence of thrombus. Normal compressibility, respiratory phasicity and response to augmentation.  Popliteal Vein: No evidence of thrombus. Normal compressibility, respiratory phasicity and response to augmentation.  Calf Veins: No evidence of thrombus. Normal compressibility and flow on color Doppler imaging.  Superficial Great Saphenous Vein: No evidence of thrombus. Normal compressibility and flow on color Doppler imaging.  Venous Reflux:  None.  Other Findings:  None.  LEFT LOWER EXTREMITY  Common Femoral Vein: No evidence of thrombus. Normal compressibility, respiratory phasicity and response to  augmentation.  Saphenofemoral Junction: No evidence of thrombus. Normal compressibility and flow on color Doppler imaging.  Profunda Femoral Vein: No evidence of thrombus. Normal compressibility and flow on color Doppler imaging.  Femoral Vein: No evidence of thrombus. Normal compressibility, respiratory phasicity and response to augmentation.  Popliteal Vein: No evidence of thrombus. Normal compressibility, respiratory phasicity and response to augmentation.  Calf Veins: No evidence of thrombus. Normal compressibility and flow on color Doppler imaging.  Superficial Great Saphenous Vein: No evidence of thrombus. Normal compressibility and flow on color Doppler imaging.  Venous Reflux:  None.  Other Findings:  None.  IMPRESSION: No evidence of deep venous thrombosis in either of the lower extremities.   Electronically Signed   By: Vinnie Langton M.D.   On: 04/24/2014 13:31    Microbiology: Recent Results (from the past 240 hour(s))  CULTURE, BLOOD (ROUTINE X 2)     Status: None   Collection Time    04/23/14  5:42 PM      Result Value Ref Range Status   Specimen Description LEFT ANTECUBITAL   Final   Special Requests BOTTLES DRAWN AEROBIC AND ANAEROBIC 10CC   Final   Culture NO GROWTH 2 DAYS   Final   Report Status PENDING   Incomplete  CULTURE, BLOOD (ROUTINE X 2)     Status: None   Collection Time    04/23/14  5:42 PM      Result Value Ref Range Status   Specimen Description BLOOD RIGHT HAND   Final   Special Requests BOTTLES DRAWN AEROBIC ONLY 6CC   Final   Culture NO GROWTH 2 DAYS   Final   Report Status PENDING   Incomplete  MRSA PCR SCREENING     Status: None   Collection Time    04/23/14  8:10 PM      Result Value Ref Range Status   MRSA by PCR NEGATIVE  NEGATIVE Final   Comment:            The GeneXpert MRSA Assay (FDA     approved for NASAL specimens     only), is one component of a     comprehensive MRSA colonization     surveillance program. It is not     intended to  diagnose MRSA     infection nor to guide or     monitor treatment for     MRSA infections.     Labs: Basic Metabolic Panel:  Recent Labs Lab 04/23/14 1545 04/24/14 0532 04/25/14 6568  NA 137 138 144  K 4.0 3.4* 4.1  CL 97 98 105  CO2 30 27 29   GLUCOSE 125* 188* 108*  BUN 12 12 12   CREATININE 0.61 0.50 0.61  CALCIUM 8.6 8.7 8.7   Liver Function Tests:  Recent Labs Lab 04/23/14 1545  AST 31  ALT 13  ALKPHOS 95  BILITOT 0.6  PROT 5.7*  ALBUMIN 3.0*   No results found for this basename: LIPASE, AMYLASE,  in the last 168 hours No results found for this basename: AMMONIA,  in the last 168 hours CBC:  Recent Labs Lab 04/23/14 1545 04/24/14 0532 04/25/14 0515  WBC 20.6* 12.9* 18.3*  NEUTROABS 17.9*  --   --   HGB 12.7* 12.0* 12.0*  HCT 37.6* 36.0* 35.6*  MCV 88.9 89.1 88.6  PLT 276 276 308   Cardiac Enzymes: No results found for this basename: CKTOTAL, CKMB, CKMBINDEX, TROPONINI,  in the last 168 hours BNP: BNP (last 3 results) No results found for this basename: PROBNP,  in the last 8760 hours CBG: No results found for this basename: GLUCAP,  in the last 168 hours     Signed:  Kathie Dike  [facility] 04/25/2014, 2:27 PM

## 2014-04-25 NOTE — Progress Notes (Signed)
ANTIBIOTIC CONSULT NOTE - follow up  Pharmacy Consult for Levaquin / Vancomcyin Indication: rule out pneumonia  Allergies  Allergen Reactions  . Bee Venom Swelling  . Hydrocodone     Rash   . Penicillins Other (See Comments)    Reaction in childhood, caused patient to pass out  . Sulfa Antibiotics     Rash,    Patient Measurements: Height: 5\' 10"  (177.8 cm) Weight: 169 lb (76.658 kg) IBW/kg (Calculated) : 73  Vital Signs: Temp: 98 F (36.7 C) (06/08 0707) Temp src: Oral (06/08 0707) BP: 133/73 mmHg (06/08 0707) Pulse Rate: 89 (06/08 1039) Intake/Output from previous day: 06/07 0701 - 06/08 0700 In: 4131.5 [P.O.:720; I.V.:1030; IV Piggyback:2381.5] Out: 1610 [Urine:1750] Intake/Output from this shift:    Labs:  Recent Labs  04/23/14 1545 04/24/14 0532 04/25/14 0515  WBC 20.6* 12.9* 18.3*  HGB 12.7* 12.0* 12.0*  PLT 276 276 308  CREATININE 0.61 0.50 0.61   Estimated Creatinine Clearance: 101.4 ml/min (by C-G formula based on Cr of 0.61). No results found for this basename: VANCOTROUGH, Corlis Leak, VANCORANDOM, GENTTROUGH, GENTPEAK, GENTRANDOM, TOBRATROUGH, TOBRAPEAK, TOBRARND, AMIKACINPEAK, AMIKACINTROU, AMIKACIN,  in the last 72 hours   Microbiology: Recent Results (from the past 720 hour(s))  CULTURE, BLOOD (ROUTINE X 2)     Status: None   Collection Time    04/23/14  5:42 PM      Result Value Ref Range Status   Specimen Description LEFT ANTECUBITAL   Final   Special Requests BOTTLES DRAWN AEROBIC AND ANAEROBIC 10CC   Final   Culture NO GROWTH 1 DAY   Final   Report Status PENDING   Incomplete  CULTURE, BLOOD (ROUTINE X 2)     Status: None   Collection Time    04/23/14  5:42 PM      Result Value Ref Range Status   Specimen Description BLOOD RIGHT HAND   Final   Special Requests BOTTLES DRAWN AEROBIC ONLY [place]   Final   Culture NO GROWTH 1 DAY   Final   Report Status PENDING   Incomplete  MRSA PCR SCREENING     Status: None   Collection Time   04/23/14  8:10 PM      Result Value Ref Range Status   MRSA by PCR NEGATIVE  NEGATIVE Final   Comment:            The GeneXpert MRSA Assay (FDA     approved for NASAL specimens     only), is one component of a     comprehensive MRSA colonization     surveillance program. It is not     intended to diagnose MRSA     infection nor to guide or     monitor treatment for     MRSA infections.   Medical History: Past Medical History  Diagnosis Date  . COPD (chronic obstructive pulmonary disease)   . High cholesterol   . Chronic lower back pain    Medications:  Prescriptions prior to admission  Medication Sig Dispense Refill  . albuterol (PROVENTIL HFA;VENTOLIN HFA) 108 (90 BASE) MCG/ACT inhaler Inhale 2 puffs into the lungs every 6 (six) hours as needed for wheezing or shortness of breath.      . ALPRAZolam (XANAX) 1 MG tablet Take 1 mg by mouth 3 (three) times daily as needed. For anxiety      . Aspirin-Acetaminophen-Caffeine (GOODYS EXTRA STRENGTH PO) Take 1 Package by mouth as needed. For pain      .  glucosamine-chondroitin 500-400 MG tablet Take 3 tablets by mouth daily.        . diclofenac (VOLTAREN) 50 MG EC tablet Take 50 mg by mouth 2 (two) times daily.      . diphenoxylate-atropine (LOMOTIL) 2.5-0.025 MG per tablet Take 2 tablets every 4 hours as long as you have diarrhea.  30 tablet  0  . GABAPENTIN PO Take 1 capsule by mouth 3 (three) times daily.       Assessment: Okay for Protocol, patient received initial dose of Levaquin in ED.  58 yo male with COPD.  Afebrile.  Elevated WBC.    Levaquin 6/6 >> Vancomycin 6/6 >>  Goal of Therapy:  Vancomycin trough level 15-20 mcg/ml  Plan:  Levaquin 750mg  PO every 24 hours. Vancomycin 1gm IV every 8 hours. Measure antibiotic drug levels at steady state Follow up culture results  PHARMACIST - PHYSICIAN COMMUNICATION CONCERNING: Antibiotic IV to Oral Route Change Policy  RECOMMENDATION: This patient is receiving Levaquin by  the intravenous route.  Based on criteria approved by the Pharmacy and Therapeutics Committee, the antibiotic(s) is/are being converted to the equivalent oral dose form(s).  DESCRIPTION: These criteria include:  Patient being treated for a respiratory tract infection, urinary tract infection, cellulitis or clostridium difficile associated diarrhea if on metronidazole  The patient is not neutropenic and does not exhibit a GI malabsorption state  The patient is eating (either orally or via tube) and/or has been taking other orally administered medications for a least 24 hours  The patient is improving clinically and has a Tmax < 100.5  If you have questions about this conversion, please contact the Pharmacy Department  [x]   [phone] )  [facility] []   [phone] )  [facility]  []   [phone] )  [facility] []   [phone] )  [place] 04/25/2014,11:33 AM

## 2014-04-28 LAB — CULTURE, BLOOD (ROUTINE X 2)
CULTURE: NO GROWTH
CULTURE: NO GROWTH

## 2014-10-09 ENCOUNTER — Emergency Department (HOSPITAL_COMMUNITY)
Admission: EM | Admit: 2014-10-09 | Discharge: 2014-10-09 | Disposition: A | Discharge status: Home / Self Care | Payer: Medicare Other | Attending: Emergency Medicine | Admitting: Emergency Medicine

## 2014-10-09 ENCOUNTER — Encounter (HOSPITAL_COMMUNITY): Payer: Self-pay | Admitting: Emergency Medicine

## 2014-10-09 ENCOUNTER — Emergency Department (HOSPITAL_COMMUNITY): Payer: Medicare Other

## 2014-10-09 DIAGNOSIS — J441 Chronic obstructive pulmonary disease with (acute) exacerbation: Secondary | ICD-10-CM | POA: Insufficient documentation

## 2014-10-09 DIAGNOSIS — Z792 Long term (current) use of antibiotics: Secondary | ICD-10-CM | POA: Diagnosis not present

## 2014-10-09 DIAGNOSIS — R079 Chest pain, unspecified: Secondary | ICD-10-CM | POA: Diagnosis present

## 2014-10-09 DIAGNOSIS — Z79899 Other long term (current) drug therapy: Secondary | ICD-10-CM | POA: Diagnosis not present

## 2014-10-09 DIAGNOSIS — E780 Pure hypercholesterolemia: Secondary | ICD-10-CM | POA: Insufficient documentation

## 2014-10-09 DIAGNOSIS — Z8614 Personal history of Methicillin resistant Staphylococcus aureus infection: Secondary | ICD-10-CM | POA: Insufficient documentation

## 2014-10-09 DIAGNOSIS — R1011 Right upper quadrant pain: Secondary | ICD-10-CM | POA: Insufficient documentation

## 2014-10-09 DIAGNOSIS — Z880 Allergy status to penicillin: Secondary | ICD-10-CM | POA: Insufficient documentation

## 2014-10-09 DIAGNOSIS — R110 Nausea: Secondary | ICD-10-CM | POA: Diagnosis not present

## 2014-10-09 DIAGNOSIS — G8929 Other chronic pain: Secondary | ICD-10-CM | POA: Insufficient documentation

## 2014-10-09 DIAGNOSIS — Z720 Tobacco use: Secondary | ICD-10-CM | POA: Diagnosis not present

## 2014-10-09 HISTORY — DX: Methicillin resistant Staphylococcus aureus infection, unspecified site: A49.02

## 2014-10-09 LAB — CBC WITH DIFFERENTIAL/PLATELET
BASOS ABS: 0 10*3/uL (ref 0.0–0.1)
Basophils Relative: 0 % (ref 0–1)
EOS PCT: 0 % (ref 0–5)
Eosinophils Absolute: 0.1 10*3/uL (ref 0.0–0.7)
HCT: 44.3 % (ref 39.0–52.0)
Hemoglobin: 15.3 g/dL (ref 13.0–17.0)
LYMPHS ABS: 2.1 10*3/uL (ref 0.7–4.0)
LYMPHS PCT: 14 % (ref 12–46)
MCH: 32.3 pg (ref 26.0–34.0)
MCHC: 34.5 g/dL (ref 30.0–36.0)
MCV: 93.7 fL (ref 78.0–100.0)
Monocytes Absolute: 0.9 10*3/uL (ref 0.1–1.0)
Monocytes Relative: 6 % (ref 3–12)
NEUTROS ABS: 11.9 10*3/uL — AB (ref 1.7–7.7)
NEUTROS PCT: 80 % — AB (ref 43–77)
PLATELETS: 285 10*3/uL (ref 150–400)
RBC: 4.73 MIL/uL (ref 4.22–5.81)
RDW: 14.3 % (ref 11.5–15.5)
WBC: 14.9 10*3/uL — AB (ref 4.0–10.5)

## 2014-10-09 LAB — COMPREHENSIVE METABOLIC PANEL
ALT: 16 U/L (ref 0–53)
ANION GAP: 13 (ref 5–15)
AST: 17 U/L (ref 0–37)
Albumin: 4.3 g/dL (ref 3.5–5.2)
Alkaline Phosphatase: 91 U/L (ref 39–117)
BILIRUBIN TOTAL: 0.5 mg/dL (ref 0.3–1.2)
BUN: 19 mg/dL (ref 6–23)
CALCIUM: 9.6 mg/dL (ref 8.4–10.5)
CHLORIDE: 100 meq/L (ref 96–112)
CO2: 28 meq/L (ref 19–32)
CREATININE: 0.85 mg/dL (ref 0.50–1.35)
GLUCOSE: 110 mg/dL — AB (ref 70–99)
Potassium: 3.7 mEq/L (ref 3.7–5.3)
Sodium: 141 mEq/L (ref 137–147)
Total Protein: 7 g/dL (ref 6.0–8.3)

## 2014-10-09 LAB — LIPASE, BLOOD: LIPASE: 24 U/L (ref 11–59)

## 2014-10-09 LAB — TROPONIN I: Troponin I: <0.3 ng/mL (ref <0.30)

## 2014-10-09 MED ORDER — MORPHINE SULFATE 4 MG/ML IJ SOLN
4.0000 mg | Freq: Once | INTRAMUSCULAR | Status: AC
  Administered 2014-10-09: 4 mg via INTRAVENOUS
  Filled 2014-10-09: qty 1

## 2014-10-09 MED ORDER — IOHEXOL 350 MG/ML SOLN
100.0000 mL | Freq: Once | INTRAVENOUS | Status: AC | PRN
  Administered 2014-10-09: 100 mL via INTRAVENOUS

## 2014-10-09 MED ORDER — TRAMADOL HCL 50 MG PO TABS: 50.0000 mg | ORAL_TABLET | Freq: Four times a day (QID) | ORAL | Status: DC | PRN

## 2014-10-09 NOTE — ED Provider Notes (Signed)
CSN: [id number]     Arrival date & time 10/09/14  1330 History   First MD Initiated Contact with Patient 10/09/14 1352    This chart was scribed for Garrett Speak, MD by Terressa Koyanagi, ED Scribe. This patient was seen in room APA09/APA09 and the patient's care was started at 1:58 PM.  Chief Complaint  Patient presents with  . Chest Pain   The history is provided by the patient. No language interpreter was used.   PCP: Glennon Mac., Cristopher Estimable, MD HPI Comments: Garrett Klein is a 58 y.o. male, with medical Hx noted below and significant for COPD, high cholesterol, and daily tobacco use (0.5 ppd), who presents to the Emergency Department complaining of waxing and waning, atraumatic chest pain under his right ribs, intermittently radiating across his chest onset 3 weeks ago. Pt also complains of associated productive cough and nausea. Pt reports he was seen for the same at [facility] and the [facility]-- whereby, pt had imaging and was provided antibiotics and prednisone. Pt notes he is still taking the prednisone and despite the meds his Sx have not improved. Pt reports SOB at baseline due to COPD and denies any worsening SOB.    Past Medical History  Diagnosis Date  . COPD (chronic obstructive pulmonary disease)   . High cholesterol   . Chronic lower back pain   . MRSA infection    Past Surgical History  Procedure Laterality Date  . Back surgery    . Joint replacement      left hip surgery - January 26, 2014  . Rotator cuff repair      November 2014  . Elbow bursa surgery     Family History  Problem Relation Age of Onset  . Diabetes Other   . Heart attack    . Cancer Mother    History  Substance Use Topics  . Smoking status: Current Every Day Smoker -- 0.50 packs/day for 40 years    Types: Cigarettes  . Smokeless tobacco: Never Used  . Alcohol Use: No    Review of Systems  A complete 10 system review of systems was obtained and all systems are negative except as noted in the HPI  and PMH.    Allergies  Bee venom; Hydrocodone; Penicillins; and Sulfa antibiotics  Home Medications   Prior to Admission medications   Medication Sig Start Date End Date Taking? Authorizing Provider  albuterol (PROVENTIL HFA;VENTOLIN HFA) 108 (90 BASE) MCG/ACT inhaler Inhale 2 puffs into the lungs every 6 (six) hours as needed for wheezing or shortness of breath.    Historical Provider, MD  ALPRAZolam [name]) 1 MG tablet Take 1 mg by mouth 3 (three) times daily as needed. For anxiety    Historical Provider, MD  diphenoxylate-atropine (LOMOTIL) 2.5-0.025 MG per tablet Take 2 tablets every 4 hours as long as you have diarrhea. 03/28/13   Mylinda Latina, MD  GABAPENTIN PO Take 1 capsule by mouth 3 (three) times daily.    Historical Provider, MD  glucosamine-chondroitin 500-400 MG tablet Take 3 tablets by mouth daily.      Historical Provider, MD  guaiFENesin (MUCINEX) 600 MG 12 hr tablet Take 2 tablets (1,200 mg total) by mouth 2 (two) times daily. 04/25/14   Kathie Dike, MD  levofloxacin (LEVAQUIN) 750 MG tablet Take 1 tablet (750 mg total) by mouth daily. 04/26/14   Kathie Dike, MD   Triage Vitals: BP 149/80 mmHg  Pulse 78  Temp(Src) 97.7 F (36.5 C) (Oral)  Resp 18  Ht 5\' 5"  (1.651 m)  Wt 180 lb (81.647 kg)  BMI 29.95 kg/m2  SpO2 100% Physical Exam  Constitutional: He is oriented to person, place, and time. He appears well-developed and well-nourished. No distress.  HENT:  Head: Normocephalic and atraumatic.  Eyes: Conjunctivae and EOM are normal.  Neck: Neck supple. No tracheal deviation present.  Cardiovascular: Normal rate.   Pulmonary/Chest: Effort normal. No respiratory distress.  Abdominal: Soft. Bowel sounds are normal. He exhibits no distension and no mass. There is tenderness. There is no rebound and no guarding.  Tenderness to palpation in the RUQ, right lower rib cage.   Musculoskeletal: Normal range of motion.  Neurological: He is alert and oriented to person, place,  and time.  Skin: Skin is warm and dry.  Psychiatric: He has a normal mood and affect. His behavior is normal.  Nursing note and vitals reviewed.   ED Course  Procedures (including critical care time) DIAGNOSTIC STUDIES: Oxygen Saturation is 100% on RA, nl by my interpretation.    COORDINATION OF CARE: 2:05 PM-Discussed treatment plan which includes CT scan and meds with pt at bedside and pt agreed to plan.   Labs Review Labs Reviewed - No data to display  Imaging Review No results found.   EKG Interpretation   Date/Time:  Sunday October 09 2014 13:46:48 EST Ventricular Rate:  73 PR Interval:  146 QRS Duration: 140 QT Interval:  402 QTC Calculation: 442 R Axis:   67 Text Interpretation:  Normal sinus rhythm Right bundle branch block  Abnormal ECG Confirmed by DELOS  MD, Larose Batres ([id number]) on 10/09/2014 4:13:55  PM      MDM   Final diagnoses:  None    Patient is a 58 year old male with history of COPD. He presents today with complaints of constant pain in the right side of his chest which has been worsening over the past 3 weeks. This has occurred in the absence of any injury or trauma. He has been evaluated by 2 physicians, one at the [place] and the other at [facility]. Neither his found an explanation for his pain. He denies to me he is having any shortness of breath.  Clinically, his pain appears musculoskeletal in nature. He is concerned that he may have lung cancer. Laboratory studies are essentially unremarkable and the patient ultimately went for a CT scan of his chest which was negative for blood clot or other acute abnormality. I feel as though he is appropriate for discharge. He is to follow-up with his primary Dr. if not improving in the next week.  I personally performed the services described in this documentation, which was scribed in my presence. The recorded information has been reviewed and is accurate.      Garrett Speak, MD 10/09/14 [phone]

## 2014-10-09 NOTE — ED Notes (Addendum)
Patient c/o pain under right ribs. Per patient has had constant pain that moves in chest x2-3 weeks in which he was seen at [facility] and [place] hospital, had x-rays, given antibiotics, and prednisone. Per patient was told bronchitis. Patient reports hx of COPD. Patient reports shortness of breath as "no more than normal." Patient does report nausea.

## 2014-10-09 NOTE — Discharge Instructions (Signed)
Ibuprofen 600 mg 3 times daily for the next 5 days. Tramadol as prescribed as needed for pain not relieved with ibuprofen.  Follow-up with your primary Dr. if not improving in the next week, and return to the ER if your symptoms substantially worsen or change.   Chest Pain (Nonspecific) It is often hard to give a specific diagnosis for the cause of chest pain. There is always a chance that your pain could be related to something serious, such as a heart attack or a blood clot in the lungs. You need to follow up with your health care provider for further evaluation. CAUSES   Heartburn.  Pneumonia or bronchitis.  Anxiety or stress.  Inflammation around your heart (pericarditis) or lung (pleuritis or pleurisy).  A blood clot in the lung.  A collapsed lung (pneumothorax). It can develop suddenly on its own (spontaneous pneumothorax) or from trauma to the chest.  Shingles infection (herpes zoster virus). The chest wall is composed of bones, muscles, and cartilage. Any of these can be the source of the pain.  The bones can be bruised by injury.  The muscles or cartilage can be strained by coughing or overwork.  The cartilage can be affected by inflammation and become sore (costochondritis). DIAGNOSIS  Lab tests or other studies may be needed to find the cause of your pain. Your health care provider may have you take a test called an ambulatory electrocardiogram (ECG). An ECG records your heartbeat patterns over a 24-hour period. You may also have other tests, such as:  Transthoracic echocardiogram (TTE). During echocardiography, sound waves are used to evaluate how blood flows through your heart.  Transesophageal echocardiogram (TEE).  Cardiac monitoring. This allows your health care provider to monitor your heart rate and rhythm in real time.  Holter monitor. This is a portable device that records your heartbeat and can help diagnose heart arrhythmias. It allows your health care  provider to track your heart activity for several days, if needed.  Stress tests by exercise or by giving medicine that makes the heart beat faster. TREATMENT   Treatment depends on what may be causing your chest pain. Treatment may include:  Acid blockers for heartburn.  Anti-inflammatory medicine.  Pain medicine for inflammatory conditions.  Antibiotics if an infection is present.  You may be advised to change lifestyle habits. This includes stopping smoking and avoiding alcohol, caffeine, and chocolate.  You may be advised to keep your head raised (elevated) when sleeping. This reduces the chance of acid going backward from your stomach into your esophagus. Most of the time, nonspecific chest pain will improve within 2-3 days with rest and mild pain medicine.  HOME CARE INSTRUCTIONS   If antibiotics were prescribed, take them as directed. Finish them even if you start to feel better.  For the next few days, avoid physical activities that bring on chest pain. Continue physical activities as directed.  Do not use any tobacco products, including cigarettes, chewing tobacco, or electronic cigarettes.  Avoid drinking alcohol.  Only take medicine as directed by your health care provider.  Follow your health care provider's suggestions for further testing if your chest pain does not go away.  Keep any follow-up appointments you made. If you do not go to an appointment, you could develop lasting (chronic) problems with pain. If there is any problem keeping an appointment, call to reschedule. SEEK MEDICAL CARE IF:   Your chest pain does not go away, even after treatment.  You have a rash  with blisters on your chest.  You have a fever. SEEK IMMEDIATE MEDICAL CARE IF:   You have increased chest pain or pain that spreads to your arm, neck, jaw, back, or abdomen.  You have shortness of breath.  You have an increasing cough, or you cough up blood.  You have severe back or  abdominal pain.  You feel nauseous or vomit.  You have severe weakness.  You faint.  You have chills. This is an emergency. Do not wait to see if the pain will go away. Get medical help at once. Call your local emergency services (911 in [place] Do not drive yourself to the hospital. MAKE SURE YOU:   Understand these instructions.  Will watch your condition.  Will get help right away if you are not doing well or get worse. Document Released: 08/14/2005 Document Revised: 11/09/2013 Document Reviewed: 06/09/2008 [facility] Patient Information 2015 [place], [place]. This information is not intended to replace advice given to you by your health care provider. Make sure you discuss any questions you have with your health care provider.

## 2014-10-18 ENCOUNTER — Emergency Department (HOSPITAL_COMMUNITY): Payer: Medicare Other

## 2014-10-18 ENCOUNTER — Encounter (HOSPITAL_COMMUNITY): Payer: Self-pay | Admitting: Emergency Medicine

## 2014-10-18 ENCOUNTER — Emergency Department (HOSPITAL_COMMUNITY)
Admission: EM | Admit: 2014-10-18 | Discharge: 2014-10-18 | Disposition: A | Discharge status: Home / Self Care | Payer: Medicare Other | Attending: Emergency Medicine | Admitting: Emergency Medicine

## 2014-10-18 DIAGNOSIS — G8929 Other chronic pain: Secondary | ICD-10-CM | POA: Insufficient documentation

## 2014-10-18 DIAGNOSIS — Z8614 Personal history of Methicillin resistant Staphylococcus aureus infection: Secondary | ICD-10-CM | POA: Diagnosis not present

## 2014-10-18 DIAGNOSIS — E780 Pure hypercholesterolemia: Secondary | ICD-10-CM | POA: Diagnosis not present

## 2014-10-18 DIAGNOSIS — R1011 Right upper quadrant pain: Secondary | ICD-10-CM | POA: Insufficient documentation

## 2014-10-18 DIAGNOSIS — Z720 Tobacco use: Secondary | ICD-10-CM | POA: Insufficient documentation

## 2014-10-18 DIAGNOSIS — J449 Chronic obstructive pulmonary disease, unspecified: Secondary | ICD-10-CM | POA: Insufficient documentation

## 2014-10-18 DIAGNOSIS — Z79899 Other long term (current) drug therapy: Secondary | ICD-10-CM | POA: Diagnosis not present

## 2014-10-18 DIAGNOSIS — Z791 Long term (current) use of non-steroidal anti-inflammatories (NSAID): Secondary | ICD-10-CM | POA: Insufficient documentation

## 2014-10-18 DIAGNOSIS — R110 Nausea: Secondary | ICD-10-CM | POA: Insufficient documentation

## 2014-10-18 DIAGNOSIS — R1031 Right lower quadrant pain: Secondary | ICD-10-CM | POA: Diagnosis present

## 2014-10-18 LAB — COMPREHENSIVE METABOLIC PANEL
ALBUMIN: 3.7 g/dL (ref 3.5–5.2)
ALK PHOS: 84 U/L (ref 39–117)
ALT: 16 U/L (ref 0–53)
AST: 18 U/L (ref 0–37)
Anion gap: 12 (ref 5–15)
BUN: 11 mg/dL (ref 6–23)
CHLORIDE: 104 meq/L (ref 96–112)
CO2: 25 mEq/L (ref 19–32)
Calcium: 8.9 mg/dL (ref 8.4–10.5)
Creatinine, Ser: 0.95 mg/dL (ref 0.50–1.35)
GFR calc Af Amer: >90 mL/min (ref >90)
GFR calc non Af Amer: 89 mL/min — ABNORMAL LOW (ref >90)
Glucose, Bld: 102 mg/dL — ABNORMAL HIGH (ref 70–99)
POTASSIUM: 4 meq/L (ref 3.7–5.3)
SODIUM: 141 meq/L (ref 137–147)
TOTAL PROTEIN: 6.7 g/dL (ref 6.0–8.3)

## 2014-10-18 LAB — CBC WITH DIFFERENTIAL/PLATELET
BASOS PCT: 1 % (ref 0–1)
Basophils Absolute: 0.1 10*3/uL (ref 0.0–0.1)
EOS ABS: 0.3 10*3/uL (ref 0.0–0.7)
Eosinophils Relative: 3 % (ref 0–5)
HCT: 43.4 % (ref 39.0–52.0)
Hemoglobin: 15 g/dL (ref 13.0–17.0)
Lymphocytes Relative: 30 % (ref 12–46)
Lymphs Abs: 3 10*3/uL (ref 0.7–4.0)
MCH: 32.9 pg (ref 26.0–34.0)
MCHC: 34.6 g/dL (ref 30.0–36.0)
MCV: 95.2 fL (ref 78.0–100.0)
Monocytes Absolute: 0.7 10*3/uL (ref 0.1–1.0)
Monocytes Relative: 8 % (ref 3–12)
NEUTROS ABS: 5.7 10*3/uL (ref 1.7–7.7)
Neutrophils Relative %: 58 % (ref 43–77)
PLATELETS: 364 10*3/uL (ref 150–400)
RBC: 4.56 MIL/uL (ref 4.22–5.81)
RDW: 14.9 % (ref 11.5–15.5)
WBC: 9.7 10*3/uL (ref 4.0–10.5)

## 2014-10-18 LAB — LIPASE, BLOOD: Lipase: 13 U/L (ref 11–59)

## 2014-10-18 MED ORDER — KETOROLAC TROMETHAMINE 30 MG/ML IJ SOLN
30.0000 mg | Freq: Once | INTRAMUSCULAR | Status: AC
  Administered 2014-10-18: 30 mg via INTRAVENOUS
  Filled 2014-10-18: qty 1

## 2014-10-18 MED ORDER — SODIUM CHLORIDE 0.9 % IV BOLUS (SEPSIS)
1000.0000 mL | Freq: Once | INTRAVENOUS | Status: AC
  Administered 2014-10-18: 1000 mL via INTRAVENOUS

## 2014-10-18 MED ORDER — IOHEXOL 300 MG/ML  SOLN
100.0000 mL | Freq: Once | INTRAMUSCULAR | Status: AC | PRN
  Administered 2014-10-18: 100 mL via INTRAVENOUS

## 2014-10-18 MED ORDER — ONDANSETRON HCL 4 MG/2ML IJ SOLN
4.0000 mg | Freq: Once | INTRAMUSCULAR | Status: AC
  Administered 2014-10-18: 4 mg via INTRAVENOUS
  Filled 2014-10-18: qty 2

## 2014-10-18 MED ORDER — OXYCODONE-ACETAMINOPHEN 5-325 MG PO TABS: 1.0000 | ORAL_TABLET | Freq: Four times a day (QID) | ORAL | Status: DC | PRN

## 2014-10-18 MED ORDER — IOHEXOL 300 MG/ML  SOLN
25.0000 mL | Freq: Once | INTRAMUSCULAR | Status: AC | PRN
  Administered 2014-10-18: 25 mL via ORAL

## 2014-10-18 NOTE — ED Notes (Signed)
Pt verbalized understanding of no driving and to use caution within 4 hours of taking pain meds due to meds cause drowsiness 

## 2014-10-18 NOTE — ED Notes (Signed)
Pt reports seen for same x1 month ago. Pt reports continued pain and reports pain is now in RLQ. Pt reports loss of appetite, nausea, intermittent vomiting.

## 2014-10-18 NOTE — ED Provider Notes (Signed)
CSN: [id number]     Arrival date & time 10/18/14  1538 History  This chart was scribed for Veryl Speak, MD by Delphia Grates, ED Scribe. This patient was seen in room APA10/AP[facility]10 and the patient's care was started at 4:08 PM.     Chief Complaint  Patient presents with  . Abdominal Pain    The history is provided by the patient and the spouse. No language interpreter was used.     HPI Comments: Garrett Klein is a 58 y.o. male who presents to the Emergency Department complaining of constant, severe right lower abdominal pain that has been ongoing for the past month. Patient states he was seen here on October 09, 2014 for the same, however, he states the pain has now migrated to the RLQ of the abdomen. There is associated loss of appetite, nausea, and intermittent vomiting. Patient states he did not eat yesterday, and reports the pain is resolved only while sleeping. He states that immediately after eating this morning, the pain returned. He denies fever, constipation. Patient also denies any history of abdominal surgeries. He reports 2 weeks ago, he was seen at the [facility] for acute bronchitis where he was treated with steroids and doxycycline.    Past Medical History  Diagnosis Date  . COPD (chronic obstructive pulmonary disease)   . High cholesterol   . Chronic lower back pain   . MRSA infection    Past Surgical History  Procedure Laterality Date  . Back surgery    . Joint replacement      left hip surgery - January 26, 2014  . Rotator cuff repair      November 2014  . Elbow bursa surgery     Family History  Problem Relation Age of Onset  . Diabetes Other   . Heart attack    . Cancer Mother    History  Substance Use Topics  . Smoking status: Current Every Day Smoker -- 0.50 packs/day for 40 years    Types: Cigarettes  . Smokeless tobacco: Never Used  . Alcohol Use: No    Review of Systems  A complete 10 system review of systems was obtained and all systems are negative  except as noted in the HPI and PMH.    Allergies  Bee venom; Hydrocodone; Penicillins; Tramadol; and Sulfa antibiotics  Home Medications   Prior to Admission medications   Medication Sig Start Date End Date Taking? Authorizing Provider  albuterol (PROVENTIL HFA;VENTOLIN HFA) 108 (90 BASE) MCG/ACT inhaler Inhale 2 puffs into the lungs every 6 (six) hours as needed for wheezing or shortness of breath.    Historical Provider, MD  ALPRAZolam [name]) 1 MG tablet Take 1 mg by mouth 2 (two) times daily. For anxiety    Historical Provider, MD  atorvastatin (LIPITOR) 80 MG tablet Take 80 mg by mouth every evening.    Historical Provider, MD  cholecalciferol (VITAMIN D) 1000 UNITS tablet Take 1,000 Units by mouth daily.    Historical Provider, MD  diphenoxylate-atropine (LOMOTIL) 2.5-0.025 MG per tablet Take 2 tablets every 4 hours as long as you have diarrhea. Patient not taking: Reported on 10/09/2014 03/28/13   Mylinda Latina, MD  doxycycline (VIBRA-TABS) 100 MG tablet Take 100 mg by mouth 2 (two) times daily. 10 day course starting on 10/01/2014    Historical Provider, MD  guaiFENesin (MUCINEX) 600 MG 12 hr tablet Take 2 tablets (1,200 mg total) by mouth 2 (two) times daily. Patient not taking: Reported on 10/09/2014 04/25/14  Kathie Dike, MD  levofloxacin (LEVAQUIN) 750 MG tablet Take 1 tablet (750 mg total) by mouth daily. Patient not taking: Reported on 10/09/2014 04/26/14   Kathie Dike, MD  meloxicam (MOBIC) 15 MG tablet Take 15 mg by mouth daily.    Historical Provider, MD  traMADol (ULTRAM) 50 MG tablet Take 1 tablet (50 mg total) by mouth every 6 (six) hours as needed. 10/09/14   Veryl Speak, MD   Triage Vitals: BP 130/85 mmHg  Pulse 83  Resp 18  Ht 5\' 9"  (1.753 m)  Wt 180 lb (81.647 kg)  BMI 26.57 kg/m2  SpO2 99%  Physical Exam  Constitutional: He is oriented to person, place, and time. He appears well-developed and well-nourished. No distress.  HENT:  Head: Normocephalic and  atraumatic.  Eyes: Conjunctivae and EOM are normal.  Neck: Neck supple. No tracheal deviation present.  Cardiovascular: Normal rate, regular rhythm and normal heart sounds.   Pulmonary/Chest: Effort normal and breath sounds normal. No respiratory distress.  Abdominal: Soft. Bowel sounds are normal. He exhibits no distension. There is tenderness. There is no rebound and no guarding.  TTP in the RUQ and right mid abdomen.  Musculoskeletal: Normal range of motion.  Neurological: He is alert and oriented to person, place, and time.  Skin: Skin is warm and dry.  Psychiatric: He has a normal mood and affect. His behavior is normal.  Nursing note and vitals reviewed.   ED Course  Procedures (including critical care time)  DIAGNOSTIC STUDIES: Oxygen Saturation is 99% on room air, normal by my interpretation.    COORDINATION OF CARE: At 1613 Discussed treatment plan with patient which includes CT scan and IV fluids. Patient agrees.   Labs Review Labs Reviewed  COMPREHENSIVE METABOLIC PANEL - Abnormal; Notable for the following:    Glucose, Bld 102 (*)    Total Bilirubin <0.2 (*)    GFR calc non Af Amer 89 (*)    All other components within normal limits  CBC WITH DIFFERENTIAL  LIPASE, BLOOD    Imaging Review Ct Abdomen Pelvis W Contrast  10/18/2014   CLINICAL DATA:  Right upper and right lower quadrant pain for 1 month. Nausea and some vomiting.  EXAM: CT ABDOMEN AND PELVIS WITH CONTRAST  TECHNIQUE: Multidetector CT imaging of the abdomen and pelvis was performed using the standard protocol following bolus administration of intravenous contrast.  CONTRAST:  100 mL Omnipaque 300  COMPARISON:  03/28/2013  FINDINGS: Minimal dependent atelectasis is present in the lung bases. There may be a small hiatal hernia.  The liver, gallbladder, spleen, adrenal glands, and pancreas have an unremarkable enhanced appearance. 1.1 cm right lower pole renal cyst is again seen. Left extrarenal pelvis is again  seen. Small bilateral renal parapelvic cysts are noted.  There is suggestion of asymmetric wall thickening in the region the gastric antrum, although evaluation is partially limited by underdistention. This is more prominent than on the prior CT. Oral contrast is present in nondilated loops of proximal small bowel without evidence of obstruction. Scattered colonic diverticulosis is noted without evidence of diverticulitis. Appendix is identified in the right lower quadrant and is unremarkable.  There is advanced aortoiliac atherosclerotic calcification. Mild ectasia of the infrarenal abdominal aorta does not appear significantly changed, measuring up to 2.8 cm in diameter. No free fluid or enlarged lymph nodes are identified. Bladder is unremarkable. Prior left total hip arthroplasty is noted with mild surrounding heterotopic ossification. Advanced facet arthrosis is noted in the lower lumbar spine.  IMPRESSION: 1. Suggestion of wall thickening in the gastric antrum. Correlation with endoscopy may be helpful for further evaluation. 2. No other cause of abdominal pain identified in the abdomen or pelvis.   Electronically Signed   By: Logan Bores   On: 10/18/2014 17:38     EKG Interpretation None      MDM   Final diagnoses:  None    Patient presents with complaints of right upper quadrant and right mid abdominal pain that has been going on for approximately 2 weeks. He feels nauseated and his pain is exacerbated by eating. His workup thus far has been unremarkable. His laboratory studies reveal no elevation of his LFTs and normal lipase. He has no leukocytosis and CAT scan does not reveal any emergent process.  I feel as though he is appropriate for discharge this evening. His pain will be treated with Percocet and arrangements will be made for him to have a HIDA scan performed as an outpatient. I will also give the follow-up information for Dr. Oneida Alar who was on-call for gastroenterology to discuss  his situation.  I personally performed the services described in this documentation, which was scribed in my presence. The recorded information has been reviewed and is accurate.      Veryl Speak, MD 10/18/14 [phone]

## 2014-10-18 NOTE — Discharge Instructions (Signed)
Percocet as prescribed as needed for pain.  HIDA scan to be performed in radiology as scheduled.  Follow-up with Dr. Oneida Alar in the gastroenterology clinic. The contact information has been provided on this discharge summary. You should call tomorrow to arrange this appointment.   Abdominal Pain Many things can cause abdominal pain. Usually, abdominal pain is not caused by a disease and will improve without treatment. It can often be observed and treated at home. Your health care provider will do a physical exam and possibly order blood tests and X-rays to help determine the seriousness of your pain. However, in many cases, more time must pass before a clear cause of the pain can be found. Before that point, your health care provider may not know if you need more testing or further treatment. HOME CARE INSTRUCTIONS  Monitor your abdominal pain for any changes. The following actions may help to alleviate any discomfort you are experiencing:  Only take over-the-counter or prescription medicines as directed by your health care provider.  Do not take laxatives unless directed to do so by your health care provider.  Try a clear liquid diet (broth, tea, or water) as directed by your health care provider. Slowly move to a bland diet as tolerated. SEEK MEDICAL CARE IF:  You have unexplained abdominal pain.  You have abdominal pain associated with nausea or diarrhea.  You have pain when you urinate or have a bowel movement.  You experience abdominal pain that wakes you in the night.  You have abdominal pain that is worsened or improved by eating food.  You have abdominal pain that is worsened with eating fatty foods.  You have a fever. SEEK IMMEDIATE MEDICAL CARE IF:   Your pain does not go away within 2 hours.  You keep throwing up (vomiting).  Your pain is felt only in portions of the abdomen, such as the right side or the left lower portion of the abdomen.  You pass bloody or black  tarry stools. MAKE SURE YOU:  Understand these instructions.   Will watch your condition.   Will get help right away if you are not doing well or get worse.  Document Released: 08/14/2005 Document Revised: 11/09/2013 Document Reviewed: 07/14/2013 [facility] Patient Information 2015 [place], [place]. This information is not intended to replace advice given to you by your health care provider. Make sure you discuss any questions you have with your health care provider.

## 2014-10-19 ENCOUNTER — Other Ambulatory Visit (HOSPITAL_COMMUNITY): Payer: Self-pay | Admitting: Emergency Medicine

## 2014-10-19 DIAGNOSIS — R1011 Right upper quadrant pain: Secondary | ICD-10-CM

## 2014-10-20 ENCOUNTER — Encounter (HOSPITAL_COMMUNITY): Payer: Self-pay

## 2014-10-20 ENCOUNTER — Encounter (HOSPITAL_COMMUNITY)
Admission: RE | Admit: 2014-10-20 | Discharge: 2014-10-20 | Disposition: A | Discharge status: Home / Self Care | Payer: Medicare Other | Source: Ambulatory Visit | Attending: Emergency Medicine | Admitting: Emergency Medicine

## 2014-10-20 ENCOUNTER — Other Ambulatory Visit (HOSPITAL_COMMUNITY): Payer: Medicare Other

## 2014-10-20 DIAGNOSIS — R1011 Right upper quadrant pain: Secondary | ICD-10-CM | POA: Diagnosis not present

## 2014-10-20 MED ORDER — TECHNETIUM TC 99M MEBROFENIN IV KIT
5.0000 | PACK | Freq: Once | INTRAVENOUS | Status: AC | PRN
Start: 2014-10-20 — End: 2014-10-20
  Administered 2014-10-20: 5 via INTRAVENOUS

## 2014-10-20 MED ORDER — SINCALIDE 5 MCG IJ SOLR
INTRAMUSCULAR | Status: AC
  Administered 2014-10-20: 1.64 ug
  Filled 2014-10-20: qty 5

## 2014-10-20 MED ORDER — STERILE WATER FOR INJECTION IJ SOLN
INTRAMUSCULAR | Status: AC
  Administered 2014-10-20: 5 mL via INTRAVENOUS
  Filled 2014-10-20: qty 10

## 2014-10-20 MED ORDER — SODIUM CHLORIDE 0.9 % IJ SOLN
INTRAMUSCULAR | Status: AC
  Filled 2014-10-20: qty 12

## 2014-10-26 ENCOUNTER — Encounter: Payer: Self-pay | Admitting: Gastroenterology

## 2014-10-26 ENCOUNTER — Ambulatory Visit (INDEPENDENT_AMBULATORY_CARE_PROVIDER_SITE_OTHER): Payer: Medicare Other | Admitting: Gastroenterology

## 2014-10-26 ENCOUNTER — Other Ambulatory Visit: Payer: Self-pay

## 2014-10-26 VITALS — BP 147/89 | HR 72 | Temp 98.0°F | Ht 69.0 in | Wt 177.6 lb

## 2014-10-26 DIAGNOSIS — R933 Abnormal findings on diagnostic imaging of other parts of digestive tract: Secondary | ICD-10-CM

## 2014-10-26 DIAGNOSIS — R1011 Right upper quadrant pain: Secondary | ICD-10-CM

## 2014-10-26 DIAGNOSIS — R1013 Epigastric pain: Secondary | ICD-10-CM | POA: Insufficient documentation

## 2014-10-26 MED ORDER — PANTOPRAZOLE SODIUM 40 MG PO TBEC: 40.0000 mg | DELAYED_RELEASE_TABLET | Freq: Two times a day (BID) | ORAL | Status: DC

## 2014-10-26 NOTE — Patient Instructions (Addendum)
1. Upper endoscopy with Dr. Oneida Alar as scheduled.  2. Start pantoprazole 40mg  twice daily before breakfast and evening meal. RX sent to [place].  3. If you develop fever, worsening abdominal pain, intractable vomiting, black stools you should go to the ER. 4. No further Aspirin powders!

## 2014-10-26 NOTE — Assessment & Plan Note (Signed)
58 year old male with 4 week history of acute onset right upper quadrant/epigastric tenderness associated with vomiting, weight loss in the setting of chronic NSAIDs and aspirin powder abuse. CT shows asymmetric antral wall thickening. Nothing else on CT to explain his pain. LFTs within normal. Discussed with patient, consider EGD ASAP. Plan for Friday. Augment conscious sedation with Phenergan 25 mg IV 30 minutes before the procedure given prior history of pain medication use.  I have discussed the risks, alternatives, benefits with regards to but not limited to the risk of reaction to medication, bleeding, infection, perforation and the patient is agreeable to proceed. Written consent to be obtained.  If egd negative, would recommend abd u/s to rule out gallstones as 30% of stones are not seen via CT. Warning signs discussed with patient and provided under patient instructions. Start pantoprazole 40mg  BID. Avoid all aspirin products for now. Mobic continued due to significant back pain.

## 2014-10-26 NOTE — Progress Notes (Signed)
Primary Care Physician:  Raiford Simmonds., PA-C  Primary Gastroenterologist:  Barney Drain, MD   Chief Complaint  Patient presents with  . Abdominal Pain    HPI:  Garrett Klein is a 58 y.o. male here for urgent OV, self-referral. Complains of abdominal pain for past 4 weeks or so. Symptoms are in RUQ with radiation into epigastric and associated with regurgitation and vomiting intermittently. Initially symptoms worse with meals but now present regardless of oral intake. Can't sleep. No hematemesis. Stools were firm until this morning. BM regular. No brbpr or melena. On Mobic for 12 months. Was on Goody's powders 5-6 daily until 2 weeks ago. No PPI. No heartburn. Weight down 5 pounds. Remote PUD, ?EGD then. Colonoscopy within last couple of years, h/o polyps ([place]). No FH CRC.   Work up thus far includes CTA with no evidence of acute abnormality. CT A/P with contrast showed asymmetric wall thickening in region of the gastric antrum. HIDA negative. No abd u/s to date.  His WBC were initially elevated on 10/09/14 at 14,900. Other labs unremarkable including lipase and LFTs.  Patient has chronic back pain, previously on pain medication for <1 year. Has been off and using acupuncture for pain management.       Current Outpatient Prescriptions  Medication Sig Dispense Refill  . albuterol (PROVENTIL HFA;VENTOLIN HFA) 108 (90 BASE) MCG/ACT inhaler Inhale 2 puffs into the lungs every 6 (six) hours as needed for wheezing or shortness of breath.    Marland Kitchen albuterol (PROVENTIL) (2.5 MG/3ML) 0.083% nebulizer solution Take 2.5 mg by nebulization every 6 (six) hours as needed for wheezing or shortness of breath.    . ALPRAZolam (XANAX) 1 MG tablet Take 1 mg by mouth 2 (two) times daily. For anxiety    . atorvastatin (LIPITOR) 80 MG tablet Take 80 mg by mouth every evening.    . cholecalciferol (VITAMIN D) 1000 UNITS tablet Take 1,000 Units by mouth daily.    . meloxicam (MOBIC) 15 MG tablet Take 15 mg  by mouth daily.     No current facility-administered medications for this visit.    Allergies as of 10/26/2014 - Review Complete 10/26/2014  Allergen Reaction Noted  . Bee venom Swelling 06/21/2012  . Hydrocodone  03/28/2013  . Penicillins Other (See Comments) 12/19/2010  . Tramadol  10/18/2014  . Sulfa antibiotics Rash 03/28/2013    Past Medical History  Diagnosis Date  . COPD (chronic obstructive pulmonary disease)   . High cholesterol   . Chronic lower back pain   . MRSA infection     Past Surgical History  Procedure Laterality Date  . Back surgery    . Joint replacement      left hip surgery - January 26, 2014  . Rotator cuff repair      November 2014  . Elbow bursa surgery      Family History  Problem Relation Age of Onset  . Diabetes Other   . Heart attack    . Cancer Mother   . Colon cancer Neg Hx   . Liver disease Neg Hx   . Ulcers Neg Hx     History   Social History  . Marital Status: Married    Spouse Name: N/A    Number of Children: N/A  . Years of Education: N/A   Occupational History  . Not on file.   Social History Main Topics  . Smoking status: Current Every Day Smoker -- 1.00 packs/day for 40 years  Types: Cigarettes  . Smokeless tobacco: Never Used  . Alcohol Use: No  . Drug Use: No  . Sexual Activity: Not on file   Other Topics Concern  . Not on file   Social History Narrative      ROS:  General: Negative for anorexia, weight loss, fever, chills, fatigue, weakness. Eyes: Negative for vision changes.  ENT: Negative for hoarseness, difficulty swallowing , nasal congestion. CV: Negative for chest pain, angina, palpitations, dyspnea on exertion, peripheral edema.  Respiratory: Negative for dyspnea at rest, dyspnea on exertion, cough, sputum, wheezing.  GI: See history of present illness. GU:  Negative for dysuria, hematuria, urinary incontinence, urinary frequency, nocturnal urination.  MS: Negative for joint pain, low back  pain.  Derm: Negative for rash or itching.  Neuro: Negative for weakness, abnormal sensation, seizure, frequent headaches, memory loss, confusion.  Psych: Negative for anxiety, depression, suicidal ideation, hallucinations.  Endo: Negative for unusual weight change.  Heme: Negative for bruising or bleeding. Allergy: Negative for rash or hives.    Physical Examination:  BP 147/89 mmHg  Pulse 72  Temp(Src) 98 F (36.7 C)  Ht 5\' 9"  (1.753 m)  Wt 177 lb 9.6 oz (80.559 kg)  BMI 26.22 kg/m2   General: Well-nourished, well-developed in no acute distress.  Head: Normocephalic, atraumatic.   Eyes: Conjunctiva pink, no icterus. Mouth: Oropharyngeal mucosa moist and pink , no lesions erythema or exudate. Neck: Supple without thyromegaly, masses, or lymphadenopathy.  Lungs: Clear to auscultation bilaterally.  Heart: Regular rate and rhythm, no murmurs rubs or gallops.  Abdomen: Bowel sounds are normal, moderate RUQ/epig tenderness, nondistended, no hepatosplenomegaly or masses, no abdominal bruits or    hernia , no rebound or guarding.  Negative Murphy's sign. Rectal: not performed Extremities: No lower extremity edema. No clubbing or deformities.  Neuro: Alert and oriented x 4 , grossly normal neurologically.  Skin: Warm and dry, no rash or jaundice.   Psych: Alert and cooperative, normal mood and affect.  Labs: Lab Results  Component Value Date   WBC 9.7 10/18/2014   HGB 15.0 10/18/2014   HCT 43.4 10/18/2014   MCV 95.2 10/18/2014   PLT 364 10/18/2014   Lab Results  Component Value Date   CREATININE 0.95 10/18/2014   BUN 11 10/18/2014   NA 141 10/18/2014   K 4.0 10/18/2014   CL 104 10/18/2014   CO2 25 10/18/2014   Lab Results  Component Value Date   ALT 16 10/18/2014   AST 18 10/18/2014   ALKPHOS 84 10/18/2014   BILITOT <0.2* 10/18/2014   Lab Results  Component Value Date   LIPASE 13 10/18/2014     Imaging Studies: Ct Angio Chest Pe W/cm &/or Wo Cm  10/09/2014    CLINICAL DATA:  58 year old male with chest pain and shortness of breath.  EXAM: CT ANGIOGRAPHY CHEST WITH CONTRAST  TECHNIQUE: Multidetector CT imaging of the chest was performed using the standard protocol during bolus administration of intravenous contrast. Multiplanar CT image reconstructions and MIPs were obtained to evaluate the vascular anatomy.  CONTRAST:  113mL OMNIPAQUE IOHEXOL 350 MG/ML SOLN  COMPARISON:  09/16/2014 chest radiograph and 03/28/2013 abdominal CT.  FINDINGS: This is a technically adequate study all the rest a 20 motion artifact the lower lungs slightly decreases sensitivity.  No pulmonary emboli are identified.  There is no evidence of thoracic aortic aneurysm or definite dissection.  The heart and great vessels are unremarkable except for mild to moderate coronary artery calcifications.  There is  no evidence of pleural or pericardial effusion.  No enlarged lymph nodes are identified.  Minimal dependent atelectasis noted.  There is no evidence of airspace disease, consolidation, mass, nodule or endobronchial/endotracheal lesion.  No acute or suspicious bony abnormalities are noted.  Fullness of the visualized left intrarenal collecting system is unchanged from 03/28/2013 abdominal CT.  Review of the MIP images confirms the above findings.  IMPRESSION: No evidence of acute abnormality. No evidence of pulmonary emboli or thoracic aortic aneurysm.  Coronary artery disease.   Electronically Signed   By: Hassan Rowan M.D.   On: 10/09/2014 15:51   Ct Abdomen Pelvis W Contrast  10/18/2014   CLINICAL DATA:  Right upper and right lower quadrant pain for 1 month. Nausea and some vomiting.  EXAM: CT ABDOMEN AND PELVIS WITH CONTRAST  TECHNIQUE: Multidetector CT imaging of the abdomen and pelvis was performed using the standard protocol following bolus administration of intravenous contrast.  CONTRAST:  100 mL Omnipaque 300  COMPARISON:  03/28/2013  FINDINGS: Minimal dependent atelectasis is present in  the lung bases. There may be a small hiatal hernia.  The liver, gallbladder, spleen, adrenal glands, and pancreas have an unremarkable enhanced appearance. 1.1 cm right lower pole renal cyst is again seen. Left extrarenal pelvis is again seen. Small bilateral renal parapelvic cysts are noted.  There is suggestion of asymmetric wall thickening in the region the gastric antrum, although evaluation is partially limited by underdistention. This is more prominent than on the prior CT. Oral contrast is present in nondilated loops of proximal small bowel without evidence of obstruction. Scattered colonic diverticulosis is noted without evidence of diverticulitis. Appendix is identified in the right lower quadrant and is unremarkable.  There is advanced aortoiliac atherosclerotic calcification. Mild ectasia of the infrarenal abdominal aorta does not appear significantly changed, measuring up to 2.8 cm in diameter. No free fluid or enlarged lymph nodes are identified. Bladder is unremarkable. Prior left total hip arthroplasty is noted with mild surrounding heterotopic ossification. Advanced facet arthrosis is noted in the lower lumbar spine.  IMPRESSION: 1. Suggestion of wall thickening in the gastric antrum. Correlation with endoscopy may be helpful for further evaluation. 2. No other cause of abdominal pain identified in the abdomen or pelvis.   Electronically Signed   By: Logan Bores   On: 10/18/2014 17:38   Nm Hepato W/eject Fract  10/20/2014   CLINICAL DATA:  Right upper quadrant pain.  EXAM: NUCLEAR MEDICINE HEPATOBILIARY IMAGING WITH GALLBLADDER EF  TECHNIQUE: Sequential images of the abdomen were obtained out to 60 minutes following intravenous administration of radiopharmaceutical. After slow intravenous infusion of 1.6 micrograms Cholecystokinin, gallbladder ejection fraction was determined.  RADIOPHARMACEUTICALS:  5 [place] [id number] Choletec  COMPARISON:  None.  FINDINGS: Normal visualization of the liver and  biliary system. Distention of bowel. Gallbladder ejection fraction at 30 min is 77%. At 30 min, normal ejection fraction is greater than 30%.  The patient did not experience symptoms during CCK infusion.  IMPRESSION: Normal exam.   Electronically Signed   By: Marcello Moores  Register   On: 10/20/2014 12:01

## 2014-10-26 NOTE — Progress Notes (Signed)
cc'ed to pcp °

## 2014-10-27 ENCOUNTER — Other Ambulatory Visit: Payer: Self-pay

## 2014-10-28 ENCOUNTER — Ambulatory Visit (HOSPITAL_COMMUNITY)
Admission: RE | Admit: 2014-10-28 | Discharge: 2014-10-28 | Disposition: A | Discharge status: Home / Self Care | Payer: Medicare Other | Source: Ambulatory Visit | Attending: Gastroenterology | Admitting: Gastroenterology

## 2014-10-28 ENCOUNTER — Encounter (HOSPITAL_COMMUNITY): Payer: Self-pay | Admitting: *Deleted

## 2014-10-28 ENCOUNTER — Encounter (HOSPITAL_COMMUNITY)
Admission: RE | Disposition: A | Discharge status: Home / Self Care | Payer: Self-pay | Source: Ambulatory Visit | Attending: Gastroenterology

## 2014-10-28 DIAGNOSIS — K222 Esophageal obstruction: Secondary | ICD-10-CM | POA: Insufficient documentation

## 2014-10-28 DIAGNOSIS — Z882 Allergy status to sulfonamides status: Secondary | ICD-10-CM | POA: Diagnosis not present

## 2014-10-28 DIAGNOSIS — Z833 Family history of diabetes mellitus: Secondary | ICD-10-CM | POA: Insufficient documentation

## 2014-10-28 DIAGNOSIS — Z800 Family history of malignant neoplasm of digestive organs: Secondary | ICD-10-CM | POA: Diagnosis not present

## 2014-10-28 DIAGNOSIS — K269 Duodenal ulcer, unspecified as acute or chronic, without hemorrhage or perforation: Secondary | ICD-10-CM | POA: Insufficient documentation

## 2014-10-28 DIAGNOSIS — Z8249 Family history of ischemic heart disease and other diseases of the circulatory system: Secondary | ICD-10-CM | POA: Diagnosis not present

## 2014-10-28 DIAGNOSIS — R1013 Epigastric pain: Secondary | ICD-10-CM | POA: Diagnosis present

## 2014-10-28 DIAGNOSIS — M545 Low back pain: Secondary | ICD-10-CM | POA: Insufficient documentation

## 2014-10-28 DIAGNOSIS — Z880 Allergy status to penicillin: Secondary | ICD-10-CM | POA: Insufficient documentation

## 2014-10-28 DIAGNOSIS — J449 Chronic obstructive pulmonary disease, unspecified: Secondary | ICD-10-CM | POA: Diagnosis not present

## 2014-10-28 DIAGNOSIS — F17210 Nicotine dependence, cigarettes, uncomplicated: Secondary | ICD-10-CM | POA: Insufficient documentation

## 2014-10-28 DIAGNOSIS — K449 Diaphragmatic hernia without obstruction or gangrene: Secondary | ICD-10-CM | POA: Insufficient documentation

## 2014-10-28 DIAGNOSIS — Z888 Allergy status to other drugs, medicaments and biological substances status: Secondary | ICD-10-CM | POA: Diagnosis not present

## 2014-10-28 DIAGNOSIS — K210 Gastro-esophageal reflux disease with esophagitis: Secondary | ICD-10-CM | POA: Diagnosis not present

## 2014-10-28 DIAGNOSIS — R1011 Right upper quadrant pain: Secondary | ICD-10-CM

## 2014-10-28 DIAGNOSIS — Z91030 Bee allergy status: Secondary | ICD-10-CM | POA: Diagnosis not present

## 2014-10-28 DIAGNOSIS — E780 Pure hypercholesterolemia: Secondary | ICD-10-CM | POA: Insufficient documentation

## 2014-10-28 DIAGNOSIS — Z885 Allergy status to narcotic agent status: Secondary | ICD-10-CM | POA: Insufficient documentation

## 2014-10-28 DIAGNOSIS — Z8614 Personal history of Methicillin resistant Staphylococcus aureus infection: Secondary | ICD-10-CM | POA: Diagnosis not present

## 2014-10-28 DIAGNOSIS — K2980 Duodenitis without bleeding: Secondary | ICD-10-CM | POA: Diagnosis not present

## 2014-10-28 DIAGNOSIS — K209 Esophagitis, unspecified: Secondary | ICD-10-CM

## 2014-10-28 DIAGNOSIS — G8929 Other chronic pain: Secondary | ICD-10-CM | POA: Insufficient documentation

## 2014-10-28 DIAGNOSIS — R933 Abnormal findings on diagnostic imaging of other parts of digestive tract: Secondary | ICD-10-CM

## 2014-10-28 HISTORY — PX: ESOPHAGOGASTRODUODENOSCOPY: SHX5428

## 2014-10-28 LAB — KOH PREP: KOH PREP: NONE SEEN

## 2014-10-28 SURGERY — EGD (ESOPHAGOGASTRODUODENOSCOPY)
Anesthesia: Moderate Sedation

## 2014-10-28 MED ORDER — PANTOPRAZOLE SODIUM 40 MG PO TBEC: 40.0000 mg | DELAYED_RELEASE_TABLET | Freq: Two times a day (BID) | ORAL | Status: DC

## 2014-10-28 MED ORDER — MIDAZOLAM HCL 5 MG/5ML IJ SOLN
INTRAMUSCULAR | Status: AC
  Filled 2014-10-28: qty 10

## 2014-10-28 MED ORDER — LIDOCAINE VISCOUS 2 % MT SOLN
OROMUCOSAL | Status: AC
  Filled 2014-10-28: qty 15

## 2014-10-28 MED ORDER — PROMETHAZINE HCL 25 MG/ML IJ SOLN
INTRAMUSCULAR | Status: AC
  Filled 2014-10-28: qty 1

## 2014-10-28 MED ORDER — LIDOCAINE VISCOUS 2 % MT SOLN: OROMUCOSAL | Status: DC

## 2014-10-28 MED ORDER — LIDOCAINE VISCOUS 2 % MT SOLN
OROMUCOSAL | Status: DC | PRN
  Administered 2014-10-28: 3 mL via OROMUCOSAL

## 2014-10-28 MED ORDER — MEPERIDINE HCL 100 MG/ML IJ SOLN
INTRAMUSCULAR | Status: DC | PRN
  Administered 2014-10-28: 50 mg via INTRAVENOUS
  Administered 2014-10-28: 25 mg via INTRAVENOUS
  Administered 2014-10-28: 50 mg via INTRAVENOUS

## 2014-10-28 MED ORDER — SIMETHICONE 40 MG/0.6ML PO SUSP
ORAL | Status: DC | PRN
  Administered 2014-10-28: 12:00:00

## 2014-10-28 MED ORDER — MIDAZOLAM HCL 5 MG/5ML IJ SOLN
INTRAMUSCULAR | Status: DC | PRN
  Administered 2014-10-28 (×3): 2 mg via INTRAVENOUS

## 2014-10-28 MED ORDER — MEPERIDINE HCL 100 MG/ML IJ SOLN
INTRAMUSCULAR | Status: AC
  Filled 2014-10-28: qty 2

## 2014-10-28 MED ORDER — SODIUM CHLORIDE 0.9 % IJ SOLN
INTRAMUSCULAR | Status: AC
  Filled 2014-10-28: qty 10

## 2014-10-28 MED ORDER — SODIUM CHLORIDE 0.9 % IV SOLN
INTRAVENOUS | Status: DC
  Administered 2014-10-28: 11:00:00 via INTRAVENOUS

## 2014-10-28 MED ORDER — PROMETHAZINE HCL 25 MG/ML IJ SOLN
25.0000 mg | Freq: Once | INTRAMUSCULAR | Status: AC
  Administered 2014-10-28: 25 mg via INTRAVENOUS

## 2014-10-28 NOTE — Discharge Instructions (Signed)
YOUR ABDOMINAL PAIN IS DUE TO A LARGE ULCER. YOU HAVE ESOPHAGITIS AND MODERATE GASTRITIS. I BIOPSIED YOUR ESOPHAGUS, YOUR STOMACH, AND THE ULCER.   STRICTLY AVOID ASPIRIN, MOBIC, BC/GOODY POWDERS, IBUPROFEN/MOTRIN, OR NAPROXEN/ALEVE FOR 2 WEEKS BECAUSE YOU HAVE A ULCERS IN YOUR STOMACH.  USE TYLENOL AS NEEDED FOR PAIN.  CONTINUE PROTONIX.  TAKE 30 MINUTES PRIOR TO YOUR MEALS TWICE DAILY FOR 3 MOS THEN  ONCE DAILY FOREVER.  FOLLOW A LOW FAT DIET. SEE INFO BELOW.  AVOID SPICY FOOD FOR 2 WEEKS.   FOLLOW UP IN 4 MOS. - Office is closed at time of discharge. Please call [phone] on Monday 10/31/14 to make appt.    UPPER ENDOSCOPY AFTER CARE Read the instructions outlined below and refer to this sheet in the next week. These discharge instructions provide you with general information on caring for yourself after you leave the hospital. While your treatment has been planned according to the most current medical practices available, unavoidable complications occasionally occur. If you have any problems or questions after discharge, call DR. FIELDS, [phone].  ACTIVITY  You may resume your regular activity, but move at a slower pace for the next 24 hours.   Take frequent rest periods for the next 24 hours.   Walking will help get rid of the air and reduce the bloated feeling in your belly (abdomen).   No driving for 24 hours (because of the medicine (anesthesia) used during the test).   You may shower.   Do not sign any important legal documents or operate any machinery for 24 hours (because of the anesthesia used during the test).    NUTRITION  Drink plenty of fluids.   You may resume your normal diet as instructed by your doctor.   Begin with a light meal and progress to your normal diet. Heavy or fried foods are harder to digest and may make you feel sick to your stomach (nauseated).   Avoid alcoholic beverages for 24 hours or as instructed.    MEDICATIONS  You may resume  your normal medications.   WHAT YOU CAN EXPECT TODAY  Some feelings of bloating in the abdomen.   Passage of more gas than usual.    IF YOU HAD A BIOPSY TAKEN DURING THE UPPER ENDOSCOPY:    Eat a soft diet IF YOU HAVE NAUSEA, BLOATING, ABDOMINAL PAIN, OR VOMITING.    FINDING OUT THE RESULTS OF YOUR TEST Not all test results are available during your visit. DR. Oneida Alar WILL CALL YOU WITHIN 14 DAYS OF YOUR PROCEDUE WITH YOUR RESULTS. Do not assume everything is normal if you have not heard from DR. FIELDS IN TWO WEEKS, CALL HER OFFICE AT [phone].  SEEK IMMEDIATE MEDICAL ATTENTION AND CALL THE OFFICE: [phone] IF:  You have more than a spotting of blood in your stool.   Your belly is swollen (abdominal distention).   You are nauseated or vomiting.   You have a temperature over 101F.   You have abdominal pain or discomfort that is severe or gets worse throughout the day.   Ulcer Disease (Peptic Ulcer, Gastric Ulcer, Duodenal Ulcer) You have an ulcer. This may be in your stomach (gastric ulcer) or in the first part of your small bowel, the duodenum (duodenal ulcer). An ulcer is a break in the lining of the stomach or duodenum. The ulcer causes erosion into the deeper tissue.  CAUSES The stomach has a lining to protect itself from the acid that digests food. The lining can be  damaged in two main ways:  The Helico Pylori bacteria (H. Pyolori) can infect the lining of the stomach and cause ulcers.   Nonsteroidal, anti-inflammatory medications (NSAIDS) can cause gastric ulcerations.   Smoking tobacco can increase the acid in the stomach. This can lead to ulcers, and will impair healing of ulcers.   Other factors, such as alcohol use and stress may contribute to ulcer formation.  SYMPTOMS The problems (symptoms) of ulcer disease are usually a burning or gnawing of the mid-upper belly (abdomen). This is often worse on an empty stomach and may get better with food. This  may be associated with feeling sick to your stomach (nausea), bloating, and vomiting.  HOME CARE INSTRUCTIONS Continue regular work and usual activities unless advised otherwise by your caregiver.  Avoid tobacco, alcohol, and caffeine. Tobacco use will decrease and slow the rates of healing.   Avoid foods that seem to aggravate or cause discomfort.    Low-Fat Diet BREADS, CEREALS, PASTA, RICE, DRIED PEAS, AND BEANS These products are high in carbohydrates and most are low in fat. Therefore, they can be increased in the diet as substitutes for fatty foods. They too, however, contain calories and should not be eaten in excess. Cereals can be eaten for snacks as well as for breakfast.   FRUITS AND VEGETABLES It is good to eat fruits and vegetables. Besides being sources of fiber, both are rich in vitamins and some minerals. They help you get the daily allowances of these nutrients. Fruits and vegetables can be used for snacks and desserts.  MEATS Limit lean meat, chicken, [place], and fish to no more than 6 ounces per day. Beef, Pork, and Lamb Use lean cuts of beef, pork, and lamb. Lean cuts include:  Extra-lean ground beef.  Arm roast.  Sirloin tip.  Center-cut ham.  Round steak.  Loin chops.  Rump roast.  Tenderloin.  Trim all fat off the outside of meats before cooking. It is not necessary to severely decrease the intake of red meat, but lean choices should be made. Lean meat is rich in protein and contains a highly absorbable form of iron. Premenopausal women, in particular, should avoid reducing lean red meat because this could increase the risk for low red blood cells (iron-deficiency anemia).  Chicken and [place] These are good sources of protein. The fat of poultry can be reduced by removing the skin and underlying fat layers before cooking. Chicken and [place] can be substituted for lean red meat in the diet. Poultry should not be fried or covered with high-fat sauces. Fish and  Shellfish Fish is a good source of protein. Shellfish contain cholesterol, but they usually are low in saturated fatty acids. The preparation of fish is important. Like chicken and [place], they should not be fried or covered with high-fat sauces. EGGS Egg whites contain no fat or cholesterol. They can be eaten often. Try 1 to 2 egg whites instead of whole eggs in recipes or use egg substitutes that do not contain yolk. MILK AND DAIRY PRODUCTS Use skim or 1% milk instead of 2% or whole milk. Decrease whole milk, natural, and processed cheeses. Use nonfat or low-fat (2%) cottage cheese or low-fat cheeses made from vegetable oils. Choose nonfat or low-fat (1 to 2%) yogurt. Experiment with evaporated skim milk in recipes that call for heavy cream. Substitute low-fat yogurt or low-fat cottage cheese for sour cream in dips and salad dressings. Have at least 2 servings of low-fat dairy products, such as 2 glasses  of skim (or 1%) milk each day to help get your daily calcium intake. FATS AND OILS Reduce the total intake of fats, especially saturated fat. Butterfat, lard, and beef fats are high in saturated fat and cholesterol. These should be avoided as much as possible. Vegetable fats do not contain cholesterol, but certain vegetable fats, such as coconut oil, palm oil, and palm kernel oil are very high in saturated fats. These should be limited. These fats are often used in bakery goods, processed foods, popcorn, oils, and nondairy creamers. Vegetable shortenings and some peanut butters contain hydrogenated oils, which are also saturated fats. Read the labels on these foods and check for saturated vegetable oils. Unsaturated vegetable oils and fats do not raise blood cholesterol. However, they should be limited because they are fats and are high in calories. Total fat should still be limited to 30% of your daily caloric intake. Desirable liquid vegetable oils are corn oil, cottonseed oil, olive oil, canola oil,  safflower oil, soybean oil, and sunflower oil. Peanut oil is not as good, but small amounts are acceptable. Buy a heart-healthy tub margarine that has no partially hydrogenated oils in the ingredients. Mayonnaise and salad dressings often are made from unsaturated fats, but they should also be limited because of their high calorie and fat content. Seeds, nuts, peanut butter, olives, and avocados are high in fat, but the fat is mainly the unsaturated type. These foods should be limited mainly to avoid excess calories and fat. OTHER EATING TIPS Snacks  Most sweets should be limited as snacks. They tend to be rich in calories and fats, and their caloric content outweighs their nutritional value. Some good choices in snacks are graham crackers, melba toast, soda crackers, bagels (no egg), English muffins, fruits, and vegetables. These snacks are preferable to snack crackers, [place] fries, TORTILLA CHIPS, and POTATO chips. Popcorn should be air-popped or cooked in small amounts of liquid vegetable oil. Desserts Eat fruit, low-fat yogurt, and fruit ices instead of pastries, cake, and cookies. Sherbet, angel food cake, gelatin dessert, frozen low-fat yogurt, or other frozen products that do not contain saturated fat (pure fruit juice bars, frozen ice pops) are also acceptable.  COOKING METHODS Choose those methods that use little or no fat. They include: Poaching.  Braising.  Steaming.  Grilling.  Baking.  Stir-frying.  Broiling.  Microwaving.  Foods can be cooked in a nonstick pan without added fat, or use a nonfat cooking spray in regular cookware. Limit fried foods and avoid frying in saturated fat. Add moisture to lean meats by using water, broth, cooking wines, and other nonfat or low-fat sauces along with the cooking methods mentioned above. Soups and stews should be chilled after cooking. The fat that forms on top after a few hours in the refrigerator should be skimmed off. When preparing meals,  avoid using excess salt. Salt can contribute to raising blood pressure in some people.  EATING AWAY FROM HOME Order entres, potatoes, and vegetables without sauces or butter. When meat exceeds the size of a deck of cards (3 to 4 ounces), the rest can be taken home for another meal. Choose vegetable or fruit salads and ask for low-calorie salad dressings to be served on the side. Use dressings sparingly. Limit high-fat toppings, such as bacon, crumbled eggs, cheese, sunflower seeds, and olives. Ask for heart-healthy tub margarine instead of butter.

## 2014-10-28 NOTE — H&P (Addendum)
Primary Care Physician:  Raiford Simmonds., PA-C Primary Gastroenterologist:  Dr. Oneida Alar  Pre-Procedure History & Physical: HPI:  Garrett Klein is a 58 y.o. male here for RUQ/EPIGASTRIC PAIN FOLLOWED BY NAUSEA AND VOMITING-DEC 2015 Abnormal CT scan: THICKENED ANTRUM.  Past Medical History  Diagnosis Date  . COPD (chronic obstructive pulmonary disease)   . High cholesterol   . Chronic lower back pain   . MRSA infection     Past Surgical History  Procedure Laterality Date  . Back surgery    . Joint replacement      left hip surgery - January 26, 2014  . Rotator cuff repair      November 2014  . Elbow bursa surgery      Prior to Admission medications   Medication Sig Start Date End Date Taking? Authorizing Provider  albuterol (PROVENTIL HFA;VENTOLIN HFA) 108 (90 BASE) MCG/ACT inhaler Inhale 2 puffs into the lungs every 6 (six) hours as needed for wheezing or shortness of breath.   Yes Historical Provider, MD  albuterol (PROVENTIL) (2.5 MG/3ML) 0.083% nebulizer solution Take 2.5 mg by nebulization every 6 (six) hours as needed for wheezing or shortness of breath.   Yes Historical Provider, MD  ALPRAZolam [name]) 1 MG tablet Take 1 mg by mouth 2 (two) times daily. For anxiety   Yes Historical Provider, MD  atorvastatin (LIPITOR) 80 MG tablet Take 80 mg by mouth every evening.   Yes Historical Provider, MD  cholecalciferol (VITAMIN D) 1000 UNITS tablet Take 1,000 Units by mouth 4 (four) times daily.    Yes Historical Provider, MD  meloxicam (MOBIC) 15 MG tablet Take 15 mg by mouth daily.   Yes Historical Provider, MD  pantoprazole (PROTONIX) 40 MG tablet Take 1 tablet (40 mg total) by mouth 2 (two) times daily. 10/26/14  Yes [name], PA-C  EPINEPHrine 0.3 mg/0.3 mL IJ SOAJ injection Inject 0.3 mg into the muscle as needed (allergic reaction).    Historical Provider, MD    Allergies as of 10/26/2014 - Review Complete 10/26/2014  Allergen Reaction Noted  . Bee venom Swelling  06/21/2012  . Hydrocodone  03/28/2013  . Penicillins Other (See Comments) 12/19/2010  . Tramadol  10/18/2014  . Sulfa antibiotics Rash 03/28/2013    Family History  Problem Relation Age of Onset  . Diabetes Other   . Heart attack    . Cancer Mother   . Colon cancer Neg Hx   . Liver disease Neg Hx   . Ulcers Neg Hx     History   Social History  . Marital Status: Married    Spouse Name: N/A    Number of Children: N/A  . Years of Education: N/A   Occupational History  . Not on file.   Social History Main Topics  . Smoking status: Current Every Day Smoker -- 1.00 packs/day for 40 years    Types: Cigarettes  . Smokeless tobacco: Never Used  . Alcohol Use: No  . Drug Use: No  . Sexual Activity: Not on file   Other Topics Concern  . Not on file   Social History Narrative    Review of Systems: See HPI, otherwise negative ROS   Physical Exam: BP 133/78 mmHg  Pulse 60  Temp(Src) 98.7 F (37.1 C) (Oral)  Resp 20  Ht 5\' 9"  (1.753 m)  Wt 177 lb (80.287 kg)  BMI 26.13 kg/m2  SpO2 100% General:   Alert,  pleasant and cooperative in NAD Head:  Normocephalic and  atraumatic. Neck:  Supple; Lungs:  Clear throughout to auscultation.    Heart:  Regular rate and rhythm. Abdomen:  Soft, nontender and nondistended. Normal bowel sounds, without guarding, and without rebound.   Neurologic:  Alert and  oriented x4;  grossly normal neurologically.  Impression/Plan:    Abnormal CT scan  Plan:  EGD/?PYLORIC CHANNEL DILATION TODAY

## 2014-10-28 NOTE — Op Note (Signed)
[facility] [address] [place], [address]   ENDOSCOPY PROCEDURE REPORT  PATIENT: Garrett Klein, Garrett Klein  MR#: [id number] BIRTHDATE: [birth year] , 70  yrs. old GENDER: male  ENDOSCOPIST: Barney Drain, MD REFERRED CB:[id number] Muse, PA PROCEDURE DATE: 2014/11/04 PROCEDURE:   EGD w/ biopsy INDICATIONS:epigastric pain.   nausea.   vomiting.   weight loss. MEDICATIONS: Demerol 125 mg IV, Versed 6 mg IV, and Promethazine (Phenergan) 25 mg IV TOPICAL ANESTHETIC:   Viscous Xylocaine ASA CLASS:  DESCRIPTION OF PROCEDURE:     Physical exam was performed.  Informed consent was obtained from the patient after explaining the benefits, risks, and alternatives to the procedure.  The patient was connected to the monitor and placed in the left lateral position.  Continuous oxygen was provided by nasal cannula and IV medicine administered through an indwelling cannula.  After administration of sedation, the patients esophagus was intubated and the EG-[id number] ([id number])  endoscope was advanced under direct visualization to the second portion of the duodenum.  The scope was removed slowly by carefully examining the color, texture, anatomy, and integrity of the mucosa on the way out.  The patient was recovered in endoscopy and discharged home in satisfactory condition.   ESOPHAGUS: WHITE PLAQUES IN DISTAL ESOPHAGUS WITH LINEAR EROSIONS. BRUSH BIOPSY OBTAINED.  DISTAL ESOPHAGEAL STRICTURE.   STOMACH: A small hiatal hernia was noted.   Moderate erosive gastritis (inflammation) was found in the entire examined stomach.  Multiple biopsies were performed using cold forceps.   ULCER APPEARED TO BEGIN INPYLORUS AND EXTEND INTO THE DUODENUM.  COLD BIOPSIES OBTAINED OF PERIPHERY(6) AND CENTER(2).   DUODENUM: MILD DUODENITIS IN THE BULB.   The duodenal mucosa showed no abnormalities in the 2nd part of the duodenum. COMPLICATIONS: There were no immediate complications.  ENDOSCOPIC  IMPRESSION: 1.   ESOPHAGITIS DUE TO GERD AND/OR CANDIDA 2.   Small hiatal hernia 3.   MODERATE Erosive gastritis 4.   RUQ PAIN DUE TO LARGE ULCER 5.   MILD DUODENITIS IN THE BULB  RECOMMENDATIONS: BID PPI. VISCOUS LIDOCAINE QAC AND HS PRN SEE ACUPUNTURIST FOR PAIN CONTROL AVOID MOBIC/GOODY POWDERS FOR 2 WEEKS AWAIT BIOPSY LOW FAT DIET AVOID SPICY FOODS. CONSIDER REPEAT EGD TO ASSESS HEAING IF BIOPSIES SHOW GASTRIC MUCOSA. OPV IN 4 MOS  REPEAT EXAM:    _______________________________ eSignedBarney Drain, MD 2014/11/04 4:54 PM CPT CODES: ICD CODES:  The ICD and CPT codes recommended by this software are interpretations from the data that the clinical staff has captured with the software.  The verification of the translation of this report to the ICD and CPT codes and modifiers is the sole responsibility of the health care institution and practicing physician where this report was generated.  [employer] will not be held responsible for the validity of the ICD and CPT codes included on this report.  AMA assumes no liability for data contained or not contained herein. CPT is a Designer, television/film set of the [employer].

## 2014-10-28 NOTE — Progress Notes (Signed)
REVIEWED-NO ADDITIONAL RECOMMENDATIONS. 

## 2014-10-31 ENCOUNTER — Encounter (HOSPITAL_COMMUNITY): Payer: Self-pay | Admitting: Gastroenterology

## 2014-11-01 ENCOUNTER — Other Ambulatory Visit: Payer: Self-pay | Admitting: Gastroenterology

## 2014-11-01 ENCOUNTER — Telehealth: Payer: Self-pay | Admitting: Gastroenterology

## 2014-11-01 MED ORDER — LIDOCAINE VISCOUS 2 % MT SOLN: OROMUCOSAL | Status: DC

## 2014-11-01 MED ORDER — PANTOPRAZOLE SODIUM 40 MG PO TBEC: 40.0000 mg | DELAYED_RELEASE_TABLET | Freq: Two times a day (BID) | ORAL | Status: DC

## 2014-11-01 NOTE — Telephone Encounter (Signed)
Patient's wife called about medication questions and [facility]. I told wife that I could fax the OV note, procedure and path report to them and DS was not in this week and I would transfer her call to JL for her to explain about the patient's medication. She agreed and call transferred.

## 2014-11-01 NOTE — Telephone Encounter (Signed)
Noted. Agree.

## 2014-11-01 NOTE — Telephone Encounter (Signed)
Pt called and left a voicemail- he needs prescriptions printed and sent to the [facility] in [place] at fax number- [phone] attention Santiago Glad. Per AS ok to redo rx's and print them to send to [facility]. Called [facility] and cancelled rx's there.

## 2014-11-02 NOTE — Telephone Encounter (Signed)
pts wife is aware that rx's have been faxed.

## 2014-11-21 ENCOUNTER — Telehealth: Payer: Self-pay | Admitting: Gastroenterology

## 2014-11-21 NOTE — Telephone Encounter (Signed)
Please call pt. His stomach Bx shows ULCERS FROM [employer]IR[employer]ODU[employer].    USE TYLENOL AS NEEDED FOR PAIN.  CONTINUE PROTONIX.  TAKE 30 MINUTES PRIOR TO YOUR MEALS TWICE DAILY FOR 3 MOS THEN  ONCE DAILY FOREVER.  FOLLOW A LOW FAT DIET.    REPEAT EGD IN [facility] 2016.  FOLLOW UP IN MAY 2016 E30 GASTRIC ULCER, ABD PAIN, NAUSEA.

## 2014-11-21 NOTE — Telephone Encounter (Signed)
ON RECALL LIST  °

## 2014-11-21 NOTE — Telephone Encounter (Signed)
Pts wife is aware of results.

## 2014-11-22 NOTE — Telephone Encounter (Signed)
Reminder in epic °

## 2014-11-28 ENCOUNTER — Telehealth: Payer: Self-pay | Admitting: Gastroenterology

## 2014-11-28 NOTE — Telephone Encounter (Signed)
Pt is aware I am sending to the refill box.

## 2014-11-28 NOTE — Telephone Encounter (Signed)
Pt's wife called to let us know that their new insurance will not cover his pantoprazole 40 mg that he needs to be on the rest of his life.  She is asking if he could take nexium or omeprazole in the place of the pantoprazole.  Also, she is asking if patient can get refills on Lidocaine 2%. He uses [facility] and would like a call back sometime today. [phone]

## 2014-11-29 ENCOUNTER — Telehealth: Payer: Self-pay | Admitting: Gastroenterology

## 2014-11-29 MED ORDER — OMEPRAZOLE 20 MG PO CPDR: 20.0000 mg | DELAYED_RELEASE_CAPSULE | Freq: Two times a day (BID) | ORAL | Status: DC

## 2014-11-29 NOTE — Telephone Encounter (Signed)
RX addressed. See separate phone note from yesterday.

## 2014-11-29 NOTE — Telephone Encounter (Signed)
Called. Many rings and no answer.  

## 2014-11-29 NOTE — Addendum Note (Signed)
Addended by: [name] on: 11/29/2014 01:01 PM   Modules accepted: Orders, Medications

## 2014-11-29 NOTE — Telephone Encounter (Signed)
Pt's wife Constance Holster) has called back again today asking about the phone note from yesterday.  I told her that the nurse is aware and it's been sent to the refill box and someone would be getting to that today. She would like a return call to let her know it's been taken care of and if she isn't home to [facility]. [phone]

## 2014-11-29 NOTE — Telephone Encounter (Signed)
He should have refills the the Lidocaine. He was given 3 back on 11/01/14. Omeprazole sent in . His insurance info was not available so I'm not sure what is on his formulary.

## 2014-11-29 NOTE — Telephone Encounter (Signed)
Tried to call pt. MANY RINGS and no answer. Randall Hiss will be working on refills this afternoon).

## 2014-11-30 MED ORDER — LIDOCAINE VISCOUS 2 % MT SOLN: OROMUCOSAL | Status: DC

## 2014-11-30 NOTE — Telephone Encounter (Signed)
Pt's wife called today and he does not have refills. I called the pharmacist at [facility] in [place] and she said the 200 ml was only a 5 day supply. He has used his refills and the last time it was filled was 11/20/2014.  Please advise or do I need to send to Dr. Oneida Alar?

## 2014-11-30 NOTE — Telephone Encounter (Signed)
Pts wife is aware 

## 2014-11-30 NOTE — Telephone Encounter (Signed)
RX given. Patient should use as needed only.

## 2014-11-30 NOTE — Addendum Note (Signed)
Addended by: [name] on: 11/30/2014 01:15 PM   Modules accepted: Orders

## 2014-12-01 ENCOUNTER — Ambulatory Visit: Payer: Medicare Other | Admitting: Gastroenterology

## 2015-01-16 ENCOUNTER — Encounter: Payer: Self-pay | Admitting: Gastroenterology

## 2015-03-02 ENCOUNTER — Encounter: Payer: Self-pay | Admitting: Gastroenterology

## 2015-03-02 ENCOUNTER — Encounter: Payer: Medicare Other | Admitting: Gastroenterology

## 2015-03-02 ENCOUNTER — Telehealth: Payer: Self-pay | Admitting: Gastroenterology

## 2015-03-02 NOTE — Telephone Encounter (Signed)
REVIEWED-NO ADDITIONAL RECOMMENDATIONS. 

## 2015-03-02 NOTE — Progress Notes (Signed)
   Subjective:    Patient ID: Garrett Klein, male    DOB: [birth year], 59 y.o.   MRN: [id number]  MUSE,ROCHELLE D., PA-C   HPI   Past Medical History  Diagnosis Date  . COPD (chronic obstructive pulmonary disease)   . High cholesterol   . Chronic lower back pain   . MRSA infection    Past Surgical History  Procedure Laterality Date  . Back surgery    . Joint replacement      left hip surgery - January 26, 2014  . Rotator cuff repair      November 2014  . Elbow bursa surgery    . Esophagogastroduodenoscopy N/A 10/28/2014    SLF: 1. Esophagitis due to GERD and/or candida 2. Small hiatal hernia 3. Moderate erosive gastritis 4. RUQ pain due to large ulcer 5. [place] duodentis in teh bulb   Allergies  Allergen Reactions  . Bee Venom Swelling  . Hydrocodone     Rash   . Penicillins Other (See Comments)    Reaction in childhood, caused patient to pass out  . Tramadol     Hives   . Sulfa Antibiotics Rash     Review of Systems     Objective:   Physical Exam        Assessment & Plan:

## 2015-03-02 NOTE — Telephone Encounter (Signed)
PATIENT WAS A NO SHOW 03/02/15 AND LETTER WAS SENT

## 2015-03-15 ENCOUNTER — Encounter: Payer: Self-pay | Admitting: Gastroenterology

## 2015-04-06 ENCOUNTER — Ambulatory Visit: Payer: Medicare Other | Admitting: Gastroenterology

## 2015-04-07 ENCOUNTER — Ambulatory Visit: Payer: Medicare Other | Admitting: Gastroenterology

## 2015-05-05 ENCOUNTER — Other Ambulatory Visit: Payer: Self-pay

## 2015-05-05 ENCOUNTER — Encounter: Payer: Self-pay | Admitting: Gastroenterology

## 2015-05-05 ENCOUNTER — Ambulatory Visit (INDEPENDENT_AMBULATORY_CARE_PROVIDER_SITE_OTHER): Payer: Medicare Other | Admitting: Gastroenterology

## 2015-05-05 VITALS — BP 121/79 | HR 73 | Temp 98.1°F | Ht 70.0 in | Wt 181.0 lb

## 2015-05-05 DIAGNOSIS — R1314 Dysphagia, pharyngoesophageal phase: Secondary | ICD-10-CM

## 2015-05-05 DIAGNOSIS — K279 Peptic ulcer, site unspecified, unspecified as acute or chronic, without hemorrhage or perforation: Secondary | ICD-10-CM | POA: Diagnosis not present

## 2015-05-05 DIAGNOSIS — R1011 Right upper quadrant pain: Secondary | ICD-10-CM | POA: Diagnosis not present

## 2015-05-05 DIAGNOSIS — K208 Other esophagitis: Secondary | ICD-10-CM

## 2015-05-05 DIAGNOSIS — K529 Noninfective gastroenteritis and colitis, unspecified: Secondary | ICD-10-CM

## 2015-05-05 DIAGNOSIS — R1319 Other dysphagia: Secondary | ICD-10-CM

## 2015-05-05 DIAGNOSIS — R1310 Dysphagia, unspecified: Secondary | ICD-10-CM | POA: Insufficient documentation

## 2015-05-05 DIAGNOSIS — K2210 Ulcer of esophagus without bleeding: Secondary | ICD-10-CM

## 2015-05-05 DIAGNOSIS — K52831 Collagenous colitis: Secondary | ICD-10-CM | POA: Insufficient documentation

## 2015-05-05 MED ORDER — DICYCLOMINE HCL 10 MG PO CAPS: ORAL_CAPSULE | ORAL | Status: DC

## 2015-05-05 NOTE — Progress Notes (Signed)
Please let patient know that I was reviewing his xrays again. He had CT last 09/2014 of the chest. It showed mild to moderate calcifications in the coronary arteries.   Has he ever seen a cardiologist? He needs to be given copy of the CTA chest 10/09/14 report and follow up with his cardiologist or PCP.

## 2015-05-05 NOTE — Assessment & Plan Note (Signed)
Chronic diarrhea, postprandially. No nocturnal stools or rectal bleeding. Possible IBS. consider celiac disease. Obtain copy of the last 2 colonoscopies and pathology from [facility]. Screen for celiac disease. Trial of Bentyl 1 to 2 about 30 minutes before meals and at bedtime as needed, no more than 4 times daily.

## 2015-05-05 NOTE — Assessment & Plan Note (Signed)
History of erosive reflux esophagitis and esophageal stricture. Recent food impaction. Consider esophageal dilation at time of EGD if appropriate.  I have discussed the risks, alternatives, benefits with regards to but not limited to the risk of reaction to medication, bleeding, infection, perforation and the patient is agreeable to proceed. Written consent to be obtained.

## 2015-05-05 NOTE — Patient Instructions (Addendum)
1. Please have your labs done today or Monday. 2. Upper endoscopy as scheduled. See separate instructions.  3. I will request a copy of your colonoscopy report from [facility] for review.  4. Trial of bentyl, 1-2 capsules 30 minutes before each meal and at bedtime as needed (no more than four times per day).

## 2015-05-05 NOTE — Assessment & Plan Note (Signed)
59 year old gentleman with history of large ulcer beginning in the pyloric channel and extended into the duodenum in the setting of Mobic and goody powders use and no PPI therapy. Patient failed to follow-up for three-month EGD in March. Continues to have daily pain although improved. No further vomiting. On omeprazole twice a day. Has used Goody's powders a few times, wife feels that he is used more than he is letting on. Patient states last time was about 3-4 weeks ago and was just 1.  Plan for follow-up EGD. Augment conscious sedation with Phenergan 25 mg IV 30 minutes before the procedure.  I have discussed the risks, alternatives, benefits with regards to but not limited to the risk of reaction to medication, bleeding, infection, perforation and the patient is agreeable to proceed. Written consent to be obtained.  Avoid aspirin powders, NSAIDs for now. Continue omeprazole twice a day.

## 2015-05-05 NOTE — Progress Notes (Signed)
Primary Care Physician:  Raiford Simmonds., PA-C  Primary Gastroenterologist:  Barney Drain, MD   Chief Complaint  Patient presents with  . Follow-up    HPI:  Garrett Klein is a 59 y.o. male here for follow-up of peptic ulcer disease, erosive reflux esophagitis. Patient was seen back in December with abdominal pain. Pain in the right upper quadrant and epigastrium. Associated with vomiting. Admitted to [facility] use for 12 months, Goody's powders 56 daily with no PPI therapy. CT head showed asymmetrical wall thickening in the region of the gastric antrum. HIDA scan negative.   Patient underwent EGD with Dr. Oneida Alar. Noted to have a large ulcer which appeared to begin in the pylorus and extended into the duodenum. Mild duodenitis and gastritis. White plaques in the distal esophagus with linear erosions, KOH was negative. He had a distal esophageal stricture.  Patient was post come back in March for follow-up EGD but he missed his appointment.  Complains of recent dysphagia to solid food, breads and meats. Had an episode with food impaction which lasted for several hours but eventually went away. Denies heartburn. Continues to have right upper quadrant pain constantly. It does not seem to be affected by meal. No vomiting. Pain is not as painful as before but still present. Denies any blood in the stool or melena. Typically has 3-4 bowel movements daily, chronically, postprandially. No nocturnal diarrhea. Rarely has a solid stool.  Patient states he rarely uses Goody's powders but his wife feels that he is using the more than his admitting. Denies any other NSAIDs. Taking omeprazole 20 mg twice daily religiously.  Patient reports his last colonoscopy was about a year ago at [facility] and would like for meter of retrieve a copy for review. He states he was told come back in 5 years and plans to have it done there again. Dysphagia to solid food, bread and meats. First time in along time that it happened.  Couldn't get it out. Few hours.    Current Outpatient Prescriptions  Medication Sig Dispense Refill  . albuterol (PROVENTIL HFA;VENTOLIN HFA) 108 (90 BASE) MCG/ACT inhaler Inhale 2 puffs into the lungs every 6 (six) hours as needed for wheezing or shortness of breath.    Marland Kitchen albuterol (PROVENTIL) (2.5 MG/3ML) 0.083% nebulizer solution Take 2.5 mg by nebulization every 6 (six) hours as needed for wheezing or shortness of breath.    . ALPRAZolam (XANAX) 1 MG tablet Take 1 mg by mouth 2 (two) times daily. For anxiety    . atorvastatin (LIPITOR) 80 MG tablet Take 80 mg by mouth every evening.    . cholecalciferol (VITAMIN D) 1000 UNITS tablet Take 1,000 Units by mouth 4 (four) times daily.     Marland Kitchen EPINEPHrine 0.3 mg/0.3 mL IJ SOAJ injection Inject 0.3 mg into the muscle as needed (allergic reaction).    Marland Kitchen lidocaine (XYLOCAINE) 2 % solution 2 TSP  PO QAC AND HS TO PREVENT ABDOMINAL PAIN AFTER EATING as needed. 200 mL 3  . omeprazole (PRILOSEC) 20 MG capsule Take 1 capsule (20 mg total) by mouth 2 (two) times daily before a meal. 60 capsule 3  . SYMBICORT 80-4.5 MCG/ACT inhaler     . pantoprazole (PROTONIX) 40 MG tablet Take 1 tablet (40 mg total) by mouth 2 (two) times daily before a meal. (Patient not taking: Reported on 05/05/2015) 60 tablet 11   No current facility-administered medications for this visit.    Allergies as of 05/05/2015 - Review Complete 05/05/2015  Allergen  Reaction Noted  . Bee venom Swelling 06/21/2012  . Hydrocodone  03/28/2013  . Penicillins Other (See Comments) 12/19/2010  . Tramadol  10/18/2014  . Sulfa antibiotics Rash 03/28/2013    Past Medical History  Diagnosis Date  . COPD (chronic obstructive pulmonary disease)   . High cholesterol   . Chronic lower back pain   . MRSA infection   . PUD (peptic ulcer disease)     Past Surgical History  Procedure Laterality Date  . Back surgery    . Joint replacement      left hip surgery - January 26, 2014  . Rotator cuff  repair      November 2014  . Elbow bursa surgery    . Esophagogastroduodenoscopy N/A 10/28/2014    SLF: 1. Esophagitis due to GERD. KOH neg. 2. Small hiatal hernia 3. Moderate erosive gastritis 4. RUQ pain due to large ulcer 5. [place] duodentis in the bulb.   . Colonoscopy  2001    Dr. Sharlett Iles, normal  . Colonoscopy  ?2013    [facility] [place]: h/o polyps per patient.     Family History  Problem Relation Age of Onset  . Diabetes Other   . Heart attack    . Cancer Mother   . Colon cancer Neg Hx   . Liver disease Neg Hx   . Ulcers Neg Hx     History   Social History  . Marital Status: Married    Spouse Name: N/A  . Number of Children: N/A  . Years of Education: N/A   Occupational History  . Not on file.   Social History Main Topics  . Smoking status: Current Every Day Smoker -- 1.00 packs/day for 40 years    Types: Cigarettes  . Smokeless tobacco: Never Used  . Alcohol Use: No  . Drug Use: No  . Sexual Activity: Not on file   Other Topics Concern  . Not on file   Social History Narrative      ROS:  General: Negative for anorexia, weight loss, fever, chills, fatigue, weakness. Eyes: Negative for vision changes.  ENT: Negative for hoarseness, difficulty swallowing , nasal congestion. CV: Negative for chest pain, angina, palpitations, dyspnea on exertion, peripheral edema.  Respiratory: Negative for dyspnea at rest, dyspnea on exertion, cough, sputum, wheezing.  GI: See history of present illness. GU:  Negative for dysuria, hematuria, urinary incontinence, urinary frequency, nocturnal urination.  MS: Negative for joint pain, low back pain.  Derm: Negative for rash or itching.  Neuro: Negative for weakness, abnormal sensation, seizure, frequent headaches, memory loss, confusion.  Psych: Negative for anxiety, depression, suicidal ideation, hallucinations.  Endo: Negative for unusual weight change.  Heme: Negative for bruising or bleeding. Allergy: Negative for rash or  hives.    Physical Examination:  BP 121/79 mmHg  Pulse 73  Temp(Src) 98.1 F (36.7 C) (Oral)  Ht 5\' 10"  (1.778 m)  Wt 181 lb (82.101 kg)  BMI 25.97 kg/m2   General: Well-nourished, well-developed in no acute distress.  Head: Normocephalic, atraumatic.   Eyes: Conjunctiva pink, no icterus. Mouth: Oropharyngeal mucosa moist and pink , no lesions erythema or exudate. Neck: Supple without thyromegaly, masses, or lymphadenopathy.  Lungs: Clear to auscultation bilaterally.  Heart: Regular rate and rhythm, no murmurs rubs or gallops.  Abdomen: Bowel sounds are normal, nontender, nondistended, no hepatosplenomegaly or masses, no abdominal bruits or    hernia , no rebound or guarding.   Rectal: Not performed Extremities: No lower extremity edema. No  clubbing or deformities.  Neuro: Alert and oriented x 4 , grossly normal neurologically.  Skin: Warm and dry, no rash or jaundice.   Psych: Alert and cooperative, normal mood and affect.   Imaging Studies: No results found.

## 2015-05-06 LAB — IGA: IgA: 97 mg/dL (ref 68–379)

## 2015-05-08 NOTE — Progress Notes (Signed)
I called and informed pt's wife. She said she takes care of all of his medical appts, etc. She requested a copy of the CT sent to them and they will follow up with PCP. She said he sees Dr. Brigitte Pulse and the [facility] in [place], and Royce Macadamia is listed. She said they will follow up with one of them. Copy mailed.

## 2015-05-08 NOTE — Progress Notes (Signed)
cc'd to pcp 

## 2015-05-09 LAB — TISSUE TRANSGLUTAMINASE, IGA: TISSUE TRANSGLUTAMINASE AB, IGA: 1 U/mL (ref <4)

## 2015-05-09 NOTE — Progress Notes (Signed)
Quick Note:  Tried to call both numbers and could not get pt. ______

## 2015-05-09 NOTE — Progress Notes (Signed)
Quick Note:  Celiac screen negative. egd as planned. ______

## 2015-05-11 ENCOUNTER — Telehealth: Payer: Self-pay | Admitting: Gastroenterology

## 2015-05-11 NOTE — Telephone Encounter (Signed)
CTA from 10/09/2014 faxed to Dr Trena Platt office.

## 2015-05-12 NOTE — Progress Notes (Signed)
Quick Note:  Tried both numbers again and could not get calls through. Mailed letter with the info. ______

## 2015-05-19 ENCOUNTER — Ambulatory Visit (HOSPITAL_COMMUNITY)
Admission: RE | Admit: 2015-05-19 | Discharge: 2015-05-19 | Disposition: A | Discharge status: Home / Self Care | Payer: Medicare Other | Source: Ambulatory Visit | Attending: Gastroenterology | Admitting: Gastroenterology

## 2015-05-19 ENCOUNTER — Encounter (HOSPITAL_COMMUNITY)
Admission: RE | Disposition: A | Discharge status: Home / Self Care | Payer: Self-pay | Source: Ambulatory Visit | Attending: Gastroenterology

## 2015-05-19 ENCOUNTER — Encounter (HOSPITAL_COMMUNITY): Payer: Self-pay | Admitting: *Deleted

## 2015-05-19 DIAGNOSIS — Z91030 Bee allergy status: Secondary | ICD-10-CM | POA: Insufficient documentation

## 2015-05-19 DIAGNOSIS — R1013 Epigastric pain: Secondary | ICD-10-CM | POA: Insufficient documentation

## 2015-05-19 DIAGNOSIS — R1314 Dysphagia, pharyngoesophageal phase: Secondary | ICD-10-CM | POA: Insufficient documentation

## 2015-05-19 DIAGNOSIS — Z79899 Other long term (current) drug therapy: Secondary | ICD-10-CM | POA: Insufficient documentation

## 2015-05-19 DIAGNOSIS — K222 Esophageal obstruction: Secondary | ICD-10-CM | POA: Diagnosis not present

## 2015-05-19 DIAGNOSIS — E780 Pure hypercholesterolemia: Secondary | ICD-10-CM | POA: Diagnosis not present

## 2015-05-19 DIAGNOSIS — Z880 Allergy status to penicillin: Secondary | ICD-10-CM | POA: Diagnosis not present

## 2015-05-19 DIAGNOSIS — Z8711 Personal history of peptic ulcer disease: Secondary | ICD-10-CM | POA: Insufficient documentation

## 2015-05-19 DIAGNOSIS — R1310 Dysphagia, unspecified: Secondary | ICD-10-CM | POA: Diagnosis not present

## 2015-05-19 DIAGNOSIS — K279 Peptic ulcer, site unspecified, unspecified as acute or chronic, without hemorrhage or perforation: Secondary | ICD-10-CM

## 2015-05-19 DIAGNOSIS — K2210 Ulcer of esophagus without bleeding: Secondary | ICD-10-CM

## 2015-05-19 DIAGNOSIS — F17210 Nicotine dependence, cigarettes, uncomplicated: Secondary | ICD-10-CM | POA: Diagnosis not present

## 2015-05-19 DIAGNOSIS — I10 Essential (primary) hypertension: Secondary | ICD-10-CM | POA: Insufficient documentation

## 2015-05-19 DIAGNOSIS — J449 Chronic obstructive pulmonary disease, unspecified: Secondary | ICD-10-CM | POA: Diagnosis not present

## 2015-05-19 DIAGNOSIS — Z8614 Personal history of Methicillin resistant Staphylococcus aureus infection: Secondary | ICD-10-CM | POA: Insufficient documentation

## 2015-05-19 DIAGNOSIS — K2970 Gastritis, unspecified, without bleeding: Secondary | ICD-10-CM | POA: Insufficient documentation

## 2015-05-19 DIAGNOSIS — Z882 Allergy status to sulfonamides status: Secondary | ICD-10-CM | POA: Diagnosis not present

## 2015-05-19 DIAGNOSIS — K209 Esophagitis, unspecified: Secondary | ICD-10-CM | POA: Insufficient documentation

## 2015-05-19 DIAGNOSIS — G8929 Other chronic pain: Secondary | ICD-10-CM | POA: Insufficient documentation

## 2015-05-19 DIAGNOSIS — Z888 Allergy status to other drugs, medicaments and biological substances status: Secondary | ICD-10-CM | POA: Insufficient documentation

## 2015-05-19 HISTORY — PX: ESOPHAGOGASTRODUODENOSCOPY: SHX5428

## 2015-05-19 HISTORY — PX: ESOPHAGEAL DILATION: SHX303

## 2015-05-19 LAB — COMPREHENSIVE METABOLIC PANEL
ALT: 14 U/L — ABNORMAL LOW (ref 17–63)
ANION GAP: 7 (ref 5–15)
AST: 15 U/L (ref 15–41)
Albumin: 3.9 g/dL (ref 3.5–5.0)
Alkaline Phosphatase: 66 U/L (ref 38–126)
BUN: 16 mg/dL (ref 6–20)
CO2: 29 mmol/L (ref 22–32)
Calcium: 8.8 mg/dL — ABNORMAL LOW (ref 8.9–10.3)
Chloride: 105 mmol/L (ref 101–111)
Creatinine, Ser: 0.8 mg/dL (ref 0.61–1.24)
Glucose, Bld: 77 mg/dL (ref 65–99)
Potassium: 4.4 mmol/L (ref 3.5–5.1)
Sodium: 141 mmol/L (ref 135–145)
Total Bilirubin: 0.5 mg/dL (ref 0.3–1.2)
Total Protein: 6.2 g/dL — ABNORMAL LOW (ref 6.5–8.1)

## 2015-05-19 LAB — CBC
HEMATOCRIT: 45.2 % (ref 39.0–52.0)
HEMOGLOBIN: 15 g/dL (ref 13.0–17.0)
MCH: 32.6 pg (ref 26.0–34.0)
MCHC: 33.2 g/dL (ref 30.0–36.0)
MCV: 98.3 fL (ref 78.0–100.0)
PLATELETS: 311 10*3/uL (ref 150–400)
RBC: 4.6 MIL/uL (ref 4.22–5.81)
RDW: 14.2 % (ref 11.5–15.5)
WBC: 9.8 10*3/uL (ref 4.0–10.5)

## 2015-05-19 LAB — TSH: TSH: 1.44 u[IU]/mL (ref 0.350–4.500)

## 2015-05-19 SURGERY — EGD (ESOPHAGOGASTRODUODENOSCOPY)
Anesthesia: Moderate Sedation

## 2015-05-19 MED ORDER — STERILE WATER FOR IRRIGATION IR SOLN
Status: DC | PRN
  Administered 2015-05-19: 12:00:00

## 2015-05-19 MED ORDER — MEPERIDINE HCL 100 MG/ML IJ SOLN
INTRAMUSCULAR | Status: AC
  Filled 2015-05-19: qty 2

## 2015-05-19 MED ORDER — PROMETHAZINE HCL 25 MG/ML IJ SOLN
25.0000 mg | Freq: Once | INTRAMUSCULAR | Status: AC
  Administered 2015-05-19: 25 mg via INTRAVENOUS
  Filled 2015-05-19: qty 1

## 2015-05-19 MED ORDER — SODIUM CHLORIDE 0.9 % IV SOLN
INTRAVENOUS | Status: DC
  Administered 2015-05-19: 1000 mL via INTRAVENOUS

## 2015-05-19 MED ORDER — MEPERIDINE HCL 100 MG/ML IJ SOLN
INTRAMUSCULAR | Status: DC | PRN
  Administered 2015-05-19 (×2): 50 mg via INTRAVENOUS

## 2015-05-19 MED ORDER — LIDOCAINE VISCOUS 2 % MT SOLN
OROMUCOSAL | Status: AC
  Filled 2015-05-19: qty 15

## 2015-05-19 MED ORDER — LIDOCAINE VISCOUS 2 % MT SOLN
OROMUCOSAL | Status: DC | PRN
  Administered 2015-05-19: 4 mL via OROMUCOSAL

## 2015-05-19 MED ORDER — MIDAZOLAM HCL 5 MG/5ML IJ SOLN
INTRAMUSCULAR | Status: AC
  Filled 2015-05-19: qty 10

## 2015-05-19 MED ORDER — MIDAZOLAM HCL 5 MG/5ML IJ SOLN
INTRAMUSCULAR | Status: DC | PRN
  Administered 2015-05-19: 1 mg via INTRAVENOUS
  Administered 2015-05-19: 2 mg via INTRAVENOUS
  Administered 2015-05-19: 1 mg via INTRAVENOUS
  Administered 2015-05-19: 2 mg via INTRAVENOUS
  Administered 2015-05-19: 1 mg via INTRAVENOUS

## 2015-05-19 NOTE — H&P (Signed)
Primary Care Physician:  [name], MD Primary Gastroenterologist:  Dr. Oneida Alar  Pre-Procedure History & Physical: HPI:  Garrett Klein is a 59 y.o. male here for DYSPHAGIA/DYSPEPSIA.  Past Medical History  Diagnosis Date  . COPD (chronic obstructive pulmonary disease)   . High cholesterol   . Chronic lower back pain   . MRSA infection   . PUD (peptic ulcer disease)   . Hypertension     Past Surgical History  Procedure Laterality Date  . Back surgery    . Joint replacement      left hip surgery - January 26, 2014  . Rotator cuff repair      November 2014  . Elbow bursa surgery    . Esophagogastroduodenoscopy N/A 10/28/2014    SLF: 1. Esophagitis due to GERD. KOH neg. 2. Small hiatal hernia 3. Moderate erosive gastritis 4. RUQ pain due to large ulcer 5. [place] duodentis in the bulb.   . Colonoscopy  2001    Dr. Sharlett Iles, normal  . Colonoscopy  ?2013    [facility] [place]: h/o polyps per patient.     Prior to Admission medications   Medication Sig Start Date End Date Taking? Authorizing Provider  albuterol (PROVENTIL HFA;VENTOLIN HFA) 108 (90 BASE) MCG/ACT inhaler Inhale 2 puffs into the lungs every 6 (six) hours as needed for wheezing or shortness of breath.   Yes Historical Provider, MD  albuterol (PROVENTIL) (2.5 MG/3ML) 0.083% nebulizer solution Take 2.5 mg by nebulization every 6 (six) hours as needed for wheezing or shortness of breath.   Yes Historical Provider, MD  ALPRAZolam [name]) 1 MG tablet Take 1 mg by mouth 2 (two) times daily. For anxiety   Yes Historical Provider, MD  atorvastatin (LIPITOR) 80 MG tablet Take 80 mg by mouth every evening.   Yes Historical Provider, MD  lidocaine (XYLOCAINE) 2 % solution 2 TSP  PO QAC AND HS TO PREVENT ABDOMINAL PAIN AFTER EATING as needed. 11/30/14  Yes [name], PA-C  lisinopril (PRINIVIL,ZESTRIL) 10 MG tablet Take 10 mg by mouth daily.   Yes Historical Provider, MD  omeprazole (PRILOSEC) 20 MG capsule Take 1 capsule (20 mg total)  by mouth 2 (two) times daily before a meal. 11/29/14  Yes [name], PA-C  cholecalciferol (VITAMIN D) 1000 UNITS tablet Take 1,000 Units by mouth 4 (four) times daily.     Historical Provider, MD  dicyclomine (BENTYL) 10 MG capsule 1-2 orally 30 minutes before meals and at bedtime as needed for diarrhea. No more than four times daily. Patient not taking: Reported on 05/08/2015 05/05/15   [name], PA-C  EPINEPHrine 0.3 mg/0.3 mL IJ SOAJ injection Inject 0.3 mg into the muscle as needed (allergic reaction).    Historical Provider, MD  gabapentin (NEURONTIN) 300 MG capsule Take 300 mg by mouth 3 (three) times daily.    Historical Provider, MD  meloxicam (MOBIC) 15 MG tablet Take 15 mg by mouth daily.    Historical Provider, MD  pantoprazole (PROTONIX) 40 MG tablet Take 1 tablet (40 mg total) by mouth 2 (two) times daily before a meal. Patient not taking: Reported on 05/08/2015 10/28/14   Danie Binder, MD  SYMBICORT 80-4.5 MCG/ACT inhaler Inhale 2 puffs into the lungs 2 (two) times daily.  04/24/15   Historical Provider, MD  venlafaxine XR (EFFEXOR-XR) 150 MG 24 hr capsule Take 150 mg by mouth daily with breakfast.    Historical Provider, MD    Allergies as of 05/05/2015 - Review Complete  05/05/2015  Allergen Reaction Noted  . Bee venom Swelling 06/21/2012  . Hydrocodone  03/28/2013  . Penicillins Other (See Comments) 12/19/2010  . Tramadol  10/18/2014  . Sulfa antibiotics Rash 03/28/2013    Family History  Problem Relation Age of Onset  . Diabetes Other   . Heart attack    . Cancer Mother   . Colon cancer Neg Hx   . Liver disease Neg Hx   . Ulcers Neg Hx   . Diabetes type II Father   . Heart attack Father   . Arthritis Sister   . Stroke Brother   . Coronary artery disease Brother     History   Social History  . Marital Status: Married    Spouse Name: N/A  . Number of Children: N/A  . Years of Education: N/A   Occupational History  . Not on file.   Social History  Main Topics  . Smoking status: Current Every Day Smoker -- 1.00 packs/day for 40 years    Types: Cigarettes  . Smokeless tobacco: Never Used  . Alcohol Use: No  . Drug Use: No  . Sexual Activity: Not on file   Other Topics Concern  . Not on file   Social History Narrative    Review of Systems: See HPI, otherwise negative ROS   Physical Exam: BP 93/65 mmHg  Pulse 72  Temp(Src) 98.3 F (36.8 C) (Oral)  Resp 20  Ht 5\' 10"  (1.778 m)  Wt 180 lb (81.647 kg)  BMI 25.83 kg/m2  SpO2 96% General:   Alert,  pleasant and cooperative in NAD Head:  Normocephalic and atraumatic. Neck:  Supple; Lungs:  Clear throughout to auscultation.    Heart:  Regular rate and rhythm. Abdomen:  Soft, nontender and nondistended. Normal bowel sounds, without guarding, and without rebound.   Neurologic:  Alert and  oriented x4;  grossly normal neurologically.  Impression/Plan:    DYSPHAGIA/DYSPEPSIA  PLAN:  EGD/DIL TODAY

## 2015-05-19 NOTE — Op Note (Signed)
[facility] [address] [place], [address]   ENDOSCOPY PROCEDURE REPORT  PATIENT: Garrett Klein, Garrett Klein  MR#: [id number] BIRTHDATE: [birth year] , 9  yrs. old GENDER: male  ENDOSCOPIST: Danie Binder, MD REFFERED DE:YCXKGY Manuella Ghazi, M.D. PROCEDURE DATE:  05/24/15 PROCEDURE:   EGD with dilatation over guidewire  INDICATIONS:1.  dyspepsia.   2.  dysphagia. STOPPED ALL HIS MEDS BECAUSE HE FELT BAD. MEDICATIONS: Demerol 100 mg IV, Versed 8 mg IV, and Promethazine (Phenergan) 25 mg IV TOPICAL ANESTHETIC: Viscous Xylocaine  DESCRIPTION OF PROCEDURE:   After the risks benefits and alternatives of the procedure were thoroughly explained, informed consent was obtained.  The EG-[id number] ([id number])  endoscope was introduced through the mouth and advanced to the second portion of the duodenum. The instrument was slowly withdrawn as the mucosa was carefully examined.  Prior to withdrawal of the scope, the guidwire was placed.  The esophagus was dilated successfully.  The patient was recovered in endoscopy and discharged home in satisfactory condition. Estimated blood loss is zero unless otherwise noted in this procedure report.   ESOPHAGUS: FEW LINEAR EROSIONS IN DISTAL ESOPHAGUS.   A stricture was found at the gastroesophageal junction.  The stenosis was traversable with the endoscope.   STOMACH: Mild TO MODERATE non-erosive gastritis (inflammation) was found in the gastric body and gastric antrum.   PSEUDO-PYLORUS.   DUODENUM: The duodenal mucosa showed no abnormalities in the bulb and second portion of the duodenum.   Dilation was then performed at the gastroesphageal junction  Dilator: Savary over guidewire Size(s): 12.8-16 MM Resistance: minimal Heme: none  COMPLICATIONS: There were no immediate complications.  ENDOSCOPIC IMPRESSION: 1.   MILD  DISTAL ESOPHAGITIS 2.   PEPTIC Stricture  at the gastroesophageal junction 3.   MILD Non-erosive gastritis 4.   PSEUDO-PYLORUS  DUE TO PRIOR PUD  RECOMMENDATIONS: CHECK TSH, CMP, AND CBC TODAY. OMEPRAZOLE 30 MINS PRIOR TO MEALS PO BID FOREVER. RE-START MEDS ON THIS DISCHARGE SUMMARY. CALL IN ONE MONTH IF SYMPTOMS ARE NOT IMPROVED. FOLLOW UP IN 3 MOS.   eSigned:  Danie Binder, MD 05-24-2015 12:56 PM   CPT CODES: ICD CODES:  The ICD and CPT codes recommended by this software are interpretations from the data that the clinical staff has captured with the software.  The verification of the translation of this report to the ICD and CPT codes and modifiers is the sole responsibility of the health care institution and practicing physician where this report was generated.  [employer] will not be held responsible for the validity of the ICD and CPT codes included on this report.  AMA assumes no liability for data contained or not contained herein. CPT is a Designer, television/film set of the [employer].

## 2015-05-19 NOTE — Discharge Instructions (Signed)
YOUR STOMACH ULCER IS HEALED. I STRETCHED your esophagus. You have a stricture near the base of your esophagus.  You have gastritis DUE TO [place] PAIN IS DUE TO REFLUX, ESOPHAGITIS, THE STRICTURE, AND GASTRITIS.   I WILL CHECK YOUR THYROID, LIVER ENZYMES, AND BLOOD COUNT TODAY.  CONTINUE OMEPRAZOLE.  TAKE 30 MINUTES PRIOR TO YOUR MEALS TWICE DAILY FOREVER.  RE-START MEDS ON THIS DISCHARGE SUMMARY.   PLEASE CALL IN ONE MONTH IF SYMPTOMS ARE NOT IMPROVED.    FOLLOW UP IN 3 MOS.   UPPER ENDOSCOPY AFTER CARE Read the instructions outlined below and refer to this sheet in the next week. These discharge instructions provide you with general information on caring for yourself after you leave the hospital. While your treatment has been planned according to the most current medical practices available, unavoidable complications occasionally occur. If you have any problems or questions after discharge, call DR. Demarus Latterell, [phone].  ACTIVITY  You may resume your regular activity, but move at a slower pace for the next 24 hours.   Take frequent rest periods for the next 24 hours.   Walking will help get rid of the air and reduce the bloated feeling in your belly (abdomen).   No driving for 24 hours (because of the medicine (anesthesia) used during the test).   You may shower.   Do not sign any important legal documents or operate any machinery for 24 hours (because of the anesthesia used during the test).    NUTRITION  Drink plenty of fluids.   You may resume your normal diet as instructed by your doctor.   Begin with a light meal and progress to your normal diet. Heavy or fried foods are harder to digest and may make you feel sick to your stomach (nauseated).   Avoid alcoholic beverages for 24 hours or as instructed.    MEDICATIONS  You may resume your normal medications.   WHAT YOU CAN EXPECT TODAY  Some feelings of  bloating in the abdomen.   Passage of more gas than usual.    IF YOU HAD A BIOPSY TAKEN DURING THE UPPER ENDOSCOPY:  Eat a soft diet IF YOU HAVE NAUSEA, BLOATING, ABDOMINAL PAIN, OR VOMITING.    FINDING OUT THE RESULTS OF YOUR TEST Not all test results are available during your visit. DR. Oneida Alar WILL CALL YOU WITHIN 14 DAYS OF YOUR PROCEDUE WITH YOUR RESULTS. Do not assume everything is normal if you have not heard from DR. Ledia Hanford, CALL HER OFFICE AT [phone].  SEEK IMMEDIATE MEDICAL ATTENTION AND CALL THE OFFICE: [phone] IF:  You have more than a spotting of blood in your stool.   Your belly is swollen (abdominal distention).   You are nauseated or vomiting.   You have a temperature over 101F.   You have abdominal pain or discomfort that is severe or gets worse throughout the day.  Gastritis  Gastritis is an inflammation (the body's way of reacting to injury and/or infection) of the stomach. It is often caused by viral or bacterial (germ) infections. It can also be caused BY ASPIRIN, BC/GOODY POWDER'S, (IBUPROFEN) MOTRIN, OR ALEVE (NAPROXEN), chemicals (including alcohol), SPICY FOODS, and medications. This illness may be associated with generalized malaise (feeling tired, not well), UPPER ABDOMINAL STOMACH cramps, and fever. One common bacterial cause of gastritis is an organism known as H. Pylori. This can be treated with antibiotics.   ESOPHAGEAL STRICTURE  Esophageal strictures can be caused by  stomach acid backing up into the tube that carries food from the mouth down to the stomach (lower esophagus).  TREATMENT There are a number of medicines used to treat reflux/stricture, including: Antacids.  Proton-pump inhibitors: OMEPRAZOLE  HOME CARE INSTRUCTIONS Eat 2-3 hours before going to bed.  Try to reach and maintain a healthy weight.  Do not eat just a few very large meals. Instead, eat 4 TO 6 smaller meals throughout the day.  Try to identify foods and  beverages that make your symptoms worse, and avoid these.  Avoid tight clothing.  Do not exercise right after eating.

## 2015-05-19 NOTE — Progress Notes (Signed)
REVIEWED-NO ADDITIONAL RECOMMENDATIONS. 

## 2015-05-23 ENCOUNTER — Encounter (HOSPITAL_COMMUNITY): Payer: Self-pay | Admitting: Gastroenterology

## 2015-05-30 ENCOUNTER — Telehealth: Payer: Self-pay

## 2015-05-30 NOTE — Telephone Encounter (Signed)
PLEASE CALL PT. HIS BLOOD COUNT, LIVER & THYROID TESTS, AND KIDNEY FUNCTION ARE NORMAL.

## 2015-05-30 NOTE — Telephone Encounter (Signed)
Patients wife called and states that the pt is wondering what his procedure and labs showed. Wife states that pt is sleeping a lot and is scared to eat because he doesn't want to be in pain. Please advise

## 2015-05-31 NOTE — Telephone Encounter (Signed)
Called and spoke with pt wife and she is aware of results. Told her to encourage pt to push fluids.

## 2015-06-01 ENCOUNTER — Telehealth: Payer: Self-pay | Admitting: Gastroenterology

## 2015-06-01 NOTE — Telephone Encounter (Signed)
Pt's wife called saying someone from our office had called her cellphone and didn't know what it was about and didn't leave a message. I told her that I wasn't sure who had tried calling if they didn't leave a message and everyone was with patients at the moment. I told her that I would have whomever called call back. She asked for [place] to call the home number [phone] instead of her cellphone number. I told her that I would pass that along and change home number to the primary number if they need to call. She agreed.

## 2015-06-01 NOTE — Telephone Encounter (Signed)
Spoke with pt yesterday.

## 2015-06-02 NOTE — Telephone Encounter (Signed)
Tried calling pt  No answer °

## 2015-07-31 DIAGNOSIS — I712 Thoracic aortic aneurysm, without rupture, unspecified: Secondary | ICD-10-CM | POA: Insufficient documentation

## 2015-07-31 DIAGNOSIS — R9431 Abnormal electrocardiogram [ECG] [EKG]: Secondary | ICD-10-CM | POA: Insufficient documentation

## 2015-08-09 ENCOUNTER — Encounter: Payer: Self-pay | Admitting: Gastroenterology

## 2015-08-18 ENCOUNTER — Telehealth: Payer: Self-pay | Admitting: Gastroenterology

## 2015-08-18 DIAGNOSIS — R197 Diarrhea, unspecified: Secondary | ICD-10-CM

## 2015-08-18 LAB — CBC WITH DIFFERENTIAL/PLATELET
Basophils Absolute: 0.2 10*3/uL — ABNORMAL HIGH (ref 0.0–0.1)
Basophils Relative: 2 % — ABNORMAL HIGH (ref 0–1)
EOS ABS: 0.4 10*3/uL (ref 0.0–0.7)
Eosinophils Relative: 5 % (ref 0–5)
HEMATOCRIT: 40.7 % (ref 39.0–52.0)
Hemoglobin: 13.8 g/dL (ref 13.0–17.0)
LYMPHS ABS: 2 10*3/uL (ref 0.7–4.0)
LYMPHS PCT: 25 % (ref 12–46)
MCH: 32.1 pg (ref 26.0–34.0)
MCHC: 33.9 g/dL (ref 30.0–36.0)
MCV: 94.7 fL (ref 78.0–100.0)
MONO ABS: 0.6 10*3/uL (ref 0.1–1.0)
MPV: 9.9 fL (ref 8.6–12.4)
Monocytes Relative: 7 % (ref 3–12)
Neutro Abs: 4.9 10*3/uL (ref 1.7–7.7)
Neutrophils Relative %: 61 % (ref 43–77)
PLATELETS: 331 10*3/uL (ref 150–400)
RBC: 4.3 MIL/uL (ref 4.22–5.81)
RDW: 14.8 % (ref 11.5–15.5)
WBC: 8 10*3/uL (ref 4.0–10.5)

## 2015-08-18 LAB — BASIC METABOLIC PANEL
BUN: 6 mg/dL — AB (ref 7–25)
CALCIUM: 9.1 mg/dL (ref 8.6–10.3)
CO2: 24 mmol/L (ref 20–31)
Chloride: 106 mmol/L (ref 98–110)
Creat: 0.8 mg/dL (ref 0.70–1.25)
Glucose, Bld: 117 mg/dL — ABNORMAL HIGH (ref 65–99)
Potassium: 4.5 mmol/L (ref 3.5–5.3)
Sodium: 139 mmol/L (ref 135–146)

## 2015-08-18 MED ORDER — METRONIDAZOLE 500 MG PO TABS: 500.0000 mg | ORAL_TABLET | Freq: Three times a day (TID) | ORAL | Status: DC

## 2015-08-18 NOTE — Telephone Encounter (Signed)
Lab orders faxed to [facility] and pt is aware he needs to get stool containers from there.

## 2015-08-18 NOTE — Telephone Encounter (Signed)
Needs to collect stool ASAP for culture, c.diff, o+P. Orders entered.  Needs labs to check electrolytes, tsh, etc. Orders entered.  1. Once collect stools, start flagyl 500mg  tid with food for 10 days.  2. Where is his pain? 3. Drink plenty of fluids to keep urine light yellow. 4. OV next week.

## 2015-08-18 NOTE — Telephone Encounter (Signed)
I called pt and he said he has warts in his rectal area, kindly below the rectum. He was first diagnosed at age 59 and had some removed. He has them now and said it is kind of like skin tags, but the pain is bad and he is having diarrhea for the last week and a half to two weeks. Michela Pitcher he has about 20 -50 episodes a day. He is requesting something for pain and something for the diarrhea and advise on what to do about the warts. Please advise!

## 2015-08-18 NOTE — Addendum Note (Signed)
Addended by: [name] on: 08/18/2015 11:35 AM   Modules accepted: Orders

## 2015-08-18 NOTE — Telephone Encounter (Signed)
Pt is aware. His pain is just in his rectal area at the warts. He is aware to do labs and stool samples ASAP.  He knows not to start Flagyl until he does the stool specimens. Has OV appt with Neil Crouch, PA on 08/23/2015 at 11:00 AM.

## 2015-08-18 NOTE — Telephone Encounter (Signed)
[place] HAVING A PROBLEM WITH HIS ANAL REGION  [phone]

## 2015-08-19 LAB — TSH: TSH: 0.829 u[IU]/mL (ref 0.350–4.500)

## 2015-08-21 ENCOUNTER — Other Ambulatory Visit: Payer: Self-pay | Admitting: Gastroenterology

## 2015-08-21 NOTE — Telephone Encounter (Signed)
Quick Note:  Please let patient know his labs looked fine. Electrolytes normal and no signs of dehydration.  Await stool test. ______

## 2015-08-21 NOTE — Telephone Encounter (Signed)
Quick Note:  LMOM to call. ______ 

## 2015-08-21 NOTE — Telephone Encounter (Signed)
Quick Note:  Pt's wife called and is aware of results. ______

## 2015-08-22 LAB — GIARDIA/CRYPTOSPORIDIUM (EIA)
CRYPTOSPORIDIUM SCREEN (EIA) (SOL): NEGATIVE
Giardia Screen (EIA): NEGATIVE

## 2015-08-22 LAB — CLOSTRIDIUM DIFFICILE BY PCR: Toxigenic C. Difficile by PCR: NOT DETECTED

## 2015-08-23 ENCOUNTER — Encounter: Payer: Self-pay | Admitting: Gastroenterology

## 2015-08-23 ENCOUNTER — Ambulatory Visit (INDEPENDENT_AMBULATORY_CARE_PROVIDER_SITE_OTHER): Payer: Medicare Other | Admitting: Gastroenterology

## 2015-08-23 VITALS — BP 113/74 | HR 83 | Temp 97.1°F | Ht 70.0 in | Wt 187.6 lb

## 2015-08-23 DIAGNOSIS — K529 Noninfective gastroenteritis and colitis, unspecified: Secondary | ICD-10-CM | POA: Diagnosis not present

## 2015-08-23 DIAGNOSIS — K644 Residual hemorrhoidal skin tags: Secondary | ICD-10-CM

## 2015-08-23 MED ORDER — HYDROCORTISONE 2.5 % RE CREA: 1.0000 "application " | TOPICAL_CREAM | Freq: Two times a day (BID) | RECTAL | Status: DC

## 2015-08-23 MED ORDER — RAMELTEON 8 MG PO TABS: 8.0000 mg | ORAL_TABLET | Freq: Every day | ORAL | Status: DC

## 2015-08-23 NOTE — Assessment & Plan Note (Signed)
Reviewed records from prior colonoscopy in 2014. Patient diagnosed with collagenous colitis at that time and treated with OTC antidiarrheal medication. Suspect flare of collagenous colitis. Await final stool culture report and complete flagyl as he seems to have some improvement over the last two days. Based on findings and symptoms, he may require Uceris or Entocort as next option. Progress report in two weeks.   One time RX for Rozerem to help with insomnia.  Continue Bentyl up to four times per day.  Bland, low residue diet for now.  Discuss tick exposure concerns with PCP.

## 2015-08-23 NOTE — Progress Notes (Signed)
ON RECALL  °

## 2015-08-23 NOTE — Progress Notes (Signed)
Please NIC for next TCS to be in 04/2018.

## 2015-08-23 NOTE — Progress Notes (Signed)
Quick Note:  Stools negative so far. Await culture. Keep ov as scheduled. ______

## 2015-08-23 NOTE — Patient Instructions (Addendum)
1. I suspect your diarrhea is related to a flare of your microscopic colitis. If your diarrhea continues after treatment with Flagyl, we would offer you a different medication for inflammation. 2. You may continue Bentyl for now for diarrhea.  3. Please apply cream to anal area twice daily for two weeks. If this area continues to bother you, we could consider surgical referral to see what your options are.  4. One time RX to help you with sleep. Any future RX for sleep needs to come from your PCP. 5. Please address tick exposure concerns with your PCP.  6. Call with a progress report in two weeks.

## 2015-08-23 NOTE — Assessment & Plan Note (Signed)
Suspect anal skin tag related to hemorrhoid with notable excoriation. Treat with topical therapy. If ongoing problems, consider surgical referral for definitive treatment options.

## 2015-08-23 NOTE — Progress Notes (Signed)
CC'D TO PCP °

## 2015-08-23 NOTE — Progress Notes (Signed)
Primary Care Physician: [name], MD  Primary Gastroenterologist:  Barney Drain, MD   Chief Complaint  Patient presents with  . Diarrhea    HPI: Garrett Klein is a 59 y.o. male here for semiurgent office visit used diarrhea and complaints of anal warts. Patient has a history of peptic ulcer disease, erosive reflux esophagitis. Seen back in June 2016. Started Bentyl for chronic postprandial diarrhea. Celiac serologies were negative.  December 2015, EGD with large ulcer beginning in the pylorus and extending into the duodenum in the setting of Mobic and Goody powders use/no PPI. Distal esophageal stricture. Mild duodenitis and gastritis. 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   Patient called last week complaining of 20-50 episodes of diarrhea daily for couple of weeks. Also complaining of anal warts. States he had anal warts removed age 53. C. difficile PCR negative. Giardia/Cryptosporidium negative. Stool culture currently pending. CBC, electrolytes and TSH unremarkable. Started Flagyl on Monday. Complains of nocturnal bowel movements. Anal rectal irritation from wiping. Mild bright red blood on the toilet tissue. States his stools have been very dark for the past few weeks. Liquid consistency. Denies Pepto or Imodium. Has been using Bentyl peroxide twice daily. Appetite is poor. He is hydrating sufficiently, urine light yellow in color. Some associated abdominal cramping improved with bowel movement. No vomiting. Reports upper GI symptoms resolved.  Over the last couple of days number of stools has improved. Had 30 yesterday, for so far today.  Multiple ticks pulled off several weeks ago. No rash or fever.   Reports insomnia related to diarrhea. Request medication.     Current Outpatient Prescriptions  Medication Sig Dispense Refill  .  albuterol (PROVENTIL HFA;VENTOLIN HFA) 108 (90 BASE) MCG/ACT inhaler Inhale 2 puffs into the lungs every 6 (six) hours as needed for wheezing or shortness of breath.    Marland Kitchen albuterol (PROVENTIL) (2.5 MG/3ML) 0.083% nebulizer solution Take 2.5 mg by nebulization every 6 (six) hours as needed for wheezing or shortness of breath.    . ALPRAZolam (XANAX) 1 MG tablet Take 1 mg by mouth 2 (two) times daily. For anxiety    . atorvastatin (LIPITOR) 80 MG tablet Take 80 mg by mouth every evening.    . cholecalciferol (VITAMIN D) 1000 UNITS tablet Take 1,000 Units by mouth 4 (four) times daily.     Marland Kitchen EPINEPHrine 0.3 mg/0.3 mL IJ SOAJ injection Inject 0.3 mg into the muscle as needed (allergic reaction).    Marland Kitchen lidocaine (XYLOCAINE) 2 % solution 2 TSP  PO QAC AND HS TO PREVENT ABDOMINAL PAIN AFTER EATING as needed. 200 mL 3  . lisinopril (PRINIVIL,ZESTRIL) 10 MG tablet Take 10 mg by mouth daily.    . meloxicam (MOBIC) 15 MG tablet Take 15 mg by mouth daily.    . metroNIDAZOLE (FLAGYL) 500 MG tablet Take 1 tablet (500 mg total) by mouth 3 (three) times daily. 30 tablet 0  . omeprazole (PRILOSEC) 20 MG capsule Take 1 capsule (20 mg total) by mouth 2 (two) times daily before a meal. 60 capsule 3  . SYMBICORT 80-4.5 MCG/ACT inhaler Inhale 2 puffs into the lungs 2 (two) times daily.     Marland Kitchen venlafaxine XR (EFFEXOR-XR) 150 MG 24 hr capsule Take 150 mg by mouth daily with breakfast.     No current facility-administered medications for this visit.    Allergies as of 08/23/2015 -  Review Complete 08/23/2015  Allergen Reaction Noted  . Bee venom Swelling 06/21/2012  . Hydrocodone  03/28/2013  . Penicillins Other (See Comments) 12/19/2010  . Tramadol  10/18/2014  . Sulfa antibiotics Rash 03/28/2013    ROS:  General: Negative for anorexia, weight loss, fever, chills, fatigue, weakness. ENT: Negative for hoarseness, difficulty swallowing , nasal congestion. CV: Negative for chest pain, angina, palpitations, dyspnea  on exertion, peripheral edema.  Respiratory: Negative for dyspnea at rest, dyspnea on exertion, cough, sputum, wheezing.  GI: See history of present illness. GU:  Negative for dysuria, hematuria, urinary incontinence, urinary frequency, nocturnal urination.  Endo: Negative for unusual weight change.    Physical Examination:   BP 113/74 mmHg  Pulse 83  Temp(Src) 97.1 F (36.2 C) (Oral)  Ht 5\' 10"  (1.778 m)  Wt 187 lb 9.6 oz (85.095 kg)  BMI 26.92 kg/m2  General: Well-nourished, well-developed in no acute distress.  Eyes: No icterus. Mouth: Oropharyngeal mucosa moist and pink , no lesions erythema or exudate. Lungs: Clear to auscultation bilaterally.  Heart: Regular rate and rhythm, no murmurs rubs or gallops.  Abdomen: Bowel sounds are normal, nontender, nondistended, no hepatosplenomegaly or masses, no abdominal bruits or hernia , no rebound or guarding.   Rectal: no anal warts seen. Large skin tag with excoriation noted at [facility]. No anorectal prolapse. No masses in rectal vault. Internal exam nontender. No blood on glove.  Extremities: No lower extremity edema. No clubbing or deformities. Neuro: Alert and oriented x 4   Skin: Warm and dry, no jaundice.   Psych: Alert and cooperative, normal mood and affect.  Labs:  Lab Results  Component Value Date   WBC 8.0 08/18/2015   HGB 13.8 08/18/2015   HCT 40.7 08/18/2015   MCV 94.7 08/18/2015   PLT 331 08/18/2015   Lab Results  Component Value Date   CREATININE 0.80 08/18/2015   BUN 6* 08/18/2015   NA 139 08/18/2015   K 4.5 08/18/2015   CL 106 08/18/2015   CO2 24 08/18/2015   Lab Results  Component Value Date   TSH 0.829 08/18/2015     Imaging Studies: No results found.

## 2015-08-23 NOTE — Progress Notes (Signed)
Quick Note:  Pt was in the office today. ______

## 2015-08-24 NOTE — Progress Notes (Signed)
REVIEWED-NO ADDITIONAL RECOMMENDATIONS. 

## 2015-08-25 ENCOUNTER — Other Ambulatory Visit: Payer: Self-pay | Admitting: Gastroenterology

## 2015-08-25 LAB — STOOL CULTURE

## 2015-08-25 MED ORDER — BUDESONIDE 9 MG PO TB24: 9.0000 mg | ORAL_TABLET | Freq: Every day | ORAL | Status: DC

## 2015-08-25 NOTE — Progress Notes (Signed)
Quick Note:  Please let patient know that final stool culture was negative.  Complete Flagyl. If diarrhea persistent, then take Uceris 9mg  daily for 8 weeks. RX sent to pharmacy. Call if not covered.  Return OV in six weeks with [facility]. ______

## 2015-08-29 ENCOUNTER — Encounter: Payer: Self-pay | Admitting: Gastroenterology

## 2015-08-29 ENCOUNTER — Telehealth: Payer: Self-pay | Admitting: Gastroenterology

## 2015-08-29 NOTE — Telephone Encounter (Signed)
Patient's wife called yesterday and again this morning. She is wanting to know why SF wrote him a rx and what it is for and also checking about his stool results. Please advise. [phone]

## 2015-08-29 NOTE — Progress Notes (Signed)
Quick Note:  Pt's wife has been informed. Pt is still having some diarrhea and has already had 4 diarrhea stools this AM. They will check on the prescription and let us know if it is not covered. ______

## 2015-08-29 NOTE — Telephone Encounter (Signed)
See stool culture results note dated 08/21/2015, I have spoke to pt's wife.

## 2015-08-29 NOTE — Progress Notes (Signed)
Quick Note:  Routing to [employer] for the OV with Dr. Oneida Alar in 6 weeks. ______

## 2015-08-29 NOTE — Progress Notes (Signed)
APPT MADE AND LETTER SENT  °

## 2015-08-30 ENCOUNTER — Ambulatory Visit: Payer: Medicare Other | Admitting: Gastroenterology

## 2015-08-31 ENCOUNTER — Ambulatory Visit: Payer: Medicare Other | Admitting: Gastroenterology

## 2015-09-19 ENCOUNTER — Telehealth: Payer: Self-pay

## 2015-09-19 NOTE — Telephone Encounter (Signed)
Pt's wife, Constance Holster, called and said that pt had a reaction to some steroids that was given for his back 8 days ago. She wanted to know what the name of the medication was that was recently given from our office to pt and I told her [place]. She wanted to know if it was a steroid, and I told her yes. She said pt took it about 2 and a half weeks and stopped because his diarrhea was better. He is gong to see PCP this AM and she wanted to know the name of the medicine, said he couldn't find the bottle. He is having a lot of trouble with his mouth sore now.  Forwarding to Neil Crouch, PA who last seen pt here.

## 2015-09-19 NOTE — Telephone Encounter (Signed)
Noted. Garrett Klein is a type of steroid but likely different than what was given for his back. If diarrhea returns we may need to treat longer than the original two weeks that he took it.   Can see PCP about his mouth sore. Let us know if any further questions after visit with PCP.

## 2015-09-20 NOTE — Telephone Encounter (Signed)
I spoke to pt's wife. He is being treated for yeast infection in his mouth with the Swish and Swallow medication. He is to follow up with PCP if that doesn't help.  They will call if he has problems with diarrhea.

## 2015-09-20 NOTE — Telephone Encounter (Signed)
LMOM to call.

## 2015-09-20 NOTE — Telephone Encounter (Signed)
Noted  

## 2015-10-04 ENCOUNTER — Ambulatory Visit: Payer: Medicare Other | Admitting: Gastroenterology

## 2016-01-19 ENCOUNTER — Other Ambulatory Visit: Payer: Self-pay | Admitting: Gastroenterology

## 2016-04-19 ENCOUNTER — Telehealth: Payer: Self-pay | Admitting: Hematology & Oncology

## 2016-04-19 NOTE — Telephone Encounter (Signed)
Lt mess for [facility] regarding forwarding a hem referral to them, informed that the wife is anxious regarding the appointment.

## 2016-05-02 ENCOUNTER — Ambulatory Visit: Payer: Medicare Other | Admitting: Oncology

## 2016-05-06 ENCOUNTER — Encounter (HOSPITAL_COMMUNITY): Payer: Medicare Other | Attending: Hematology | Admitting: Hematology

## 2016-05-06 ENCOUNTER — Encounter (HOSPITAL_COMMUNITY): Payer: Self-pay

## 2016-05-06 ENCOUNTER — Encounter (HOSPITAL_COMMUNITY): Payer: Medicare Other

## 2016-05-06 VITALS — BP 121/75 | HR 82 | Temp 98.5°F | Resp 18 | Ht 70.0 in | Wt 190.9 lb

## 2016-05-06 DIAGNOSIS — Z880 Allergy status to penicillin: Secondary | ICD-10-CM | POA: Insufficient documentation

## 2016-05-06 DIAGNOSIS — Z9889 Other specified postprocedural states: Secondary | ICD-10-CM | POA: Insufficient documentation

## 2016-05-06 DIAGNOSIS — N481 Balanitis: Secondary | ICD-10-CM | POA: Insufficient documentation

## 2016-05-06 DIAGNOSIS — Z809 Family history of malignant neoplasm, unspecified: Secondary | ICD-10-CM | POA: Diagnosis not present

## 2016-05-06 DIAGNOSIS — I10 Essential (primary) hypertension: Secondary | ICD-10-CM | POA: Diagnosis not present

## 2016-05-06 DIAGNOSIS — F17210 Nicotine dependence, cigarettes, uncomplicated: Secondary | ICD-10-CM | POA: Diagnosis not present

## 2016-05-06 DIAGNOSIS — E7800 Pure hypercholesterolemia, unspecified: Secondary | ICD-10-CM | POA: Insufficient documentation

## 2016-05-06 DIAGNOSIS — J449 Chronic obstructive pulmonary disease, unspecified: Secondary | ICD-10-CM | POA: Diagnosis not present

## 2016-05-06 DIAGNOSIS — K279 Peptic ulcer, site unspecified, unspecified as acute or chronic, without hemorrhage or perforation: Secondary | ICD-10-CM | POA: Insufficient documentation

## 2016-05-06 DIAGNOSIS — R300 Dysuria: Secondary | ICD-10-CM | POA: Diagnosis not present

## 2016-05-06 DIAGNOSIS — D72829 Elevated white blood cell count, unspecified: Secondary | ICD-10-CM

## 2016-05-06 DIAGNOSIS — Z720 Tobacco use: Secondary | ICD-10-CM | POA: Diagnosis not present

## 2016-05-06 DIAGNOSIS — Z888 Allergy status to other drugs, medicaments and biological substances status: Secondary | ICD-10-CM | POA: Insufficient documentation

## 2016-05-06 DIAGNOSIS — J441 Chronic obstructive pulmonary disease with (acute) exacerbation: Secondary | ICD-10-CM | POA: Insufficient documentation

## 2016-05-06 DIAGNOSIS — G8929 Other chronic pain: Secondary | ICD-10-CM | POA: Insufficient documentation

## 2016-05-06 DIAGNOSIS — Z79899 Other long term (current) drug therapy: Secondary | ICD-10-CM | POA: Insufficient documentation

## 2016-05-06 LAB — COMPREHENSIVE METABOLIC PANEL
ALBUMIN: 4 g/dL (ref 3.5–5.0)
ALT: 12 U/L — AB (ref 17–63)
ANION GAP: 6 (ref 5–15)
AST: 16 U/L (ref 15–41)
Alkaline Phosphatase: 70 U/L (ref 38–126)
BUN: 11 mg/dL (ref 6–20)
CALCIUM: 8.9 mg/dL (ref 8.9–10.3)
CHLORIDE: 103 mmol/L (ref 101–111)
CO2: 27 mmol/L (ref 22–32)
CREATININE: 0.91 mg/dL (ref 0.61–1.24)
GFR calc non Af Amer: >60 mL/min (ref >60)
GLUCOSE: 129 mg/dL — AB (ref 65–99)
Potassium: 4 mmol/L (ref 3.5–5.1)
SODIUM: 136 mmol/L (ref 135–145)
Total Bilirubin: 0.4 mg/dL (ref 0.3–1.2)
Total Protein: 6.8 g/dL (ref 6.5–8.1)

## 2016-05-06 LAB — CBC WITH DIFFERENTIAL/PLATELET
Basophils Absolute: 0.1 10*3/uL (ref 0.0–0.1)
Basophils Relative: 1 %
EOS PCT: 2 %
Eosinophils Absolute: 0.2 10*3/uL (ref 0.0–0.7)
HCT: 43.3 % (ref 39.0–52.0)
HEMOGLOBIN: 14.4 g/dL (ref 13.0–17.0)
LYMPHS ABS: 1.8 10*3/uL (ref 0.7–4.0)
LYMPHS PCT: 20 %
MCH: 31.7 pg (ref 26.0–34.0)
MCHC: 33.3 g/dL (ref 30.0–36.0)
MCV: 95.4 fL (ref 78.0–100.0)
MONOS PCT: 8 %
Monocytes Absolute: 0.7 10*3/uL (ref 0.1–1.0)
Neutro Abs: 6.1 10*3/uL (ref 1.7–7.7)
Neutrophils Relative %: 69 %
PLATELETS: 338 10*3/uL (ref 150–400)
RBC: 4.54 MIL/uL (ref 4.22–5.81)
RDW: 15 % (ref 11.5–15.5)
WBC: 8.9 10*3/uL (ref 4.0–10.5)

## 2016-05-06 LAB — RETICULOCYTES
RBC.: 4.54 MIL/uL (ref 4.22–5.81)
Retic Count, Absolute: 81.7 10*3/uL (ref 19.0–186.0)
Retic Ct Pct: 1.8 % (ref 0.4–3.1)

## 2016-05-06 LAB — SEDIMENTATION RATE: Sed Rate: 10 mm/hr (ref 0–16)

## 2016-05-06 LAB — LACTATE DEHYDROGENASE: LDH: 186 U/L (ref 98–192)

## 2016-05-06 NOTE — Patient Instructions (Signed)
[place] at [facility] Discharge Instructions  RECOMMENDATIONS MADE BY THE CONSULTANT AND ANY TEST RESULTS WILL BE SENT TO YOUR REFERRING PHYSICIAN.  Labs today. Return to center to see Dr. Whitney Muse in 3 months. Call the center for any concerns.  Thank you for choosing [place] at [facility] to provide your oncology and hematology care.  To afford each patient quality time with our provider, please arrive at least 15 minutes before your scheduled appointment time.   Beginning January 23rd 2017 lab work for the [facility] will be done in the  Main lab at [facility] on 1st floor. If you have a lab appointment with the [place] please come in thru the  Main Entrance and check in at the main information desk  You need to re-schedule your appointment should you arrive 10 or more minutes late.  We strive to give you quality time with our providers, and arriving late affects you and other patients whose appointments are after yours.  Also, if you no show three or more times for appointments you may be dismissed from the clinic at the providers discretion.     Again, thank you for choosing [facility].  Our hope is that these requests will decrease the amount of time that you wait before being seen by our physicians.       _____________________________________________________________  Should you have questions after your visit to [facility], please contact our office at [phone] between the hours of 8:30 a.m. and 4:30 p.m.  Voicemails left after 4:30 p.m. will not be returned until the following business day.  For prescription refill requests, have your pharmacy contact our office.         Resources For Cancer Patients and their Caregivers ? American Cancer Society: Can assist with transportation, wigs, general needs, runs Look Good Feel Better.        [phone] ? Cancer Care: Provides financial  assistance, online support groups, medication/co-pay assistance.  1-800-813-HOPE 640-058-9747) ? [place] Assists [place] cancer patients and their families through emotional , educational and financial support.  731-255-4628 ? [place] DSS Where to apply for food stamps, Medicaid and utility assistance. 539-842-2576 ? [facility]: Transportation to medical appointments. 703-317-5030 ? Social Security Administration: May apply for disability if have a Stage IV cancer. 630-481-1277 (218)639-6135 ? [employer], Disability and Transit Services: Assists with nutrition, care and transit needs. [place] Support Programs: @10RELATIVEDAYS @ > Cancer Support Group  2nd Tuesday of the month 1pm-2pm, Journey Room  > Creative Journey  3rd Tuesday of the month 1130am-1pm, Journey Room  > Look Good Feel Better  1st Wednesday of the month 10am-12 noon, Journey Room (Call [place] to register [phone])

## 2016-05-07 LAB — PATHOLOGIST SMEAR REVIEW

## 2016-05-13 NOTE — Progress Notes (Signed)
Marland Kitchen    HEMATOLOGY/ONCOLOGY CONSULTATION NOTE  Date of Service: 05/06/2016  Patient Care Team: [name], MD as PCP - General (Internal Medicine) Danie Binder, MD as Consulting Physician (Gastroenterology)  CHIEF COMPLAINTS/PURPOSE OF CONSULTATION:  Leukocytosis  HISTORY OF PRESENTING ILLNESS:  Garrett Klein is a wonderful 60 y.o. male who has been referred to [place] by Dr [name], MD  for evaluation and management of leukocytosis.  Patient is 60 year old male with a history of COPD, active smoking 1 pack per day, hypertension, peptic ulcer disease, chronic low back pain, dyslipidemia, allergies: Notes that he has recurrence to be exacerbations and has been to several antibiotics in the last few months.  He had labs with his primary care physician on 03/29/2016 that showed a hemoglobin level of 13.8 WBC count of 10.3k and platelet count of 323k. Subsequent labs with his primary care physician on 04/09/2016 showed a hemoglobin of 14.6, WBC count was elevated at 15.4 K a neutrophil count of 13.7k, platelets of 366k. Patient reports that he had a steroid shot in his buttocks in the early part of May for nasal allergies/cough. He reports he had dysuria and balanitis in January 2017. Currently he has no fevers no chills no night sweats no enlarged lymph nodes no skin rashes. No other focal symptoms suggesting active infection. He continues to smoke 1 pack per day of cigarettes. Continues to have intermittent COPD exacerbation which she is on Symbicort Uses Xanax when necessary for the jitters No recent weight loss   MEDICAL HISTORY:  Past Medical History  Diagnosis Date  . COPD (chronic obstructive pulmonary disease) ([place])   . High cholesterol   . Chronic lower back pain   . MRSA infection   . PUD (peptic ulcer disease)   . Hypertension   . Collagenous colitis 2014    [facility]  AAA being monitored with ultrasounds at the [place]  SURGICAL HISTORY: Past Surgical History    Procedure Laterality Date  . Back surgery    . Joint replacement      left hip surgery - January 26, 2014  . Rotator cuff repair      November 2014  . Elbow bursa surgery    . Esophagogastroduodenoscopy N/A 10/28/2014    SLF: 1. Esophagitis due to GERD. KOH neg. 2. Small hiatal hernia 3. Moderate erosive gastritis 4. RUQ pain due to large ulcer 5. [place] duodentis in the bulb.   . Colonoscopy  2001    Dr. Sharlett Iles, normal  . Colonoscopy  04/2013    [facility]: Fentanyl 255m/versed 14m sigmoid diverticulosis, normal terminal ileum, small internal hemorrhoids, random colon bx: collagenous colitis. next tcs 04/2018  . Esophagogastroduodenoscopy N/A 05/19/2015    SLF: 1. Mild distal esophagitis 2. Peptic stricture at the gastroesophageal junction 3. Mild non-erosive gastritis 4. Pseudo pylorus due to prior PUD.   . Marland Kitchensophageal dilation N/A 05/19/2015    Procedure: ESOPHAGEAL DILATION;  Surgeon: SaDanie BinderMD;  Location: [facility];  Service: Endoscopy;  Laterality: N/A;  . Colonoscopy  2011    [facility]: two polyps, sessile serrated adenoma, mild diverticulosis  Skin biopsy of the face 2  SOCIAL HISTORY: Social History   Social History  . Marital Status: Married    Spouse Name: N/A  . Number of Children: N/A  . Years of Education: N/A   Occupational History  . Not on file.   Social History Main Topics  . Smoking status: Current Every Day Smoker -- 1.00 packs/day  for 40 years    Types: Cigarettes  . Smokeless tobacco: Never Used  . Alcohol Use: No  . Drug Use: No  . Sexual Activity: Not on file   Other Topics Concern  . Not on file   Social History Narrative    FAMILY HISTORY: Family History  Problem Relation Age of Onset  . Diabetes Other   . Heart attack    . Cancer Mother   . Colon cancer Neg Hx   . Liver disease Neg Hx   . Ulcers Neg Hx   . Diabetes type II Father   . Heart attack Father   . Arthritis Sister   . Stroke Brother   . Coronary artery disease  Brother     ALLERGIES:  is allergic to bee venom; hydrocodone; penicillins; tramadol; and sulfa antibiotics.  MEDICATIONS:  Current Outpatient Prescriptions  Medication Sig Dispense Refill  . albuterol (PROVENTIL HFA;VENTOLIN HFA) 108 (90 BASE) MCG/ACT inhaler Inhale 2 puffs into the lungs every 6 (six) hours as needed for wheezing or shortness of breath.    Marland Kitchen albuterol (PROVENTIL) (2.5 MG/3ML) 0.083% nebulizer solution Take 2.5 mg by nebulization every 6 (six) hours as needed for wheezing or shortness of breath.    . ALPRAZolam (XANAX) 1 MG tablet Take 1 mg by mouth 2 (two) times daily. For anxiety    . atorvastatin (LIPITOR) 80 MG tablet Take 80 mg by mouth every evening.    . Budesonide (UCERIS) 9 MG TB24 Take 9 mg by mouth daily. 30 tablet 1  . cholecalciferol (VITAMIN D) 1000 UNITS tablet Take 1,000 Units by mouth 4 (four) times daily.     Marland Kitchen EPINEPHrine 0.3 mg/0.3 mL IJ SOAJ injection Inject 0.3 mg into the muscle as needed (allergic reaction).    . hydrocortisone (ANUSOL-HC) 2.5 % rectal cream Place 1 application rectally 2 (two) times daily. 30 g 0  . lidocaine (XYLOCAINE) 2 % solution 2 TSP  PO QAC AND HS TO PREVENT ABDOMINAL PAIN AFTER EATING as needed. 200 mL 3  . lisinopril (PRINIVIL,ZESTRIL) 10 MG tablet Take 10 mg by mouth daily.    . meloxicam (MOBIC) 15 MG tablet Take 15 mg by mouth daily.    . metroNIDAZOLE (FLAGYL) 500 MG tablet Take 1 tablet (500 mg total) by mouth 3 (three) times daily. 30 tablet 0  . omeprazole (PRILOSEC) 20 MG capsule TAKE ONE CAPSULE BY MOUTH TWICE DAILY BEFORE  A  MEAL 60 capsule 11  . ramelteon (ROZEREM) 8 MG tablet Take 1 tablet (8 mg total) by mouth at bedtime. As needed. 30 tablet 0  . SYMBICORT 80-4.5 MCG/ACT inhaler Inhale 2 puffs into the lungs 2 (two) times daily.     Marland Kitchen venlafaxine XR (EFFEXOR-XR) 150 MG 24 hr capsule Take 150 mg by mouth daily with breakfast.     No current facility-administered medications for this visit.    REVIEW OF  SYSTEMS:    10 Point review of Systems was done is negative except as noted above.  PHYSICAL EXAMINATION: ECOG PERFORMANCE STATUS: 2 - Symptomatic, <50% confined to bed  . Filed Vitals:   05/06/16 1042  BP: 121/75  Pulse: 82  Temp: 98.5 F (36.9 C)  Resp: 18   Filed Weights   05/06/16 1042  Weight: 190 lb 14.4 oz (86.592 kg)   .Body mass index is 27.39 kg/(m^2).  GENERAL:alert, in no acute distress and comfortable SKIN: skin color, texture, turgor are normal, no rashes or significant lesions EYES: normal, conjunctiva are  pink and non-injected, sclera clear OROPHARYNX:no exudate, no erythema and lips, buccal mucosa, and tongue normal  NECK: supple, no JVD, thyroid normal size, non-tender, without nodularity LYMPH:  no palpable lymphadenopathy in the cervical, axillary or inguinal LUNGS: clear to auscultation with normal respiratory effort HEART: regular rate & rhythm,  no murmurs and no lower extremity edema ABDOMEN: abdomen soft, non-tender, normoactive bowel sounds , No palpable hepatosplenomegaly Musculoskeletal: no cyanosis of digits and no clubbing  PSYCH: alert & oriented x 3 with fluent speech NEURO: no focal motor/sensory deficits  LABORATORY DATA:  I have reviewed the data as listed  . CBC Latest Ref Rng 05/06/2016 08/18/2015 05/19/2015  WBC 4.0 - 10.5 K/uL 8.9 8.0 9.8  Hemoglobin 13.0 - 17.0 g/dL 14.4 13.8 15.0  Hematocrit 39.0 - 52.0 % 43.3 40.7 45.2  Platelets 150 - 400 K/uL 338 331 311    . CMP Latest Ref Rng 05/06/2016 08/18/2015 05/19/2015  Glucose 65 - 99 mg/dL 129(H) 117(H) 77  BUN 6 - 20 mg/dL 11 6(L) 16  Creatinine 0.61 - 1.24 mg/dL 0.91 0.80 0.80  Sodium 135 - 145 mmol/L 136 139 141  Potassium 3.5 - 5.1 mmol/L 4.0 4.5 4.4  Chloride 101 - 111 mmol/L 103 106 105  CO2 22 - 32 mmol/L _0 Calcium 8.9 - 10.3 mg/dL 8.9 9.1 8.8(L)  Total Protein 6.5 - 8.1 g/dL 6.8 - 6.2(L)  Total Bilirubin 0.3 - 1.2 mg/dL 0.4 - 0.5  Alkaline Phos 38 - 126 U/L 70 - 66   AST 15 - 41 U/L 16 - 15  ALT 17 - 63 U/L 12(L) - 14(L)     RADIOGRAPHIC STUDIES: I have personally reviewed the radiological images as listed and agreed with the findings in the report. No results found.  ASSESSMENT & PLAN:   60 year old white male with COPD with active smoking with  #1 transient neutrophilia/leukocytosis. This was likely related to his steroid injection. CBC today shows resolution of neutrophilia/leukocytosis. He has no focal symptoms suggesting active infection at this time. No lymphadenopathy or hepatosplenomegaly to suggest a lymphoproliferative or myeloproliferative syndrome. Peripheral blood smear does not show any immature myelocytes. CBC today is completely normal blood counts. Plan -No indication for additional workup at this time. -No indication for bone marrow biopsy at this time. -Patient's neutrophilia appears to be reactive related to steroids or inflammation from COPD or smoking. -Counseled on smoking cessation -Could consider low-dose CT scan of chest with his primary care physician. -He notes he has had colonoscopy with removal of 3 polyps of 2016.  Return to care with his primary care physician Return to care with Dr. Whitney Muse in 3 months as needed to monitor for leukocytosis.  All of the patients questions were answered with apparent satisfaction. The patient knows to call the clinic with any problems, questions or concerns.  I spent 40 minutes counseling the patient face to face. The total time spent in the appointment was 45 minutes and more than 50% was on counseling and direct patient cares.    Sullivan Lone MD [place] AAHIVMS [facility] Hematology/Oncology Physician [facility]  (Office):       [phone] (Work cell):  [phone] (Fax):           [phone]

## 2016-05-15 ENCOUNTER — Telehealth (HOSPITAL_COMMUNITY): Payer: Self-pay | Admitting: *Deleted

## 2016-05-16 NOTE — Telephone Encounter (Signed)
Patients wife just called and I spoke with her to tell her the lab results and told her the appointment for his labs and follow up visit so that she could ask her questions then.

## 2016-05-16 NOTE — Telephone Encounter (Signed)
-----   Message from Baird Cancer, PA-C sent at 05/15/2016  6:05 PM EDT ----- Normal WBC.  Return as scheduled TK ----- Message -----    From: Coralie Keens Nakeya Adinolfi, RN    Sent: 05/15/2016   4:01 PM      To: Baird Cancer, PA-C  Patients wife wants to know lab results.

## 2016-05-26 ENCOUNTER — Encounter (HOSPITAL_COMMUNITY): Payer: Self-pay | Admitting: Emergency Medicine

## 2016-05-26 ENCOUNTER — Emergency Department (HOSPITAL_COMMUNITY)
Admission: EM | Admit: 2016-05-26 | Discharge: 2016-05-26 | Disposition: A | Discharge status: Home / Self Care | Payer: Medicare Other | Attending: Emergency Medicine | Admitting: Emergency Medicine

## 2016-05-26 DIAGNOSIS — Z79899 Other long term (current) drug therapy: Secondary | ICD-10-CM | POA: Insufficient documentation

## 2016-05-26 DIAGNOSIS — X58XXXA Exposure to other specified factors, initial encounter: Secondary | ICD-10-CM | POA: Diagnosis not present

## 2016-05-26 DIAGNOSIS — Y929 Unspecified place or not applicable: Secondary | ICD-10-CM | POA: Insufficient documentation

## 2016-05-26 DIAGNOSIS — Y9389 Activity, other specified: Secondary | ICD-10-CM | POA: Insufficient documentation

## 2016-05-26 DIAGNOSIS — Y999 Unspecified external cause status: Secondary | ICD-10-CM | POA: Insufficient documentation

## 2016-05-26 DIAGNOSIS — F17210 Nicotine dependence, cigarettes, uncomplicated: Secondary | ICD-10-CM | POA: Insufficient documentation

## 2016-05-26 DIAGNOSIS — S46911A Strain of unspecified muscle, fascia and tendon at shoulder and upper arm level, right arm, initial encounter: Secondary | ICD-10-CM | POA: Diagnosis not present

## 2016-05-26 DIAGNOSIS — S4991XA Unspecified injury of right shoulder and upper arm, initial encounter: Secondary | ICD-10-CM | POA: Diagnosis present

## 2016-05-26 DIAGNOSIS — I10 Essential (primary) hypertension: Secondary | ICD-10-CM | POA: Diagnosis not present

## 2016-05-26 DIAGNOSIS — J449 Chronic obstructive pulmonary disease, unspecified: Secondary | ICD-10-CM | POA: Insufficient documentation

## 2016-05-26 MED ORDER — METHYLPREDNISOLONE SODIUM SUCC 125 MG IJ SOLR
125.0000 mg | Freq: Once | INTRAMUSCULAR | Status: AC
  Administered 2016-05-26: 125 mg via INTRAMUSCULAR
  Filled 2016-05-26: qty 2

## 2016-05-26 MED ORDER — INDOMETHACIN 25 MG PO CAPS
25.0000 mg | ORAL_CAPSULE | Freq: Once | ORAL | Status: AC
  Administered 2016-05-26: 25 mg via ORAL
  Filled 2016-05-26: qty 1

## 2016-05-26 MED ORDER — OXYCODONE-ACETAMINOPHEN 5-325 MG PO TABS
1.0000 | ORAL_TABLET | Freq: Once | ORAL | Status: AC
Start: 2016-05-26 — End: 2016-05-26
  Administered 2016-05-26: 1 via ORAL
  Filled 2016-05-26: qty 1

## 2016-05-26 MED ORDER — DICLOFENAC SODIUM 75 MG PO TBEC: 75.0000 mg | DELAYED_RELEASE_TABLET | Freq: Two times a day (BID) | ORAL | Status: DC

## 2016-05-26 MED ORDER — ONDANSETRON HCL 4 MG PO TABS
4.0000 mg | ORAL_TABLET | Freq: Once | ORAL | Status: AC
Start: 2016-05-26 — End: 2016-05-26
  Administered 2016-05-26: 4 mg via ORAL
  Filled 2016-05-26: qty 1

## 2016-05-26 MED ORDER — OXYCODONE-ACETAMINOPHEN 5-325 MG PO TABS: 1.0000 | ORAL_TABLET | Freq: Four times a day (QID) | ORAL | Status: DC | PRN

## 2016-05-26 NOTE — Discharge Instructions (Signed)
Please use your shoulder immobilizer until seen by the orthopedic specialist. Use diclofenac daily with food. Use percocet for more severe pain. This medication may cause drowsiness. Please do not drink, drive, or participate in activity that requires concentration while taking this medication.

## 2016-05-26 NOTE — ED Provider Notes (Signed)
CSN: [id number]     Arrival date & time 05/26/16  1533 History  By signing my name below, I, Garrett Klein, attest that this documentation has been prepared under the direction and in the presence of [name], PA-C.  Electronically Signed: [name], ED Scribe. 05/26/2016. 6:05 PM.   Chief Complaint  Patient presents with  . Shoulder Pain   Patient is a 60 y.o. male presenting with shoulder pain. The history is provided by the patient. No language interpreter was used.  Shoulder Pain Location:  Shoulder Time since incident:  3 days Injury: no   Shoulder location:  R shoulder Pain details:    Radiates to:  Does not radiate   Severity:  Severe   Onset quality:  Sudden   Duration:  3 days   Timing:  Constant   Progression:  Unchanged Chronicity:  New Worsened by:  Movement Associated symptoms: decreased range of motion   Associated symptoms: no muscle weakness, no numbness and no tingling     HPI Comments: Garrett Klein is a 60 y.o. male with PMHx COPD and PSHx rotator cuff repair 1-2 years ago who presents to the Emergency Department complaining of sudden onset, constant, right shoulder pain x 3 days. Pt reports that 3 days ago he was pulling the starter on a lawn mower when he began having immediate pain to the shoulder. Pt had similar pain in the past when he tore his rotator cuff.  Denies weakness, numbness, tingling, or any other associated symptoms.   Past Medical History  Diagnosis Date  . COPD (chronic obstructive pulmonary disease) ([place])   . High cholesterol   . Chronic lower back pain   . MRSA infection   . PUD (peptic ulcer disease)   . Hypertension   . Collagenous colitis 2014    [facility]   Past Surgical History  Procedure Laterality Date  . Back surgery    . Joint replacement      left hip surgery - January 26, 2014  . Rotator cuff repair      November 2014  . Elbow bursa surgery    . Esophagogastroduodenoscopy N/A 10/28/2014    SLF: 1. Esophagitis  due to GERD. KOH neg. 2. Small hiatal hernia 3. Moderate erosive gastritis 4. RUQ pain due to large ulcer 5. [place] duodentis in the bulb.   . Colonoscopy  2001    Dr. Sharlett Iles, normal  . Colonoscopy  04/2013    [facility]: Fentanyl 200mg [place] Bad 10mg : sigmoid diverticulosis, normal terminal ileum, small internal hemorrhoids, random colon bx: collagenous colitis. next tcs 04/2018  . Esophagogastroduodenoscopy N/A 05/19/2015    SLF: 1. Mild distal esophagitis 2. Peptic stricture at the gastroesophageal junction 3. Mild non-erosive gastritis 4. Pseudo pylorus due to prior PUD.   Marland Kitchen Esophageal dilation N/A 05/19/2015    Procedure: ESOPHAGEAL DILATION;  Surgeon: Danie Binder, MD;  Location[facility];  Service: Endoscopy;  Laterality: N/A;  . Colonoscopy  2011    [facility]: two polyps, sessile serrated adenoma, mild diverticulosis   Family History  Problem Relation Age of Onset  . Diabetes Other   . Heart attack    . Cancer Mother   . Colon cancer Neg Hx   . Liver disease Neg Hx   . Ulcers Neg Hx   . Diabetes type II Father   . Heart attack Father   . Arthritis Sister   . Stroke Brother   . Coronary artery disease Brother  Social History  Substance Use Topics  . Smoking status: Current Every Day Smoker -- 1.00 packs/day for 40 years    Types: Cigarettes  . Smokeless tobacco: Never Used  . Alcohol Use: No    Review of Systems  Musculoskeletal: Positive for arthralgias.  Skin: Negative for wound.  Neurological: Negative for weakness and numbness.  All other systems reviewed and are negative.  Allergies  Bee venom; Hydrocodone; Penicillins; Tramadol; and Sulfa antibiotics  Home Medications   Prior to Admission medications   Medication Sig Start Date End Date Taking? Authorizing Provider  albuterol (PROVENTIL HFA;VENTOLIN HFA) 108 (90 BASE) MCG/ACT inhaler Inhale 2 puffs into the lungs every 6 (six) hours as needed for wheezing or shortness of breath.    Historical Provider, MD   albuterol (PROVENTIL) (2.5 MG/3ML) 0.083% nebulizer solution Take 2.5 mg by nebulization every 6 (six) hours as needed for wheezing or shortness of breath.    Historical Provider, MD  ALPRAZolam [name]) 1 MG tablet Take 1 mg by mouth 2 (two) times daily. For anxiety    Historical Provider, MD  atorvastatin (LIPITOR) 80 MG tablet Take 80 mg by mouth every evening.    Historical Provider, MD  Budesonide (UCERIS) 9 MG TB24 Take 9 mg by mouth daily. 08/25/15   [name], PA-C  cholecalciferol (VITAMIN D) 1000 UNITS tablet Take 1,000 Units by mouth 4 (four) times daily.     Historical Provider, MD  EPINEPHrine 0.3 mg/0.3 mL IJ SOAJ injection Inject 0.3 mg into the muscle as needed (allergic reaction).    Historical Provider, MD  hydrocortisone (ANUSOL-HC) 2.5 % rectal cream Place 1 application rectally 2 (two) times daily. 08/23/15   [name], PA-C  lidocaine (XYLOCAINE) 2 % solution 2 TSP  PO QAC AND HS TO PREVENT ABDOMINAL PAIN AFTER EATING as needed. 11/30/14   [name], PA-C  lisinopril (PRINIVIL,ZESTRIL) 10 MG tablet Take 10 mg by mouth daily.    Historical Provider, MD  meloxicam (MOBIC) 15 MG tablet Take 15 mg by mouth daily.    Historical Provider, MD  metroNIDAZOLE (FLAGYL) 500 MG tablet Take 1 tablet (500 mg total) by mouth 3 (three) times daily. 08/18/15   [name], PA-C  omeprazole (PRILOSEC) 20 MG capsule TAKE ONE CAPSULE BY MOUTH TWICE DAILY BEFORE  A  MEAL 01/22/16   [name], PA-C  ramelteon (ROZEREM) 8 MG tablet Take 1 tablet (8 mg total) by mouth at bedtime. As needed. 08/23/15   [name], PA-C  SYMBICORT 80-4.5 MCG/ACT inhaler Inhale 2 puffs into the lungs 2 (two) times daily.  04/24/15   Historical Provider, MD  venlafaxine XR (EFFEXOR-XR) 150 MG 24 hr capsule Take 150 mg by mouth daily with breakfast.    Historical Provider, MD   BP 115/82 mmHg  Pulse 92  Temp(Src) 98.4 F (36.9 C) (Oral)  Resp 18  Ht 5\' 10"  (1.778 m)  Wt 190 lb (86.183 kg)  BMI  27.26 kg/m2  SpO2 100%   Physical Exam  Constitutional: He is oriented to person, place, and time. He appears well-developed and well-nourished. No distress.  HENT:  Head: Normocephalic and atraumatic.  Eyes: Conjunctivae and EOM are normal.  Neck: Neck supple. No tracheal deviation present.  Cardiovascular: Normal rate, regular rhythm and normal heart sounds.   Pulmonary/Chest: Effort normal. No respiratory distress. He has no wheezes. He has rhonchi. He has no rales.  Symmetrical rise and fall of chest. Bilateral rhonchi present.  Musculoskeletal:  He exhibits tenderness.  Cap refill < 2 seconds.  Radial pulse is 2 + on right.  There are no temperature changes of the upper extremities.  There is no evidence of dislocation of the right shoulder.  There is no hematoma of the bicep tricep area.  Pain to palpation to the anterior right shoulder.  Pain with attempted range of motion of the shoulder.  Limited ROM of the right shoulder due to pain.   Neurological: He is alert and oriented to person, place, and time.  Skin: Skin is warm and dry.  Psychiatric: He has a normal mood and affect. His behavior is normal.  Nursing note and vitals reviewed.   ED Course  Procedures (including critical care time)  DIAGNOSTIC STUDIES: Oxygen Saturation is 100% on RA, normal by my interpretation.    COORDINATION OF CARE: 6:03 PM-Discussed treatment plan which includes follow up with orthopedist with pt at bedside and pt agreed to plan.   Labs Review Labs Reviewed - No data to display  Imaging Review No results found. I have personally reviewed and evaluated these images and lab results as part of my medical decision-making.   EKG Interpretation None      MDM  Vital signs stable. No neurovascular deficit. Exam suggest shoulder strain, and possible aggravation of previous rotator cuff tear. Rx for diclofenac and percocet given to the patient. Pt referred to orthopedics.   Final  diagnoses:  Right shoulder strain, initial encounter    **I have reviewed nursing notes, vital signs, and all appropriate lab and imaging results for this patient.[name], PA-C 05/28/16 [place], MD 05/31/16 [phone]

## 2016-05-26 NOTE — ED Notes (Signed)
Patient c/o left shoulder pain. Per patient injured shoulder 3 days ago while trying to start pull start lawn mower. Per patient has had rotator cuff tear with repair in past and pain is similar. Limited ROM.

## 2016-07-24 ENCOUNTER — Ambulatory Visit: Payer: Medicare Other | Admitting: Cardiology

## 2016-07-31 ENCOUNTER — Ambulatory Visit: Payer: Medicare Other | Admitting: Cardiology

## 2016-08-06 ENCOUNTER — Encounter (HOSPITAL_COMMUNITY): Payer: Medicare Other

## 2016-08-06 ENCOUNTER — Encounter (HOSPITAL_COMMUNITY): Payer: Medicare Other | Attending: Hematology & Oncology | Admitting: Hematology & Oncology

## 2016-08-06 VITALS — BP 140/74 | HR 79 | Temp 98.3°F | Resp 18 | Wt 198.2 lb

## 2016-08-06 DIAGNOSIS — Z720 Tobacco use: Secondary | ICD-10-CM | POA: Diagnosis not present

## 2016-08-06 DIAGNOSIS — Z79899 Other long term (current) drug therapy: Secondary | ICD-10-CM | POA: Insufficient documentation

## 2016-08-06 DIAGNOSIS — J449 Chronic obstructive pulmonary disease, unspecified: Secondary | ICD-10-CM | POA: Diagnosis not present

## 2016-08-06 DIAGNOSIS — F17210 Nicotine dependence, cigarettes, uncomplicated: Secondary | ICD-10-CM | POA: Diagnosis not present

## 2016-08-06 DIAGNOSIS — F17200 Nicotine dependence, unspecified, uncomplicated: Secondary | ICD-10-CM

## 2016-08-06 DIAGNOSIS — D72829 Elevated white blood cell count, unspecified: Secondary | ICD-10-CM

## 2016-08-06 LAB — BASIC METABOLIC PANEL
Anion gap: 10 (ref 5–15)
BUN: 9 mg/dL (ref 6–20)
CHLORIDE: 105 mmol/L (ref 101–111)
CO2: 26 mmol/L (ref 22–32)
CREATININE: 0.7 mg/dL (ref 0.61–1.24)
Calcium: 9.1 mg/dL (ref 8.9–10.3)
GFR calc non Af Amer: >60 mL/min (ref >60)
Glucose, Bld: 109 mg/dL — ABNORMAL HIGH (ref 65–99)
POTASSIUM: 3.8 mmol/L (ref 3.5–5.1)
SODIUM: 141 mmol/L (ref 135–145)

## 2016-08-06 LAB — CBC WITH DIFFERENTIAL/PLATELET
Basophils Absolute: 0.1 10*3/uL (ref 0.0–0.1)
Basophils Relative: 1 %
EOS ABS: 0.1 10*3/uL (ref 0.0–0.7)
EOS PCT: 2 %
HCT: 43.2 % (ref 39.0–52.0)
Hemoglobin: 14.6 g/dL (ref 13.0–17.0)
LYMPHS ABS: 1.6 10*3/uL (ref 0.7–4.0)
Lymphocytes Relative: 18 %
MCH: 31.3 pg (ref 26.0–34.0)
MCHC: 33.8 g/dL (ref 30.0–36.0)
MCV: 92.5 fL (ref 78.0–100.0)
MONOS PCT: 7 %
Monocytes Absolute: 0.6 10*3/uL (ref 0.1–1.0)
Neutro Abs: 6.5 10*3/uL (ref 1.7–7.7)
Neutrophils Relative %: 72 %
PLATELETS: 280 10*3/uL (ref 150–400)
RBC: 4.67 MIL/uL (ref 4.22–5.81)
RDW: 14.5 % (ref 11.5–15.5)
WBC: 8.9 10*3/uL (ref 4.0–10.5)

## 2016-08-06 LAB — RETICULOCYTES
RBC.: 4.67 MIL/uL (ref 4.22–5.81)
RETIC CT PCT: 1.2 % (ref 0.4–3.1)
Retic Count, Absolute: 56 10*3/uL (ref 19.0–186.0)

## 2016-08-06 NOTE — Progress Notes (Signed)
HEMATOLOGY/ONCOLOGY CONSULTATION NOTE  Date of Service: 05/06/2016  Patient Care Team: [name], MD as PCP - General (Internal Medicine) Danie Binder, MD as Consulting Physician (Gastroenterology)  CHIEF COMPLAINTS/PURPOSE OF CONSULTATION:  Leukocytosis  HISTORY OF PRESENTING ILLNESS:  Garrett Klein is a wonderful 60 y.o. male who has been referred to [place] by Dr [name], MD  for evaluation and management of leukocytosis.  Patient is accompanied by his wife. He has experienced some chest tightness. He says his cardiologist recently moved to [place], so he will begin seeing Dr. Domenic Polite. His previous cardiologist conducted a nuclear test a few years ago and patient says that it was normal.  Mr. Enis reports lumps in his underarms that appear for 3-4 days and then disappear.  Patient also reports swelling in his lower extremities. He says his legs get so swollen, "they look like they are going to bust open." The swelling lasts for several days. He currently denies any problems.   Patient has gained eight pounds from when he saw his PCP recently until today, although he has poor appetite and feels bloated. Patient has had a colonoscopy and sees a gastroenterologist, Dr. Oneida Alar.  He goes through [place] for most of his health care.  Patient has been smoking most of his life.  He was relieved to note that at his last visit his WBC count was WNL. He is here today for ongoing follow-up.   MEDICAL HISTORY:  Past Medical History:  Diagnosis Date  . Chronic lower back pain   . Collagenous colitis [address] [place]  . COPD (chronic obstructive pulmonary disease) ([place])   . High cholesterol   . Hypertension   . MRSA infection   . PUD (peptic ulcer disease)   AAA being monitored with ultrasounds at the [place]  SURGICAL HISTORY: Past Surgical History:  Procedure Laterality Date  . BACK SURGERY    . COLONOSCOPY  2001   Dr. Sharlett Iles, normal  . COLONOSCOPY  04/2013   [facility]:  Fentanyl 263m/versed 137m sigmoid diverticulosis, normal terminal ileum, small internal hemorrhoids, random colon bx: collagenous colitis. next tcs 04/2018  . [facility]  [facility] sessile serrated adenoma, mild diverticulosis  . ELBOW BURSA SURGERY    . ESOPHAGEAL DILATION N/A 05/19/2015   Procedure: ESOPHAGEAL DILATION;  Surgeon: SaDanie BinderMD;  [facility];  Service: Endoscopy;  Laterality: N/A;  . ESOPHAGOGASTRODUODENOSCOPY N/A 10/28/2014   SLF: 1. Esophagitis due to GERD. KOH neg. 2. Small hiatal hernia 3. Moderate erosive gastritis 4. RUQ pain due to large ulcer 5. [place] in the bulb.   . ESOPHAGOGASTRODUODENOSCOPY N/A 05/19/2015   SLF: 1. Mild distal esophagitis 2. Peptic stricture at the gastroesophageal junction 3. Mild non-erosive gastritis 4. Pseudo pylorus due to prior PUD.   . Marland KitchenOINT REPLACEMENT     left hip surgery - January 26, 2014  . ROTATOR CUFF REPAIR     November 2014  Skin biopsy of the face 2  SOCIAL HISTORY: Social History   Social History  . Marital status: Married    Spouse name: N/A  . Number of children: N/A  . Years of education: N/A   Occupational History  . Not on file.   Social History Main Topics  . Smoking status: Current Every Day Smoker    Packs/day: 1.00    Years: 40.00    Types: Cigarettes  . Smokeless tobacco: Never Used  . Alcohol use  No  . Drug use: No  . Sexual activity: Not on file   Other Topics Concern  . Not on file   Social History Narrative  . No narrative on file    FAMILY HISTORY: Family History  Problem Relation Age of Onset  . Diabetes Other   . Heart attack    . Cancer Mother   . Colon cancer Neg Hx   . Liver disease Neg Hx   . Ulcers Neg Hx   . Diabetes type II Father   . Heart attack Father   . Arthritis Sister   . Stroke Brother   . Coronary artery disease Brother     ALLERGIES:  is allergic to bee venom; ciprofloxacin; hydrocodone; penicillins; tramadol; and  sulfa antibiotics.  MEDICATIONS:  Current Outpatient Prescriptions  Medication Sig Dispense Refill  . albuterol (PROVENTIL HFA;VENTOLIN HFA) 108 (90 BASE) MCG/ACT inhaler Inhale 2 puffs into the lungs every 6 (six) hours as needed for wheezing or shortness of breath.    Marland Kitchen albuterol (PROVENTIL) (2.5 MG/3ML) 0.083% nebulizer solution Take 2.5 mg by nebulization every 6 (six) hours as needed for wheezing or shortness of breath.    . ALPRAZolam (XANAX) 1 MG tablet Take 1 mg by mouth 2 (two) times daily. For anxiety    . atorvastatin (LIPITOR) 80 MG tablet Take 80 mg by mouth every evening.    . Budesonide (UCERIS) 9 MG TB24 Take 9 mg by mouth daily. 30 tablet 1  . cholecalciferol (VITAMIN D) 1000 UNITS tablet Take 1,000 Units by mouth 4 (four) times daily.     . diclofenac (VOLTAREN) 75 MG EC tablet Take 1 tablet (75 mg total) by mouth 2 (two) times daily. 12 tablet 0  . EPINEPHrine 0.3 mg/0.3 mL IJ SOAJ injection Inject 0.3 mg into the muscle as needed (allergic reaction).    . hydrocortisone (ANUSOL-HC) 2.5 % rectal cream Place 1 application rectally 2 (two) times daily. 30 g 0  . lidocaine (XYLOCAINE) 2 % solution 2 TSP  PO QAC AND HS TO PREVENT ABDOMINAL PAIN AFTER EATING as needed. 200 mL 3  . lisinopril (PRINIVIL,ZESTRIL) 10 MG tablet Take 10 mg by mouth daily.    . meloxicam (MOBIC) 15 MG tablet Take 15 mg by mouth daily.    . metroNIDAZOLE (FLAGYL) 500 MG tablet Take 1 tablet (500 mg total) by mouth 3 (three) times daily. 30 tablet 0  . omeprazole (PRILOSEC) 20 MG capsule TAKE ONE CAPSULE BY MOUTH TWICE DAILY BEFORE  A  MEAL 60 capsule 11  . oxyCODONE-acetaminophen (PERCOCET/ROXICET) 5-325 MG tablet Take 1 tablet by mouth every 6 (six) hours as needed. 12 tablet 0  . ramelteon (ROZEREM) 8 MG tablet Take 1 tablet (8 mg total) by mouth at bedtime. As needed. 30 tablet 0  . SYMBICORT 80-4.5 MCG/ACT inhaler Inhale 2 puffs into the lungs 2 (two) times daily.     Marland Kitchen venlafaxine XR (EFFEXOR-XR) 150  MG 24 hr capsule Take 150 mg by mouth daily with breakfast.     No current facility-administered medications for this visit.     REVIEW OF SYSTEMS:   Positive for chest pain, edema in lower extremities, poor appetite, and abdominal bloating.  14 point review of systems was performed and is negative except as detailed under history of present illness and above   PHYSICAL EXAMINATION: ECOG PERFORMANCE STATUS: 1 - Symptomatic but completely ambulatory  . Vitals:   08/06/16 1330  BP: 140/74  Pulse: 79  Resp: 18  Temp: 98.3 F (36.8 C)   Filed Weights   08/06/16 1330  Weight: 198 lb 3.2 oz (89.9 kg)   .Body mass index is 28.44 kg/m.  GENERAL:alert, in no acute distress and comfortable SKIN: skin color, texture, turgor are normal, no rashes or significant lesions EYES: normal, conjunctiva are pink and non-injected, sclera clear OROPHARYNX:no exudate, no erythema and lips, buccal mucosa, and tongue normal  NECK: supple, no JVD, thyroid normal size, non-tender, without nodularity LYMPH:  no palpable lymphadenopathy in the cervical, axillary or inguinal LUNGS: clear to auscultation with normal respiratory effort HEART: regular rate & rhythm,  no murmurs and no lower extremity edema ABDOMEN: abdomen soft, non-tender, normoactive bowel sounds , No palpable hepatosplenomegaly Musculoskeletal: no cyanosis of digits and no clubbing  PSYCH: alert & oriented x 3 with fluent speech NEURO: no focal motor/sensory deficits  LABORATORY DATA:  I have reviewed the data as listed  . CBC Latest Ref Rng & Units 05/06/2016 08/18/2015 05/19/2015  WBC 4.0 - 10.5 K/uL 8.9 8.0 9.8  Hemoglobin 13.0 - 17.0 g/dL 14.4 13.8 15.0  Hematocrit 39.0 - 52.0 % 43.3 40.7 45.2  Platelets 150 - 400 K/uL 338 331 311    . CMP Latest Ref Rng & Units 05/06/2016 08/18/2015 05/19/2015  Glucose 65 - 99 mg/dL 129(H) 117(H) 77  BUN 6 - 20 mg/dL 11 6(L) 16  Creatinine 0.61 - 1.24 mg/dL 0.91 0.80 0.80  Sodium 135 - 145  mmol/L 136 139 141  Potassium 3.5 - 5.1 mmol/L 4.0 4.5 4.4  Chloride 101 - 111 mmol/L 103 106 105  CO2 22 - 32 mmol/L _0 Calcium 8.9 - 10.3 mg/dL 8.9 9.1 8.8(L)  Total Protein 6.5 - 8.1 g/dL 6.8 - 6.2(L)  Total Bilirubin 0.3 - 1.2 mg/dL 0.4 - 0.5  Alkaline Phos 38 - 126 U/L 70 - 66  AST 15 - 41 U/L 16 - 15  ALT 17 - 63 U/L 12(L) - 14(L)     RADIOGRAPHIC STUDIES: I have personally reviewed the radiological images as listed and agreed with the findings in the report. No results found.  ASSESSMENT & PLAN:  Neutrophilia/leukocytosis COPD Tobacco Use  60 year old white male with COPD with active smoking with  #1 transient neutrophilia/leukocytosis. This was likely related to his prior steroid injection. Repeat CBC's show resolution of neutrophilia/leukocytosis. He has no focal symptoms suggesting active infection at this time. No lymphadenopathy or hepatosplenomegaly to suggest a lymphoproliferative or myeloproliferative syndrome. Peripheral blood smear does not show any immature myelocytes. CBC today is completely normal blood counts.  Plan -No indication for additional workup at this time. -No indication for bone marrow biopsy at this time. -Patient's neutrophilia appears to be reactive related to steroids or inflammation from COPD or smoking. -Counseled on smoking cessation -Could consider low-dose CT scan of chest with his primary care physician.  Follow up with patient in 6 months. If counts remain normal at follow-up will discharge back to PCP.  All of the patients questions were answered with apparent satisfaction. The patient knows to call the clinic with any problems, questions or concerns.  This document serves as a record of services personally performed by Ancil Linsey, MD. It was created on her behalf by Elmyra Ricks, a trained medical scribe. The creation of this record is based on the scribe's personal observations and the provider's statements to them. This  document has been checked and approved by the attending provider.  I have reviewed the above documentation for accuracy  and completeness and I agree with the above.  This note was electronically signed.    Molli Hazard, MD   08/06/2016 5:40 PM

## 2016-08-06 NOTE — Patient Instructions (Addendum)
[facility] at [facility] Discharge Instructions  RECOMMENDATIONS MADE BY THE CONSULTANT AND ANY TEST RESULTS WILL BE SENT TO YOUR REFERRING PHYSICIAN.  You saw Dr. Penland today. Follow up in 6 months with lab work.  Thank you for choosing [facility] at [facility] to provide your oncology and hematology care.  To afford each patient quality time with our provider, please arrive at least 15 minutes before your scheduled appointment time.   Beginning January 23rd 2017 lab work for the Cancer Center will be done in the  Main lab at [place] on 1st floor. If you have a lab appointment with the Cancer Center please come in thru the  Main Entrance and check in at the main information desk  You need to re-schedule your appointment should you arrive 10 or more minutes late.  We strive to give you quality time with our providers, and arriving late affects you and other patients whose appointments are after yours.  Also, if you no show three or more times for appointments you may be dismissed from the clinic at the providers discretion.     Again, thank you for choosing [facility].  Our hope is that these requests will decrease the amount of time that you wait before being seen by our physicians.       _____________________________________________________________  Should you have questions after your visit to [facility], please contact our office at [phone] between the hours of 8:30 a.m. and 4:30 p.m.  Voicemails left after 4:30 p.m. will not be returned until the following business day.  For prescription refill requests, have your pharmacy contact our office.         Resources For Cancer Patients and their Caregivers ? American Cancer Society: Can assist with transportation, wigs, general needs, runs Look Good Feel Better.        1-888-227-6333 ? Cancer Care: Provides financial assistance, online support  groups, medication/co-pay assistance.  1-800-813-HOPE (4673) ? [facility] Assists [place] cancer patients and their families through emotional , educational and financial support.  336-427-4357 ? [place] DSS Where to apply for food stamps, Medicaid and utility assistance. 336-342-1394 ? [facility]: Transportation to medical appointments. 336-347-2287 ? Social Security Administration: May apply for disability if have a Stage IV cancer. 336-342-7796 1-800-772-1213 ? [place] Aging, Disability and Transit Services: Assists with nutrition, care and transit needs. 336-349-2343  Cancer Center Support Programs: @10RELATIVEDAYS@ > Cancer Support Group  2nd Tuesday of the month 1pm-2pm, Journey Room  > Creative Journey  3rd Tuesday of the month 1130am-1pm, Journey Room  > Look Good Feel Better  1st Wednesday of the month 10am-12 noon, Journey Room (Call American Cancer Society to register 1-800-395-5775)    

## 2016-08-08 ENCOUNTER — Encounter: Payer: Self-pay | Admitting: *Deleted

## 2016-08-09 ENCOUNTER — Encounter: Payer: Self-pay | Admitting: Cardiology

## 2016-08-09 ENCOUNTER — Ambulatory Visit (INDEPENDENT_AMBULATORY_CARE_PROVIDER_SITE_OTHER): Payer: Medicare Other | Admitting: Cardiology

## 2016-08-09 VITALS — BP 100/70 | HR 88 | Ht 70.0 in | Wt 190.0 lb

## 2016-08-09 DIAGNOSIS — M79604 Pain in right leg: Secondary | ICD-10-CM

## 2016-08-09 DIAGNOSIS — I2510 Atherosclerotic heart disease of native coronary artery without angina pectoris: Secondary | ICD-10-CM | POA: Diagnosis not present

## 2016-08-09 DIAGNOSIS — I2584 Coronary atherosclerosis due to calcified coronary lesion: Secondary | ICD-10-CM

## 2016-08-09 DIAGNOSIS — I10 Essential (primary) hypertension: Secondary | ICD-10-CM

## 2016-08-09 DIAGNOSIS — M79605 Pain in left leg: Secondary | ICD-10-CM

## 2016-08-09 DIAGNOSIS — M7989 Other specified soft tissue disorders: Secondary | ICD-10-CM | POA: Diagnosis not present

## 2016-08-09 DIAGNOSIS — E785 Hyperlipidemia, unspecified: Secondary | ICD-10-CM

## 2016-08-09 NOTE — Progress Notes (Signed)
Cardiology Office Note  Date: 08/09/2016   ID: DAXTEN CARDONI, DOB November 05, 1956, MRN [id number]  PCP: [name], MD  Consulting Cardiologist: Rozann Lesches, MD   Chief Complaint  Patient presents with  . Cardiac follow-up    History of Present Illness: Garrett Klein is a 60 y.o. male referred to establish cardiology follow-up. He is a former patient of Dr. Hamilton Capri with the [facility] cardiology practice, last seen in December 2016. He is here today with his wife. I reviewed his records.  He has a history of coronary artery calcifications based on chest CT imaging, but no known obstructive CAD or myocardial infarction. Stress testing from last year at [facility] is outlined below, low risk without ischemia. He states that he has not had any recurring chest pain and I reviewed his ECG today which shows sinus rhythm with right bundle branch block (old).  He tells me that 3 weeks ago he experienced sudden leg swelling, bilateral, not entirely clear about precipitant. This resolved spontaneously over a period of a few days and has not recurred. He also tells me that he is concerned about right leg pain, less so on the left side. He recalls being told he might have a "blockage" affecting his right leg.  There was also some confusion about history of aortic aneurysm. Patient recalls being told that he had an abdominal aneurysm, however based on testing I have been able to review, his abdominal aorta measuring only 2.8 cm by CT within the last 2 years, and his thoracic aorta was normal. We discussed this today.  Past Medical History:  Diagnosis Date  . Chronic lower back pain   . Collagenous colitis [address] [place]  . COPD (chronic obstructive pulmonary disease) ([place])   . Essential hypertension   . Hyperlipidemia   . MRSA infection   . PUD (peptic ulcer disease)     Past Surgical History:  Procedure Laterality Date  . BACK SURGERY    . COLONOSCOPY  2001   Dr. Sharlett Iles, normal  .  COLONOSCOPY  04/2013   [facility]: Fentanyl 200mg [place] Bad 10mg : sigmoid diverticulosis, normal terminal ileum, small internal hemorrhoids, random colon bx: collagenous colitis. next tcs 04/2018  . COLONOSCOPY  2011   [facility]: two polyps, sessile serrated adenoma, mild diverticulosis  . ELBOW BURSA SURGERY    . ESOPHAGEAL DILATION N/A 05/19/2015   Procedure: ESOPHAGEAL DILATION;  Surgeon: Danie Binder, MD;  [facility];  Service: Endoscopy;  Laterality: N/A;  . ESOPHAGOGASTRODUODENOSCOPY N/A 10/28/2014   SLF: 1. Esophagitis due to GERD. KOH neg. 2. Small hiatal hernia 3. Moderate erosive gastritis 4. RUQ pain due to large ulcer 5. [place] duodentis in the bulb.   . ESOPHAGOGASTRODUODENOSCOPY N/A 05/19/2015   SLF: 1. Mild distal esophagitis 2. Peptic stricture at the gastroesophageal junction 3. Mild non-erosive gastritis 4. Pseudo pylorus due to prior PUD.   Marland Kitchen JOINT REPLACEMENT     left hip surgery - January 26, 2014  . ROTATOR CUFF REPAIR     November 2014    Current Outpatient Prescriptions  Medication Sig Dispense Refill  . albuterol (PROVENTIL HFA;VENTOLIN HFA) 108 (90 BASE) MCG/ACT inhaler Inhale 2 puffs into the lungs every 6 (six) hours as needed for wheezing or shortness of breath.    Marland Kitchen albuterol (PROVENTIL) (2.5 MG/3ML) 0.083% nebulizer solution Take 2.5 mg by nebulization every 6 (six) hours as needed for wheezing or shortness of breath.    Marland Kitchen atorvastatin (LIPITOR) 80 MG  tablet Take 40 mg by mouth every evening.     . cholecalciferol (VITAMIN D) 1000 UNITS tablet Take 1,000 Units by mouth 4 (four) times daily.     Marland Kitchen EPINEPHrine 0.3 mg/0.3 mL IJ SOAJ injection Inject 0.3 mg into the muscle as needed (allergic reaction).    Marland Kitchen lisinopril-hydrochlorothiazide (PRINZIDE,ZESTORETIC) 10-12.5 MG tablet Take 1 tablet by mouth daily.    Marland Kitchen omeprazole (PRILOSEC) 20 MG capsule TAKE ONE CAPSULE BY MOUTH TWICE DAILY BEFORE  A  MEAL 60 capsule 11  . sildenafil (VIAGRA) 100 MG tablet Take 100 mg  by mouth daily as needed for erectile dysfunction.    . SYMBICORT 80-4.5 MCG/ACT inhaler Inhale 2 puffs into the lungs 2 (two) times daily.     Marland Kitchen umeclidinium bromide (INCRUSE ELLIPTA) 62.5 MCG/INH AEPB Inhale 1 puff into the lungs 2 (two) times daily.     No current facility-administered medications for this visit.    Allergies:  Bee venom; Ciprofloxacin; Hydrocodone; Penicillins; Tramadol; and Sulfa antibiotics   Social History: The patient  reports that he has been smoking Cigarettes.  He started smoking about 47 years ago. He has a 40.00 pack-year smoking history. He has never used smokeless tobacco. He reports that he does not drink alcohol or use drugs.   Family History: The patient's family history includes Arthritis in his sister; Cancer in his mother; Coronary artery disease in his brother; Diabetes in his other; Diabetes type II in his father; Heart attack in his father; Stroke in his brother.   ROS:  Please see the history of present illness. Otherwise, complete review of systems is positive for chronic leg pain, right greater than left.  All other systems are reviewed and negative.   Physical Exam: VS:  BP 100/70 (BP Location: Right Arm, Cuff Size: Normal)   Pulse 88   Ht 5\' 10"  (1.778 m)   Wt 190 lb (86.2 kg)   BMI 27.26 kg/m , BMI Body mass index is 27.26 kg/m.  Wt Readings from Last 3 Encounters:  08/09/16 190 lb (86.2 kg)  08/06/16 198 lb 3.2 oz (89.9 kg)  05/26/16 190 lb (86.2 kg)    General: Overweight male, appears comfortable at rest. HEENT: Conjunctiva and lids normal, oropharynx clear. Neck: Supple, no elevated JVP or carotid bruits, no thyromegaly. Lungs: Clear to auscultation, nonlabored breathing at rest. Cardiac: Regular rate and rhythm, no S3 or significant systolic murmur, no pericardial rub. Abdomen: Soft, nontender, no bruit, bowel sounds present. Extremities: No pitting edema, distal pulses 1-2+. Skin: Warm and dry. Musculoskeletal: No  kyphosis. Neuropsychiatric: Alert and oriented x3, affect grossly appropriate.  ECG: I personally reviewed the tracing from 10/09/2014 which showed normal sinus rhythm with right bundle branch block.  Recent Labwork: 08/18/2015: TSH 0.829 05/06/2016: ALT 12; AST 16 08/06/2016: BUN 9; Creatinine, Ser 0.70; Hemoglobin 14.6; Platelets 280; Potassium 3.8; Sodium 141   Other Studies Reviewed Today:  Lexiscan Myoview 08/08/2015 [facility]): No evidence of scar or ischemia with LVEF 59%.  Chest CTA 10/09/2014: IMPRESSION: No evidence of acute abnormality. No evidence of pulmonary emboli or thoracic aortic aneurysm.  Assessment and Plan:  1. Intermittent bilateral leg pain, right worse than left. He does have palpable distal pulses, however recalls being told that he might have a "blockage" affecting his right leg. We will obtain lower extremity Dopplers and ABIs.  2. Episode of bilateral leg swelling as outlined above. He has no history of cardiomyopathy. We will follow-up with an echocardiogram to assess cardiac structure and  function.  3. Questionable history of abdominal aortic aneurysm, not clearly supported by testing as outlined above. Abdominal CT in December 2015 revealed some ectasia and atherosclerosis of the abdominal aorta, however measuring only 2.8 cm which is not in range to consider aneurysm.  4. Essential hypertension, blood pressure is well controlled today.  5. Hyperlipidemia, on Lipitor.  Current medicines were reviewed with the patient today.   Orders Placed This Encounter  Procedures  . EKG 12-Lead  . ECHOCARDIOGRAM COMPLETE    Disposition: Follow-up with me in 6 months.  Signed, Satira Sark, MD, [facility] 08/09/2016 2:37 PM    [place] at [place], [place], [place] [address] Phone: [phone]; Fax: [phone]

## 2016-08-09 NOTE — Patient Instructions (Signed)
Your physician wants you to follow-up in: [place] DR. Domenic Polite You will receive a reminder letter in the mail two months in advance. If you don't receive a letter, please call our office to schedule the follow-up appointment.  Your physician recommends that you continue on your current medications as directed. Please refer to the Current Medication list given to you today.  Your physician has requested that you have an echocardiogram. Echocardiography is a painless test that uses sound waves to create images of your heart. It provides your doctor with information about the size and shape of your heart and how well your heart's chambers and valves are working. This procedure takes approximately one hour. There are no restrictions for this procedure.  Your physician has requested that you have an ankle brachial index (ABI). During this test an ultrasound and blood pressure cuff are used to evaluate the arteries that supply the arms and legs with blood. Allow thirty minutes for this exam. There are no restrictions or special instructions.  Thank you for choosing [facility]!!

## 2016-08-12 ENCOUNTER — Other Ambulatory Visit: Payer: Self-pay | Admitting: Cardiology

## 2016-08-12 DIAGNOSIS — I739 Peripheral vascular disease, unspecified: Secondary | ICD-10-CM

## 2016-08-15 ENCOUNTER — Encounter (HOSPITAL_COMMUNITY): Payer: Self-pay | Admitting: Hematology & Oncology

## 2016-08-19 ENCOUNTER — Ambulatory Visit: Payer: Medicare Other | Admitting: Gastroenterology

## 2016-08-29 ENCOUNTER — Ambulatory Visit (INDEPENDENT_AMBULATORY_CARE_PROVIDER_SITE_OTHER): Payer: Medicare Other

## 2016-08-29 ENCOUNTER — Ambulatory Visit: Payer: Medicare Other

## 2016-08-29 ENCOUNTER — Other Ambulatory Visit: Payer: Self-pay

## 2016-08-29 DIAGNOSIS — M7989 Other specified soft tissue disorders: Secondary | ICD-10-CM | POA: Diagnosis not present

## 2016-08-29 DIAGNOSIS — R200 Anesthesia of skin: Secondary | ICD-10-CM

## 2016-08-29 DIAGNOSIS — I739 Peripheral vascular disease, unspecified: Secondary | ICD-10-CM

## 2016-08-29 DIAGNOSIS — I731 Thromboangiitis obliterans [Buerger's disease]: Secondary | ICD-10-CM | POA: Diagnosis not present

## 2016-08-30 ENCOUNTER — Telehealth: Payer: Self-pay | Admitting: *Deleted

## 2016-08-30 NOTE — Telephone Encounter (Signed)
-----   Message from Satira Sark, MD sent at 08/29/2016  3:55 PM EDT ----- Results reviewed. LVEF is normal at 60-65% with mild diastolic dysfunction (cardiac muscle stiffness) which would not be expected to be causing any symptoms or leg swelling at this time. No major valvular abnormalities. Overall reassuring. A copy of this test should be forwarded to [facility], MD.

## 2016-08-30 NOTE — Telephone Encounter (Signed)
Patient informed and copy sent to PCP. 

## 2016-11-07 ENCOUNTER — Ambulatory Visit: Payer: Medicare Other | Admitting: Gastroenterology

## 2016-12-11 ENCOUNTER — Ambulatory Visit (INDEPENDENT_AMBULATORY_CARE_PROVIDER_SITE_OTHER): Payer: Medicare Other | Admitting: Gastroenterology

## 2016-12-11 ENCOUNTER — Other Ambulatory Visit: Payer: Self-pay

## 2016-12-11 ENCOUNTER — Encounter: Payer: Self-pay | Admitting: Gastroenterology

## 2016-12-11 DIAGNOSIS — R1319 Other dysphagia: Secondary | ICD-10-CM

## 2016-12-11 DIAGNOSIS — R1310 Dysphagia, unspecified: Secondary | ICD-10-CM

## 2016-12-11 DIAGNOSIS — K529 Noninfective gastroenteritis and colitis, unspecified: Secondary | ICD-10-CM

## 2016-12-11 DIAGNOSIS — K279 Peptic ulcer, site unspecified, unspecified as acute or chronic, without hemorrhage or perforation: Secondary | ICD-10-CM | POA: Diagnosis not present

## 2016-12-11 MED ORDER — BUDESONIDE 3 MG PO CPEP: ORAL_CAPSULE | ORAL | 0 refills | Status: DC

## 2016-12-11 NOTE — Assessment & Plan Note (Addendum)
SYMPTOMS NOT IDEALLY CONTROLLED AND HAD COLLAGENOUS COLITIS IN 2014 THAT WAS NOT TREATED.  ENTOCORT TAPER 9 MG DAILY FOR 1 MOS THEN 6 MG DAILY FOR 1 MOS THEN 3 MG DAILY FOR 1 MOS. DRINK WATER TO KEEP YOUR URINE LIGHT YELLOW. FOLLOW A HIGH FIBER DIET IF TOLERATED. AVOID ITEMS THAT CAUSE BLOATING & GAS.  FOLLOW UP IN 4 MOS.

## 2016-12-11 NOTE — Assessment & Plan Note (Signed)
SYMPTOMS NOT IDEALLY CONTROLLED AND HAS HAD RECENT CANDIDA ESOPHAGITIS 2017 AND EGD/DIL JUL 2016.  COMPLETE BARIUM SWALLOW. FOLLOW UP IN 4 MOS.

## 2016-12-11 NOTE — Addendum Note (Signed)
Addended by: Danie Binder on: 12/11/2016 03:02 PM   Modules accepted: Orders

## 2016-12-11 NOTE — Assessment & Plan Note (Signed)
SYMPTOMS CONTROLLED/RESOLVED. OCCASIONAL ASA USE. PMHx; PUD-LAST EGD JUL 2016 RESOLVED.  CONTINUE TO MONITOR SYMPTOMS.

## 2016-12-11 NOTE — Progress Notes (Signed)
Subjective:    Patient ID: Garrett Klein, male    DOB: 03-08-1956, 61 y.o.   MRN: [id number]  [name], MD  HPI Problems swallowing-HOSPITAL SOLIDS AND LIQUIDS, AND NOW SOME SOLIDS.,GETTING CHOKED ON CORNBREAD AND CHICKEN. SWELLS UP AND CAN'T GET IT OUT AND EVENTUALLY GOES DOWN. HAD A BAD YEAST INFECTION. IF PEED IT BURNED BAD. LAST EGD 2015: STRICTURE/PUD. EGD/DIL-JUL 2016 UP TO 16 MM WITH MINIMAL RESISTANCE. WIFE HAD HEP C BUT SHE IS UNDETECTABLE. RX AND FINISHED IN OCT 2017.  BM,s: DOING OK(NO ABDOMINAL PAIN BUT STILL LOOSE STOOLS. GETTING THERAPY FOR R ARM SURGERY(NOV 10-ROTATOR CUFF REPAIR). [place]. OMEPRAZOLE HELPS.  BMs-GOOD DAY: 2-3, BAD DAY: 6-8 (#6-7, #4: [place]). NO ASPIRIN, IBUPROFEN/MOTRIN, OR NAPROXEN/ALEVE. FEW GOODY POWDERS EVER NOW AND THEN.   PT DENIES FEVER, CHILLS, HEMATOCHEZIA,  nausea, vomiting, melena, CHEST PAIN, SHORTNESS OF BREATH, CHANGE IN BOWEL IN HABITS, OR constipation, abdominal pain, problems with sedation,   Past Medical History:  Diagnosis Date  . Chronic lower back pain   . Collagenous colitis [address] [place]  . COPD (chronic obstructive pulmonary disease) ([place])   . Essential hypertension   . Hyperlipidemia   . MRSA infection   . PUD (peptic ulcer disease)    Past Surgical History:  Procedure Laterality Date  . BACK SURGERY    . COLONOSCOPY  2001   Dr. Sharlett Iles, normal  . COLONOSCOPY  04/2013   [facility]: Fentanyl 200mg Danford Bad 10mg : sigmoid diverticulosis, normal terminal ileum, small internal hemorrhoids, random colon bx: collagenous colitis. next tcs 04/2018  . COLONOSCOPY  2011   [facility]: two polyps, sessile serrated adenoma, mild diverticulosis  . ELBOW BURSA SURGERY    . ESOPHAGEAL DILATION N/A 05/19/2015   Procedure: ESOPHAGEAL DILATION;  Surgeon: Danie Binder, MD;  [facility];  Service: Endoscopy;  Laterality: N/A;  . ESOPHAGOGASTRODUODENOSCOPY N/A 10/28/2014   SLF: 1.  Esophagitis due to GERD. KOH neg. 2. Small hiatal hernia 3. Moderate erosive gastritis 4. RUQ pain due to large ulcer 5. [place] duodentis in the bulb.   . ESOPHAGOGASTRODUODENOSCOPY N/A 05/19/2015   SLF: 1. Mild distal esophagitis 2. Peptic stricture at the gastroesophageal junction 3. Mild non-erosive gastritis 4. Pseudo pylorus due to prior PUD.   Marland Kitchen JOINT REPLACEMENT     left hip surgery - January 26, 2014  . ROTATOR CUFF REPAIR     November 2014   Allergies  Allergen Reactions  . Bee Venom Swelling  . Ciprofloxacin Other (See Comments)    Turns red from the chest up.  Marland Kitchen Hydrocodone     Rash   . Levaquin [Levofloxacin In D5w]     CANDIDA ALL OVER MOUTH TO PENIS  . Penicillins Other (See Comments)    Reaction in childhood, caused patient to pass out  . Tramadol     Hives   . Sulfa Antibiotics Rash   Current Outpatient Prescriptions  Medication Sig Dispense Refill  . albuterol (PROVENTIL HFA;VENTOLIN HFA) 108 (90 BASE) MCG/ACT inhaler Inhale 2 puffs into the lungs every 6 (six) hours as needed for wheezing or shortness of breath.    Marland Kitchen albuterol (PROVENTIL) (2.5 MG/3ML) 0.083% nebulizer solution Take 2.5 mg by nebulization every 6 (six) hours as needed for wheezing or shortness of breath.    Marland Kitchen atorvastatin (LIPITOR) 80 MG tablet Take 40 mg by mouth every evening.     . cholecalciferol (VITAMIN D) 1000 UNITS tablet Take 1,000 Units by  mouth 4 (four) times daily.     Marland Kitchen EPINEPHrine 0.3 mg/0.3 mL IJ SOAJ injection Inject 0.3 mg into the muscle as needed (allergic reaction).    . gabapentin (NEURONTIN) 400 MG capsule Take 400 mg by mouth 3 (three) times daily.    Marland Kitchen lisinopril-hydrochlorothiazide (PRINZIDE,ZESTORETIC) 10-12.5 MG tablet Take 1 tablet by mouth daily.    . nicotine (NICODERM CQ - DOSED IN MG/24 HOURS) 21 mg/24hr patch Place 21 mg onto the skin daily.    Marland Kitchen omeprazole (PRILOSEC) 20 MG capsule TAKE ONE CAPSULE BY MOUTH TWICE DAILY BEFORE  A  MEAL    . sildenafil (VIAGRA) 100 MG  tablet Take 100 mg by mouth daily as needed for erectile dysfunction.    . SYMBICORT 80-4.5 MCG/ACT inhaler Inhale 2 puffs into the lungs 2 (two) times daily.     Marland Kitchen umeclidinium bromide (INCRUSE ELLIPTA) 62.5 MCG/INH AEPB Inhale 1 puff into the lungs 2 (two) times daily.     Review of Systems PER HPI OTHERWISE ALL SYSTEMS ARE NEGATIVE.    Objective:   Physical Exam  Constitutional: He is oriented to person, place, and time. He appears well-developed and well-nourished. No distress.  HENT:  Head: Normocephalic and atraumatic.  Mouth/Throat: Oropharynx is clear and moist. No oropharyngeal exudate.  Eyes: Pupils are equal, round, and reactive to light. No scleral icterus.  Neck: Normal range of motion. Neck supple.  Cardiovascular: Normal rate, regular rhythm and normal heart sounds.   Pulmonary/Chest: Effort normal and breath sounds normal. No respiratory distress.  Abdominal: Soft. Bowel sounds are normal. He exhibits no distension. There is no tenderness.  Musculoskeletal: He exhibits no edema.  Lymphadenopathy:    He has no cervical adenopathy.  Neurological: He is alert and oriented to person, place, and time.  Psychiatric: He has a normal mood and affect.  Vitals reviewed.     Assessment & Plan:

## 2016-12-11 NOTE — Progress Notes (Signed)
ON RECALL  °

## 2016-12-11 NOTE — Patient Instructions (Addendum)
COMPLETE BARIUM SWALLOW.  ENTOCORT TAPER 9 MG DAILY FOR 1 MOS THEN 6 MG DAILY FOR 1 MOS THEN 3 MG DAILY FOR 1 MOS.  DRINK WATER TO KEEP YOUR URINE LIGHT YELLOW.  FOLLOW A HIGH FIBER DIET IF TOLERATED. AVOID ITEMS THAT CAUSE BLOATING & GAS.  FOLLOW UP IN 4 MOS.

## 2016-12-12 NOTE — Progress Notes (Signed)
CC'ED TO PCP 

## 2016-12-24 ENCOUNTER — Other Ambulatory Visit: Payer: Self-pay

## 2016-12-24 ENCOUNTER — Ambulatory Visit (HOSPITAL_COMMUNITY)
Admission: RE | Admit: 2016-12-24 | Discharge: 2016-12-24 | Disposition: A | Discharge status: Home / Self Care | Payer: Medicare Other | Source: Ambulatory Visit | Attending: Gastroenterology | Admitting: Gastroenterology

## 2016-12-24 DIAGNOSIS — R1310 Dysphagia, unspecified: Secondary | ICD-10-CM | POA: Diagnosis not present

## 2016-12-25 ENCOUNTER — Other Ambulatory Visit (HOSPITAL_COMMUNITY): Payer: Self-pay | Admitting: Specialist

## 2016-12-25 DIAGNOSIS — R1319 Other dysphagia: Secondary | ICD-10-CM

## 2016-12-28 ENCOUNTER — Telehealth: Payer: Self-pay | Admitting: Gastroenterology

## 2016-12-28 DIAGNOSIS — R1314 Dysphagia, pharyngoesophageal phase: Secondary | ICD-10-CM

## 2016-12-28 NOTE — Telephone Encounter (Signed)
PT NEEDS MBSS FOR SILENT ASPIRATION DETECTED ON BPE.

## 2016-12-30 ENCOUNTER — Encounter (HOSPITAL_COMMUNITY): Payer: Self-pay | Admitting: Speech Pathology

## 2016-12-30 ENCOUNTER — Ambulatory Visit (HOSPITAL_COMMUNITY)
Admission: RE | Admit: 2016-12-30 | Discharge: 2016-12-30 | Disposition: A | Discharge status: Home / Self Care | Payer: Medicare Other | Source: Ambulatory Visit | Attending: Gastroenterology | Admitting: Gastroenterology

## 2016-12-30 ENCOUNTER — Ambulatory Visit (HOSPITAL_COMMUNITY): Payer: Medicare Other | Attending: Gastroenterology | Admitting: Speech Pathology

## 2016-12-30 ENCOUNTER — Other Ambulatory Visit: Payer: Self-pay

## 2016-12-30 DIAGNOSIS — R1312 Dysphagia, oropharyngeal phase: Secondary | ICD-10-CM | POA: Diagnosis not present

## 2016-12-30 DIAGNOSIS — R1319 Other dysphagia: Secondary | ICD-10-CM | POA: Diagnosis present

## 2016-12-30 DIAGNOSIS — R1310 Dysphagia, unspecified: Secondary | ICD-10-CM

## 2016-12-30 NOTE — Therapy (Signed)
[place], [place], [address] Phone: [phone]   Fax:  [phone]  Modified Barium Swallow  Patient Details  Name: Garrett Klein MRN: [id number] Date of Birth: 03-24-1956 No Data Recorded  Encounter Date: 12/30/2016      End of Session - 12/30/16 2258    Visit Number 1   Number of Visits 2   Authorization Type [employer] Medicare   SLP Start Time 1207   SLP Stop Time  1300   SLP Time Calculation (min) 53 min   Activity Tolerance Patient tolerated treatment well      Past Medical History:  Diagnosis Date  . Chronic lower back pain   . Collagenous colitis [address] [place]  . COPD (chronic obstructive pulmonary disease) ([place])   . Essential hypertension   . Hyperlipidemia   . MRSA infection   . PUD (peptic ulcer disease)     Past Surgical History:  Procedure Laterality Date  . BACK SURGERY    . COLONOSCOPY  2001   Dr. Sharlett Iles, normal  . COLONOSCOPY  04/2013   [facility]: Fentanyl 200mg Danford Bad 10mg : sigmoid diverticulosis, normal terminal ileum, small internal hemorrhoids, random colon bx: collagenous colitis. next tcs 04/2018  . COLONOSCOPY  2011   [facility]: two polyps, sessile serrated adenoma, mild diverticulosis  . ELBOW BURSA SURGERY    . ESOPHAGEAL DILATION N/A 05/19/2015   Procedure: ESOPHAGEAL DILATION;  Surgeon: Danie Binder, MD;  [facility];  Service: Endoscopy;  Laterality: N/A;  . ESOPHAGOGASTRODUODENOSCOPY N/A 10/28/2014   SLF: 1. Esophagitis due to GERD. KOH neg. 2. Small hiatal hernia 3. Moderate erosive gastritis 4. RUQ pain due to large ulcer 5. [place] duodentis in the bulb.   . ESOPHAGOGASTRODUODENOSCOPY N/A 05/19/2015   SLF: 1. Mild distal esophagitis 2. Peptic stricture at the gastroesophageal junction 3. Mild non-erosive gastritis 4. Pseudo pylorus due to prior PUD.   Marland Kitchen JOINT REPLACEMENT     left hip surgery - January 26, 2014  . ROTATOR CUFF REPAIR     November 2014     There were no vitals filed for this visit.      Subjective Assessment - 12/30/16 2240    Subjective "I have trouble with cornbread and chicken."   Patient is accompained by: Family member  Spouse   Currently in Pain? No/denies             General - 12/30/16 2243      General Information   Date of Onset 12/25/16   HPI [name] is a 61 yo male who was referred by Dr. Barney Drain for MBSS due to BPE last week showing silent aspiration of contrast. Pt with subjective c/o difficulty swallowing cornbread, chicken, and hamburger. He also reports that he will occasionally cough up masticated meats 1-3 hours after meals. Pt was admitted to [facility] over Christmas with COPD exacerbation. He was placed on antibiotics and developed candida in oropharynx. He was accompanied to today's appointment by his wife.    Type of Study MBS-Modified Barium Swallow Study   Previous Swallow Assessment BPE last week showing silent aspiration of contrast x1   Diet Prior to this Study Regular;Thin liquids   Temperature Spikes Noted No   Respiratory Status Room air   History of Recent Intubation No   Behavior/Cognition Alert;Cooperative;Pleasant mood   Oral Cavity Assessment Within Functional Limits   Oral Care Completed by SLP No   Oral Cavity -  Dentition Dentures, top;Dentures, bottom   Vision Functional for self feeding   Self-Feeding Abilities Able to feed self   Patient Positioning Upright in chair   Baseline Vocal Quality Normal   Volitional Cough Strong   Volitional Swallow Able to elicit  delayed with repeat swallows   Anatomy Within functional limits   Pharyngeal Secretions Not observed secondary MBS            Oral Preparation/Oral Phase - 12/30/16 2247      Oral Preparation/Oral Phase   Oral Phase Impaired  mild oral pooling of liquids beneath dentures     Electrical stimulation - Oral Phase   Was Electrical Stimulation Used No          Pharyngeal Phase -  12/30/16 2255      Pharyngeal Phase   Pharyngeal Phase Impaired     Pharyngeal - Nectar   Pharyngeal- Nectar Cup Swallow initiation at vallecula;Reduced anterior laryngeal mobility;Reduced tongue base retraction;Pharyngeal residue - valleculae;Penetration/Aspiration before swallow;Penetration/Aspiration during swallow   Pharyngeal Material enters airway, remains ABOVE vocal cords then ejected out     Pharyngeal - Thin   Pharyngeal- Thin Cup Reduced laryngeal elevation;Reduced anterior laryngeal mobility;Reduced airway/laryngeal closure;Reduced tongue base retraction;Penetration/Aspiration during swallow;Penetration/Aspiration before swallow;Pharyngeal residue - valleculae   Pharyngeal Material enters airway, remains ABOVE vocal cords and not ejected out;Material does not enter airway;Material enters airway, remains ABOVE vocal cords then ejected out   Pharyngeal- Thin Straw Reduced anterior laryngeal mobility;Reduced laryngeal elevation;Reduced airway/laryngeal closure;Reduced tongue base retraction;Penetration/Aspiration before swallow;Penetration/Aspiration during swallow;Penetration/Apiration after swallow;Trace aspiration   Pharyngeal Material does not enter airway;Material enters airway, remains ABOVE vocal cords then ejected out;Material enters airway, remains ABOVE vocal cords and not ejected out;Material enters airway, passes BELOW cords then ejected out     Pharyngeal - Solids   Pharyngeal- Puree Within functional limits;Swallow initiation at vallecula   Pharyngeal- Mechanical Soft Swallow initiation at vallecula;Within functional limits   Pharyngeal- Pill Within functional limits  trace penetration of thins when taking pill     Electrical Stimulation - Pharyngeal Phase   Was Electrical Stimulation Used No          Cricopharyngeal Phase - 12/30/16 2255      Cervical Esophageal Phase   Cervical Esophageal Phase Within functional limits           Plan - 12/30/16 2258     Clinical Impression Statement Pt presents with mild oropharyngeal phase dysphagia characterized by premature spillage of liquids, decreased tongue base retraction, reduced hyolaryngeal excursion, and decreased laryngeal vestibule closure resulting in premature spillage of liquids (thin and nectar), penetration of thins and nectars before and during the swallow in trace/min amounts, and vallecular residue of liquids after the swallow. Pt was not sensate to penetration, but was able to clear penetration with SLP cue to cough/clear throat and dry swallow. Penetration occurred in greater amounts and with one episode of actual aspiration (underside of cords and then removed) with straw sips thin. Pt's epiglottis noted to fully invert and remain inverted against posterior pharyngeal wall for up to 8 seconds at times when swallowing thin and nectar-thick liquids (? If this is adaptive or protective in nature as it tended to occur when penetration occurred and during chin tuck maneuver trialed). Recommend self regulated regular textures and thin liquids via small sips with periodic cough/throat clear; no straws. Will see patient for one additional SLP session in outpatient setting to further review study with pt/spouse, reinforce strategies, monitor diet tolerance, provide further information  on solid textures, and provide exercises to try to increase hyolaryngeal excursion. Pt/spouse in agreement with plan of care. Will see pt in ~1 week.    Speech Therapy Frequency 1x /week   Duration 1 week   Treatment/Interventions Aspiration precaution training;SLP instruction and feedback;Compensatory strategies;Patient/family education   Potential to Achieve Goals Good   Consulted and Agree with Plan of Care Patient      Patient will benefit from skilled therapeutic intervention in order to improve the following deficits and impairments:   Dysphagia, oropharyngeal phase      G-Codes - 01-26-2017 2300    Functional  Assessment Tool Used MBSS; clinical judgment   Functional Limitations Swallowing   Swallow Current Status [id number]) At least 1 percent but less than 20 percent impaired, limited or restricted   Swallow Goal Status [id number]) At least 1 percent but less than 20 percent impaired, limited or restricted          Recommendations/Treatment - 01/26/17 2255      Swallow Evaluation Recommendations   Recommended Consults Consider ENT evaluation   SLP Diet Recommendations Age appropriate regular;Thin   Liquid Administration via Cup   Medication Administration Whole meds with liquid   Supervision Patient able to self feed   Compensations Small sips/bites;Clear throat intermittently   Postural Changes Seated upright at 90 degrees;Remain upright for at least 30 minutes after feeds/meals          Prognosis - 01/26/17 2257      Prognosis   Prognosis for Safe Diet Advancement Good     Individuals Consulted   Consulted and Agree with Results and Recommendations Patient;Family member/caregiver   Report Sent to  Referring physician      Problem List Patient Active Problem List   Diagnosis Date Noted  . Coronary artery calcification 08/09/2016  . Essential hypertension 08/09/2016  . Anal skin tag 08/23/2015  . Erosive esophagitis 05/05/2015  . PUD (peptic ulcer disease) 05/05/2015  . Chronic diarrhea 05/05/2015  . Esophageal dysphagia 05/05/2015  . HCAP (healthcare-associated pneumonia) 04/23/2014  . Arthritis 04/23/2014  . COPD (chronic obstructive pulmonary disease) ([place]) 04/23/2014  . Tobacco abuse 04/23/2014  . Hypercholesterolemia 04/23/2014  . Pneumococcal pneumonia ([place]) 04/23/2014  . Bursitis of elbow    Thank you,  Eliyahu Stanforth, CCC-SLP [phone]  [facility] 2017/01/26, 6:01 PM  [place] [address] [place], [place], [address] Phone: [phone]   Fax:  [phone]  Name: Garrett Klein MRN: [id number] Date of  Birth: 02-Nov-1956

## 2016-12-31 ENCOUNTER — Other Ambulatory Visit: Payer: Self-pay

## 2016-12-31 DIAGNOSIS — R1310 Dysphagia, unspecified: Secondary | ICD-10-CM

## 2017-01-01 ENCOUNTER — Telehealth: Payer: Self-pay | Admitting: Gastroenterology

## 2017-01-01 ENCOUNTER — Encounter: Payer: Self-pay | Admitting: Gastroenterology

## 2017-01-01 ENCOUNTER — Telehealth (HOSPITAL_COMMUNITY): Payer: Self-pay | Admitting: Internal Medicine

## 2017-01-01 NOTE — Telephone Encounter (Signed)
PATIENT RETURNED CALL AND WOULD LIKE YOU TO PLEASE CALL HIM BACK

## 2017-01-01 NOTE — Progress Notes (Signed)
LMOM to call.

## 2017-01-01 NOTE — Progress Notes (Signed)
Pt's wife is aware of recommendation and the referral.

## 2017-01-01 NOTE — Telephone Encounter (Signed)
01/01/17 left a message to schedule patient for a follow up visit in 1-2 weeks.

## 2017-01-02 NOTE — Telephone Encounter (Signed)
PT is aware of the ENT referral.

## 2017-01-06 ENCOUNTER — Ambulatory Visit (HOSPITAL_COMMUNITY): Payer: Medicare Other | Admitting: Speech Pathology

## 2017-01-06 ENCOUNTER — Ambulatory Visit (HOSPITAL_COMMUNITY): Payer: Medicare Other

## 2017-01-09 ENCOUNTER — Telehealth: Payer: Self-pay | Admitting: Gastroenterology

## 2017-01-09 NOTE — Telephone Encounter (Signed)
Please call pt. If he stops the meds he may relapse. He should reduce the dose to one pill a day UNTIL mar 24 IF POSSIBLE. HE SHOULD CONTINUE ENTOCORT AT 3 MG DAILY FOR AT LEAST 2 WEEKS IF HE THINKS HE CAN'T MAKE IT ANOTHER MONTH.

## 2017-01-09 NOTE — Telephone Encounter (Signed)
LMOM to call.

## 2017-01-09 NOTE — Telephone Encounter (Signed)
I called and spoke to pt's wife, Constance Holster. She said pt said he is sick and tired of feeling like he does. Headache, nausea, nervous feeling, and he thinks it is coming from the Entocort and he has been on it about a month.  His bowel movements are normal. He would like to know what Dr. Oneida Alar thinks could be causing him to feel like this.  Please advise!

## 2017-01-09 NOTE — Telephone Encounter (Signed)
PT's wife is aware.

## 2017-01-09 NOTE — Telephone Encounter (Signed)
Please call the patient, feeling fuzzy headed.  Thinks it may be due to medication he was prescribed here.  Is not aware of the name.     [phone]

## 2017-01-23 ENCOUNTER — Ambulatory Visit (INDEPENDENT_AMBULATORY_CARE_PROVIDER_SITE_OTHER): Payer: Medicare Other | Admitting: Otolaryngology

## 2017-01-23 DIAGNOSIS — R1312 Dysphagia, oropharyngeal phase: Secondary | ICD-10-CM | POA: Diagnosis not present

## 2017-01-30 ENCOUNTER — Telehealth: Payer: Self-pay | Admitting: Gastroenterology

## 2017-01-30 NOTE — Telephone Encounter (Signed)
Patient presented during his wife's appointment with questions regarding his budesonide. He is tapering off and wonders if he should have a refill. Currently on 3mg  daily. Advised to stop when current bottle completed. Call if recurrent diarrhea.

## 2017-02-04 ENCOUNTER — Telehealth: Payer: Self-pay | Admitting: Gastroenterology

## 2017-02-04 NOTE — Telephone Encounter (Signed)
Pt's wife Constance Holster called saying that the patient isn't doing well and he was aspirating and needed to be seen. I offered her OV with LSL for today at 11am but patient was still in bed and couldn't get here in time. I told her next available would be April 11th. Patient's wife would like to speak with DS. Please call her at [phone]

## 2017-02-04 NOTE — Telephone Encounter (Signed)
Tried to call and [facility] for a return call.

## 2017-02-05 NOTE — Telephone Encounter (Signed)
LMOM to call.

## 2017-02-06 ENCOUNTER — Encounter (HOSPITAL_COMMUNITY): Payer: Medicare Other

## 2017-02-06 ENCOUNTER — Encounter (HOSPITAL_COMMUNITY): Payer: Self-pay | Admitting: Adult Health

## 2017-02-06 ENCOUNTER — Encounter (HOSPITAL_COMMUNITY): Payer: Medicare Other | Attending: Adult Health | Admitting: Adult Health

## 2017-02-06 VITALS — BP 152/86 | HR 74 | Resp 16 | Ht 70.0 in | Wt 187.9 lb

## 2017-02-06 DIAGNOSIS — R531 Weakness: Secondary | ICD-10-CM

## 2017-02-06 DIAGNOSIS — Z720 Tobacco use: Secondary | ICD-10-CM | POA: Diagnosis not present

## 2017-02-06 DIAGNOSIS — D72829 Elevated white blood cell count, unspecified: Secondary | ICD-10-CM | POA: Insufficient documentation

## 2017-02-06 DIAGNOSIS — E876 Hypokalemia: Secondary | ICD-10-CM

## 2017-02-06 DIAGNOSIS — R5383 Other fatigue: Secondary | ICD-10-CM | POA: Diagnosis not present

## 2017-02-06 LAB — COMPREHENSIVE METABOLIC PANEL
ALT: 14 U/L — AB (ref 17–63)
AST: 21 U/L (ref 15–41)
Albumin: 4.1 g/dL (ref 3.5–5.0)
Alkaline Phosphatase: 77 U/L (ref 38–126)
Anion gap: 9 (ref 5–15)
BILIRUBIN TOTAL: 0.7 mg/dL (ref 0.3–1.2)
BUN: 12 mg/dL (ref 6–20)
CHLORIDE: 103 mmol/L (ref 101–111)
CO2: 25 mmol/L (ref 22–32)
CREATININE: 0.87 mg/dL (ref 0.61–1.24)
Calcium: 9.1 mg/dL (ref 8.9–10.3)
GFR calc Af Amer: >60 mL/min (ref >60)
Glucose, Bld: 103 mg/dL — ABNORMAL HIGH (ref 65–99)
Potassium: 3.4 mmol/L — ABNORMAL LOW (ref 3.5–5.1)
Sodium: 137 mmol/L (ref 135–145)
TOTAL PROTEIN: 6.8 g/dL (ref 6.5–8.1)

## 2017-02-06 LAB — CBC WITH DIFFERENTIAL/PLATELET
BASOS ABS: 0.1 10*3/uL (ref 0.0–0.1)
BASOS PCT: 1 %
EOS ABS: 0.1 10*3/uL (ref 0.0–0.7)
Eosinophils Relative: 1 %
HCT: 45.4 % (ref 39.0–52.0)
HEMOGLOBIN: 15.5 g/dL (ref 13.0–17.0)
Lymphocytes Relative: 19 %
Lymphs Abs: 1.9 10*3/uL (ref 0.7–4.0)
MCH: 30.2 pg (ref 26.0–34.0)
MCHC: 34.1 g/dL (ref 30.0–36.0)
MCV: 88.3 fL (ref 78.0–100.0)
MONOS PCT: 9 %
Monocytes Absolute: 0.9 10*3/uL (ref 0.1–1.0)
NEUTROS PCT: 70 %
Neutro Abs: 6.9 10*3/uL (ref 1.7–7.7)
Platelets: 343 10*3/uL (ref 150–400)
RBC: 5.14 MIL/uL (ref 4.22–5.81)
RDW: 14.2 % (ref 11.5–15.5)
WBC: 9.7 10*3/uL (ref 4.0–10.5)

## 2017-02-06 LAB — LACTATE DEHYDROGENASE: LDH: 152 U/L (ref 98–192)

## 2017-02-06 NOTE — Patient Instructions (Addendum)
[place] at [facility] Discharge Instructions  RECOMMENDATIONS MADE BY THE CONSULTANT AND ANY TEST RESULTS WILL BE SENT TO YOUR REFERRING PHYSICIAN.  You were seen today by Mike Craze NP No follow up with [place] needed, but call us if you should need [place] Follow up with your primary care physician   Thank you for choosing [place] at [facility] to provide your oncology and hematology care.  To afford each patient quality time with our provider, please arrive at least 15 minutes before your scheduled appointment time.    If you have a lab appointment with the [facility] please come in thru the  Main Entrance and check in at the main information desk  You need to re-schedule your appointment should you arrive 10 or more minutes late.  We strive to give you quality time with our providers, and arriving late affects you and other patients whose appointments are after yours.  Also, if you no show three or more times for appointments you may be dismissed from the clinic at the providers discretion.     Again, thank you for choosing [facility]  Our hope is that these requests will decrease the amount of time that you wait before being seen by our physicians.       _____________________________________________________________  Should you have questions after your visit to [facility], please contact our office at [phone] between the hours of 8:30 a.m. and 4:30 p.m.  Voicemails left after 4:30 p.m. will not be returned until the following business day.  For prescription refill requests, have your pharmacy contact our office.       Resources For Cancer Patients and their Caregivers ? American Cancer Society: Can assist with transportation, wigs, general needs, runs Look Good Feel Better.        678-589-3240 ? Cancer Care: Provides financial assistance, online support groups, medication/co-pay assistance.   1-800-813-HOPE 909 221 6006) ? [facility] Assists [place] cancer patients and their families through emotional , educational and financial support.  224-868-7405 ? [place] DSS Where to apply for food stamps, Medicaid and utility assistance. 306-042-5396 ? [facility]: Transportation to medical appointments. (301) 794-2333 ? Social Security Administration: May apply for disability if have a Stage IV cancer. 641-720-8833 (740)803-8021 ? [employer], Disability and Transit Services: Assists with nutrition, care and transit needs. [place] Support Programs: @10RELATIVEDAYS @ > Cancer Support Group  2nd Tuesday of the month 1pm-2pm, Journey Room  > Creative Journey  3rd Tuesday of the month 1130am-1pm, Journey Room  > Look Good Feel Better  1st Wednesday of the month 10am-12 noon, Journey Room (Call [facility] to register [phone])

## 2017-02-06 NOTE — Progress Notes (Addendum)
Garrett Klein,  [id number]   CLINIC:  Medical Oncology/Hematology  PCP:  [name], MD [address] [place] [address] [phone]   REASON FOR VISIT:  Follow-up for leukocytosis   CURRENT THERAPY: Observation    HISTORY OF PRESENT ILLNESS:  (From Dr. Donald Klein last note on 08/06/16)     INTERVAL HISTORY:  [name] 61 y.o. male returns for routine follow-up for leukocytosis. He is here today with his wife.    He reports he has felt weakness, fatigue, "foggy head", "and I just haven't felt good since December."  Of note, he was hospitalized at [facility] in December for reported aspiration pneumonia; he reports having thrush after that hospitalization.  He is seeing a speech therapist now. Denies recurrent fever/chills.  He has a PCP, but he and his wife may be looking for a new PCP soon.     REVIEW OF SYSTEMS:  Review of Systems  Constitutional: Positive for fatigue. Negative for chills and fever.  HENT:   Positive for trouble swallowing.   Eyes: Negative.   Respiratory: Negative.   Cardiovascular: Negative.   Gastrointestinal: Negative.  Negative for abdominal pain, blood in stool, constipation, diarrhea, nausea and vomiting.  Endocrine: Negative.   Genitourinary: Negative.  Negative for dysuria and hematuria.   Musculoskeletal: Negative.   Skin: Negative.  Negative for rash.  Hematological: Negative.      PAST MEDICAL/SURGICAL HISTORY:  Past Medical History:  Diagnosis Date  . Chronic lower back pain   . Collagenous colitis [address] [place]  . COPD (chronic obstructive pulmonary disease) ([place])   . Essential hypertension   . Hyperlipidemia   . MRSA infection   . PUD (peptic ulcer disease)    Past Surgical History:  Procedure Laterality Date  . BACK SURGERY    . COLONOSCOPY  2001   Dr. Sharlett Iles, normal  . COLONOSCOPY  04/2013   [facility]: Fentanyl 200mg Danford Bad 10mg : sigmoid diverticulosis, normal  terminal ileum, small internal hemorrhoids, random colon bx: collagenous colitis. next tcs 04/2018  . COLONOSCOPY  2011   [facility]: two polyps, sessile serrated adenoma, mild diverticulosis  . ELBOW BURSA SURGERY    . ESOPHAGEAL DILATION N/A 05/19/2015   Procedure: ESOPHAGEAL DILATION;  Surgeon: Danie Binder, MD;  [facility];  Service: Endoscopy;  Laterality: N/A;  . ESOPHAGOGASTRODUODENOSCOPY N/A 10/28/2014   SLF: 1. Esophagitis due to GERD. KOH neg. 2. Small hiatal hernia 3. Moderate erosive gastritis 4. RUQ pain due to large ulcer 5. [place] duodentis in the bulb.   . ESOPHAGOGASTRODUODENOSCOPY N/A 05/19/2015   SLF: 1. Mild distal esophagitis 2. Peptic stricture at the gastroesophageal junction 3. Mild non-erosive gastritis 4. Pseudo pylorus due to prior PUD.   Marland Kitchen JOINT REPLACEMENT     left hip surgery - January 26, 2014  . ROTATOR CUFF REPAIR     November 2014     SOCIAL HISTORY:  Social History   Social History  . Marital status: Married    Spouse name: N/A  . Number of children: N/A  . Years of education: N/A   Occupational History  . Not on file.   Social History Main Topics  . Smoking status: Current Every Day Smoker    Packs/day: 1.00    Years: 40.00    Types: Cigarettes    Start date: 01/09/1969  . Smokeless tobacco: Never Used  . Alcohol use No  . Drug use: No  . Sexual  activity: Not on file   Other Topics Concern  . Not on file   Social History Narrative  . No narrative on file    FAMILY HISTORY:  Family History  Problem Relation Age of Onset  . Cancer Mother   . Diabetes type II Father   . Heart attack Father   . Arthritis Sister   . Stroke Brother   . Coronary artery disease Brother   . Diabetes Other   . Heart attack    . Colon cancer Neg Hx   . Liver disease Neg Hx   . Ulcers Neg Hx     CURRENT MEDICATIONS:  Outpatient Encounter Prescriptions as of 02/06/2017  Medication Sig  . albuterol (PROVENTIL HFA;VENTOLIN HFA) 108 (90 BASE)  MCG/ACT inhaler Inhale 2 puffs into the lungs every 6 (six) hours as needed for wheezing or shortness of breath.  Marland Kitchen albuterol (PROVENTIL) (2.5 MG/3ML) 0.083% nebulizer solution Take 2.5 mg by nebulization every 6 (six) hours as needed for wheezing or shortness of breath.  Marland Kitchen atorvastatin (LIPITOR) 80 MG tablet Take 40 mg by mouth every evening.   . budesonide (ENTOCORT EC) 3 MG 24 hr capsule 3 PO DAILY FOR 1 MOS, THEN 2 PO DAILY FOR 1 MO THEN 1 PO DAILY FOR ONE MO  . cholecalciferol (VITAMIN D) 1000 UNITS tablet Take 1,000 Units by mouth 4 (four) times daily.   Marland Kitchen gabapentin (NEURONTIN) 400 MG capsule Take 400 mg by mouth 3 (three) times daily.  Marland Kitchen lisinopril-hydrochlorothiazide (PRINZIDE,ZESTORETIC) 10-12.5 MG tablet Take 1 tablet by mouth daily.  . nicotine (NICODERM CQ - DOSED IN MG/24 HOURS) 21 mg/24hr patch Place 21 mg onto the skin daily.  Marland Kitchen omeprazole (PRILOSEC) 20 MG capsule TAKE ONE CAPSULE BY MOUTH TWICE DAILY BEFORE  A  MEAL  . sildenafil (VIAGRA) 100 MG tablet Take 100 mg by mouth daily as needed for erectile dysfunction.  . SYMBICORT 80-4.5 MCG/ACT inhaler Inhale 2 puffs into the lungs 2 (two) times daily.   Marland Kitchen umeclidinium bromide (INCRUSE ELLIPTA) 62.5 MCG/INH AEPB Inhale 1 puff into the lungs 2 (two) times daily.  Marland Kitchen EPINEPHrine 0.3 mg/0.3 mL IJ SOAJ injection Inject 0.3 mg into the muscle as needed (allergic reaction).   No facility-administered encounter medications on file as of 02/06/2017.     ALLERGIES:  Allergies  Allergen Reactions  . Bee Venom Swelling  . Ciprofloxacin Other (See Comments)    Turns red from the chest up.  Marland Kitchen Hydrocodone     Rash   . Levaquin [Levofloxacin In D5w]     CANDIDA ALL OVER MOUTH TO PENIS  . Penicillins Other (See Comments)    Reaction in childhood, caused patient to pass out  . Tramadol     Hives   . Sulfa Antibiotics Rash     PHYSICAL EXAM:  ECOG Performance status: 1 - Symptomatic, but independent   Vitals:   02/06/17 1346  BP:  (!) 152/86  Pulse: 74  Resp: 16   Filed Weights   02/06/17 1346  Weight: 187 lb 14.4 oz (85.2 kg)    Physical Exam  Constitutional: He is oriented to person, place, and time and well-developed, well-nourished, and in no distress.  HENT:  Head: Normocephalic.  Mouth/Throat: No oropharyngeal exudate.  Posterior oropharynx with mild erythema likely secondary to cigarette smoking.  Eyes: Conjunctivae are normal. Pupils are equal, round, and reactive to light. No scleral icterus.  Neck: Normal range of motion. Neck supple.  Cardiovascular: Normal rate, regular rhythm and  normal heart sounds.   Pulmonary/Chest: Effort normal and breath sounds normal. No respiratory distress. He has no wheezes.  Abdominal: Soft. Bowel sounds are normal. There is no tenderness. There is no rebound and no guarding.  Musculoskeletal: Normal range of motion. He exhibits no edema.  Lymphadenopathy:    He has no cervical adenopathy.       Right: No supraclavicular adenopathy present.       Left: No supraclavicular adenopathy present.  Neurological: He is alert and oriented to person, place, and time. No cranial nerve deficit. Gait normal.  Skin: Skin is warm and dry. No rash noted.  Psychiatric: Mood, memory, affect and judgment normal.  Nursing note and vitals reviewed.    LABORATORY DATA:  I have reviewed the labs as listed.  CBC    Component Value Date/Time   WBC 9.7 02/06/2017 1250   RBC 5.14 02/06/2017 1250   HGB 15.5 02/06/2017 1250   HCT 45.4 02/06/2017 1250   PLT 343 02/06/2017 1250   MCV 88.3 02/06/2017 1250   MCH 30.2 02/06/2017 1250   MCHC 34.1 02/06/2017 1250   RDW 14.2 02/06/2017 1250   LYMPHSABS 1.9 02/06/2017 1250   MONOABS 0.9 02/06/2017 1250   EOSABS 0.1 02/06/2017 1250   BASOSABS 0.1 02/06/2017 1250   CMP Latest Ref Rng & Units 02/06/2017 08/06/2016 05/06/2016  Glucose 65 - 99 mg/dL 103(H) 109(H) 129(H)  BUN 6 - 20 mg/dL 12 9 11   Creatinine 0.61 - 1.24 mg/dL 0.87 0.70 0.91    Sodium 135 - 145 mmol/L 137 141 136  Potassium 3.5 - 5.1 mmol/L 3.4(L) 3.8 4.0  Chloride 101 - 111 mmol/L 103 105 103  CO2 22 - 32 mmol/L 25 26 27   Calcium 8.9 - 10.3 mg/dL 9.1 9.1 8.9  Total Protein 6.5 - 8.1 g/dL 6.8 - 6.8  Total Bilirubin 0.3 - 1.2 mg/dL 0.7 - 0.4  Alkaline Phos 38 - 126 U/L 77 - 70  AST 15 - 41 U/L 21 - 16  ALT 17 - 63 U/L 14(L) - 12(L)   Results for ORLYN, ODONOGHUE (MRN [id number])   Ref. Range 02/06/2017 12:50  LDH Latest Ref Range: 98 - 192 U/L 152   PENDING LABS:    DIAGNOSTIC IMAGING:    PATHOLOGY:     ASSESSMENT & PLAN:   Leukocytosis, resolved:  -Labs reviewed in extensive detail with patient today. Copy of labs were provided to patient and his wife today.  -CBC with diff is normal today and has shown stability for >6 months.  Vital signs stalbe & afebrile; clinically, he does not appear to be infected.  We discussed that his previous leukocytosis/neutrophilia was likely secondary to steroids vs inflammation from COPD/smoking.  -Based on Dr. Donald Klein previous evaluation and the stability of his blood counts, we recommend he be discharged from follow-up here at the cancer center.  Of course, he is welcome to follow-up at any time in the future with any additional concerns.  He voiced understanding and appreciation.   Fatigue/Weakness:  -Recommended he follow-up with his PCP for further evaluation/work-up; referral to other specialists as needed.   Mild hypokalemia:  -Potassium 3.4 today. Encouraged him to eat more potassium-rich foods.   Smoking cessation/Lung cancer screening:  -Advised smoking cessation.  -Recommended he follow-up with PCP to consider lung cancer screening with low-dose CT chest.      Dispo:  -No follow-up necessary. He is welcome to return in the future, as needed.    All questions  were answered to patient's stated satisfaction. Encouraged patient to call with any new concerns or questions.    A total of 30 minutes was  spent in face-to-face care of this patient, with greater than 50% of that time spent in counseling and care-coordination.    Orders placed this encounter:  No orders of the defined types were placed in this encounter.     Mike Craze, NP [place] [phone]

## 2017-02-17 ENCOUNTER — Encounter: Payer: Self-pay | Admitting: Gastroenterology

## 2017-02-24 ENCOUNTER — Encounter (HOSPITAL_COMMUNITY): Payer: Self-pay | Admitting: Speech Pathology

## 2017-02-24 ENCOUNTER — Ambulatory Visit (HOSPITAL_COMMUNITY): Payer: Medicare Other | Attending: Gastroenterology | Admitting: Speech Pathology

## 2017-02-24 DIAGNOSIS — R1312 Dysphagia, oropharyngeal phase: Secondary | ICD-10-CM

## 2017-02-24 NOTE — Therapy (Signed)
[place], [place], [address] Phone: [phone]   Fax:  [phone]  Speech Language Pathology Treatment  Patient Details  Name: Garrett Klein MRN: [id number] Date of Birth: 11-25-1955 No Data Recorded  Encounter Date: 02/24/2017      End of Session - 02/24/17 1704    Visit Number 2   Number of Visits 2   Authorization Type [employer] Medicare   SLP Start Time 6269   SLP Stop Time  1520   SLP Time Calculation (min) 47 min   Activity Tolerance Patient tolerated treatment well      Past Medical History:  Diagnosis Date  . Chronic lower back pain   . Collagenous colitis [address] [place]  . COPD (chronic obstructive pulmonary disease) ([place])   . Essential hypertension   . Hyperlipidemia   . MRSA infection   . PUD (peptic ulcer disease)     Past Surgical History:  Procedure Laterality Date  . BACK SURGERY    . COLONOSCOPY  2001   Dr. Sharlett Iles, normal  . COLONOSCOPY  04/2013   [facility]: Fentanyl 200mg Danford Bad 10mg : sigmoid diverticulosis, normal terminal ileum, small internal hemorrhoids, random colon bx: collagenous colitis. next tcs 04/2018  . COLONOSCOPY  2011   [facility]: two polyps, sessile serrated adenoma, mild diverticulosis  . ELBOW BURSA SURGERY    . ESOPHAGEAL DILATION N/A 05/19/2015   Procedure: ESOPHAGEAL DILATION;  Surgeon: Danie Binder, MD;  Location[facility];  Service: Endoscopy;  Laterality: N/A;  . ESOPHAGOGASTRODUODENOSCOPY N/A 10/28/2014   SLF: 1. Esophagitis due to GERD. KOH neg. 2. Small hiatal hernia 3. Moderate erosive gastritis 4. RUQ pain due to large ulcer 5. [place] duodentis in the bulb.   . ESOPHAGOGASTRODUODENOSCOPY N/A 05/19/2015   SLF: 1. Mild distal esophagitis 2. Peptic stricture at the gastroesophageal junction 3. Mild non-erosive gastritis 4. Pseudo pylorus due to prior PUD.   Marland Kitchen JOINT REPLACEMENT     left hip surgery - January 26, 2014  . ROTATOR CUFF REPAIR     November 2014     There were no vitals filed for this visit.      Subjective Assessment - 02/24/17 1544    Subjective "I got choked eating deer venison and chicken nuggets."   Patient is accompained by: Family member  Wife   Currently in Pain? No/denies          ADULT SLP TREATMENT - 02/24/17 0001      General Information   Behavior/Cognition Alert;Cooperative;Pleasant mood   Patient Positioning Upright in chair   Oral care provided N/A   HPI [name] is a 61 yo male who was referred by Dr. Barney Drain for MBSS due to BPE last week showing silent aspiration of contrast. Pt with subjective c/o difficulty swallowing cornbread, chicken, and hamburger. He also reports that he will occasionally cough up masticated meats 1-3 hours after meals. Pt was admitted to [facility] over Christmas with COPD exacerbation. He was placed on antibiotics and developed candida in oropharynx. MBSS was completed 12/30/2016 with recommendation for self regulated regular textures and thin liquids (small bites/sips, clear throat intermittently, reflux precautions). He was accompanied to today's appointment by his wife.      Treatment Provided   Treatment provided Dysphagia     Dysphagia Treatment   Temperature Spikes Noted No   Respiratory Status Room air   Oral Cavity - Dentition Dentures, top;Dentures, bottom   Treatment Methods  Skilled observation;Compensation strategy training;Patient/caregiver education   Patient observed directly with PO's Yes   Type of PO's observed Thin liquids   Feeding Able to feed self   Liquids provided via Cup   Pharyngeal Phase Signs & Symptoms Multiple swallows   Type of cueing Verbal   Amount of cueing Modified independent     Pain Assessment   Pain Assessment No/denies pain     MBSS from February 2018: Pt presents with mild oropharyngeal phase dysphagia characterized by premature spillage of liquids, decreased tongue base retraction, reduced hyolaryngeal excursion, and  decreased laryngeal vestibule closure resulting in premature spillage of liquids (thin and nectar), penetration of thins and nectars before and during the swallow in trace/min amounts, and vallecular residue of liquids after the swallow. Pt was not sensate to penetration, but was able to clear penetration with SLP cue to cough/clear throat and dry swallow. Penetration occurred in greater amounts and with one episode of actual aspiration (underside of cords and then removed) with straw sips thin. Pt's epiglottis noted to fully invert and remain inverted against posterior pharyngeal wall for up to 8 seconds at times when swallowing thin and nectar-thick liquids (? If this is adaptive or protective in nature as it tended to occur when penetration occurred and during chin tuck maneuver trialed). Recommend self regulated regular textures and thin liquids via small sips with periodic cough/throat clear; no straws. Will see patient for one additional SLP session in outpatient setting to further review study with pt/spouse, reinforce strategies, monitor diet tolerance, provide further information on solid textures, and provide exercises to try to increase hyolaryngeal excursion. Pt/spouse in agreement with plan of care. Will see pt in ~1 week.       Plan - 02/24/17 1705    Clinical Impression Statement Pt was seen for MBSS in early February (see above) due to aspiration of thins noted on barium swallow. Pt tells SLP today that he has choked 3x on meats (venison sausage, bacon, and chicken nuggets) since MBSS, but that he has not choked since March 1. When this occurs, he reports that he sticks his finger down his throat to vomit and remove obstruction. He points to his sternum as source of pain when this occurs. These episodes have occurred both while wearing his dentures and without. The Pt and his wife report that Pt "just isn't right" and hasn't been since hospitalized in December. His wife worries that he is  depressed and they both worry about his cognition. His wife also reports suspected moments of apnea at night while sleeping. MBSS was again reviewed with Pt and spouse and recommendations were reinforced (oral care, small sips, clear throat, repeat swallow, and no straws). Pt reports following recommendations, however his wife reports reminders for him to clear his throat. Pt/wife also report visit to Dr. Thea Gist, but no records sent. They report that visit was unremarkable.  If Pt's primary complaints seem to be related to difficulty swallowing meats and breads, consider EGD as this was done in 2016 and Pt reported improvement in symptoms following the procedure. SLP will reach out to Dr. Oneida Alar (he says he will have an appointment with her soon).    Speech Therapy Frequency 1x /week   Duration 1 week   Treatment/Interventions Aspiration precaution training;SLP instruction and feedback;Compensatory strategies;Patient/family education   Potential to Achieve Goals Good   Consulted and Agree with Plan of Care Patient      Patient will benefit from skilled therapeutic intervention in order to  improve the following deficits and impairments:   Dysphagia, oropharyngeal phase      G-Codes - 15-Mar-2017 1705    Functional Assessment Tool Used MBSS; clinical judgment   Functional Limitations Swallowing   Swallow Goal Status (V2919) At least 1 percent but less than 20 percent impaired, limited or restricted   Swallow Discharge Status [phone]) At least 1 percent but less than 20 percent impaired, limited or restricted      Problem List Patient Active Problem List   Diagnosis Date Noted  . Coronary artery calcification 08/09/2016  . Essential hypertension 08/09/2016  . Anal skin tag 08/23/2015  . Erosive esophagitis 05/05/2015  . PUD (peptic ulcer disease) 05/05/2015  . Chronic diarrhea 05/05/2015  . Esophageal dysphagia 05/05/2015  . HCAP (healthcare-associated pneumonia) 04/23/2014  . Arthritis  04/23/2014  . COPD (chronic obstructive pulmonary disease) ([place]) 04/23/2014  . Tobacco abuse 04/23/2014  . Hypercholesterolemia 04/23/2014  . Pneumococcal pneumonia ([place]) 04/23/2014  . Bursitis of elbow    Thank you,  Niam Nepomuceno, [place]  [facility] 03/15/2017, 5:06 PM  [place] [address] [place], [place], [address] Phone: [phone]   Fax:  [phone]   Name: Garrett Klein MRN: [id number] Date of Birth: 16-Jun-1956

## 2017-02-25 ENCOUNTER — Telehealth: Payer: Self-pay | Admitting: Gastroenterology

## 2017-02-25 NOTE — Telephone Encounter (Signed)
PATIENT SCHEDULED FOR OV WITH [facility] IN MAY, WIFE CALLED AND STATED THAT THE SPEECH THERAPIST MENTIONED AND EGD, IS THAT SOMETHING THAT CAN BE SCHEDULED BEFORE HIS OV WITH SLF?

## 2017-02-25 NOTE — Telephone Encounter (Signed)
Please advise 

## 2017-03-05 ENCOUNTER — Telehealth: Payer: Self-pay | Admitting: Gastroenterology

## 2017-03-05 NOTE — Telephone Encounter (Signed)
I spoke with the nursing staff and was asked to put the patient in an urgent appt slot to see SLF tomorrow at 11:30.  Pt is aware of appt.

## 2017-03-05 NOTE — Telephone Encounter (Signed)
Patient stated that the speech pathologist needs to have a barium swallow with meat and another EGD.  He's been having difficulty swallowing and pain in his chest after he eats.

## 2017-03-05 NOTE — Telephone Encounter (Signed)
[phone]  PATIENT WIFE CALLED AND STATED THAT HE IS STILL HAVING DIARRHEA.  PLEASE CALL

## 2017-03-05 NOTE — Telephone Encounter (Signed)
Patient is coming in to see SLF urgently tomorrow for trouble swallowing.

## 2017-03-06 ENCOUNTER — Other Ambulatory Visit: Payer: Self-pay

## 2017-03-06 ENCOUNTER — Encounter: Payer: Self-pay | Admitting: Gastroenterology

## 2017-03-06 ENCOUNTER — Ambulatory Visit (INDEPENDENT_AMBULATORY_CARE_PROVIDER_SITE_OTHER): Payer: Medicare Other | Admitting: Gastroenterology

## 2017-03-06 DIAGNOSIS — K52831 Collagenous colitis: Secondary | ICD-10-CM | POA: Diagnosis not present

## 2017-03-06 DIAGNOSIS — R1310 Dysphagia, unspecified: Secondary | ICD-10-CM | POA: Diagnosis not present

## 2017-03-06 DIAGNOSIS — R1319 Other dysphagia: Secondary | ICD-10-CM

## 2017-03-06 MED ORDER — BUDESONIDE 3 MG PO CPEP: ORAL_CAPSULE | ORAL | 4 refills | Status: DC

## 2017-03-06 NOTE — Progress Notes (Signed)
cc'ed to pcp °

## 2017-03-06 NOTE — Progress Notes (Signed)
Subjective:    Patient ID: Garrett Klein, male    DOB: Mar 25, 1956, 61 y.o.   MRN: [id number]  [name], MD  HPI HAVING TROUBLE SWALLOWING SOLIDS. CAN EAT AND THREE HOURS LATER AND HE COUGHS AND A PIECE OF MEAT IN THE BACK OF HIS THROAT. LAST TIME STRETCHED HE FELT BETTER. STILL SMOKING 1 PK/DAY. HEARTBURN; NOT REALLY. FEELS DULL PAIN IN CHEST(MIDDLE). TOOK XANAX AND NOW HE'S OFF. MAY BE DEPRESSED. WAS ON ENTOCORT. STOOL  WAS HARD AND NOW HIS DIARRHEA IS BACK (UP TO 4 IN THE AM)-2-3 WEEKS AGO. IF HE EATS HE'S GONNA HAVE TO GO TO THE BATHROOM.   PT DENIES FEVER, CHILLS, HEMATOCHEZIA, HEMATEMESIS, nausea, vomiting, melena, diarrhea, CHEST PAIN, SHORTNESS OF BREATH,  CHANGE IN BOWEL IN HABITS, constipation, abdominal pain, problems with sedation, OR heartburn or indigestion.  Past Medical History:  Diagnosis Date  . Chronic lower back pain   . Collagenous colitis [address] [place]  . COPD (chronic obstructive pulmonary disease) ([place])   . Essential hypertension   . Hyperlipidemia   . MRSA infection   . PUD (peptic ulcer disease)    Past Surgical History:  Procedure Laterality Date  . BACK SURGERY    . COLONOSCOPY  2001   Dr. Sharlett Iles, normal  . COLONOSCOPY  04/2013   [facility]: Fentanyl 200mg Danford Bad 10mg : sigmoid diverticulosis, normal terminal ileum, small internal hemorrhoids, random colon bx: collagenous colitis. next tcs 04/2018  . COLONOSCOPY  2011   [facility]: two polyps, sessile serrated adenoma, mild diverticulosis  . ELBOW BURSA SURGERY    . ESOPHAGEAL DILATION N/A 05/19/2015   Procedure: ESOPHAGEAL DILATION;  Surgeon: Danie Binder, MD;  Location: [facility];  Service: Endoscopy;  Laterality: N/A;  . ESOPHAGOGASTRODUODENOSCOPY N/A 10/28/2014   SLF: 1. Esophagitis due to GERD. KOH neg. 2. Small hiatal hernia 3. Moderate erosive gastritis 4. RUQ pain due to large ulcer 5. [place]vin duodentis in the bulb.   . ESOPHAGOGASTRODUODENOSCOPY N/A 05/19/2015   SLF: 1. Mild distal  esophagitis 2. Peptic stricture at the gastroesophageal junction 3. Mild non-erosive gastritis 4. Pseudo pylorus due to prior PUD.   Marland Kitchen JOINT REPLACEMENT     left hip surgery - January 26, 2014  . ROTATOR CUFF REPAIR     November 2014   Allergies  Allergen Reactions  . Bee Venom Swelling  . Ciprofloxacin Other (See Comments)    Turns red from the chest up.  Marland Kitchen Hydrocodone     Rash   . Levaquin [Levofloxacin In D5w]     CANDIDA ALL OVER MOUTH TO PENIS  . Penicillins Other (See Comments)    Reaction in childhood, caused patient to pass out  . Tramadol     Hives   . Sulfa Antibiotics Rash   Current Outpatient Prescriptions  Medication Sig Dispense Refill  . albuterol (PROVENTIL HFA;VENTOLIN HFA) 108 (90 BASE) MCG/ACT inhaler Inhale 2 puffs into the lungs every 6 (six) hours as needed for wheezing or shortness of breath.    Marland Kitchen albuterol (PROVENTIL) (2.5 MG/3ML) 0.083% nebulizer solution Take 2.5 mg by nebulization every 6 (six) hours as needed for wheezing or shortness of breath.    Marland Kitchen atorvastatin (LIPITOR) 80 MG tablet Take 40 mg by mouth every evening.     .      . cholecalciferol (VITAMIN D) 1000 UNITS tablet Take 1,000 Units by mouth 4 (four) times daily.     Marland Kitchen EPINEPHrine 0.3 mg/0.3 mL IJ SOAJ injection Inject 0.3  mg into the muscle as needed (allergic reaction).    . gabapentin (NEURONTIN) 400 MG capsule Take 400 mg by mouth 3 (three) times daily.    Marland Kitchen lisinopril-hydrochlorothiazide (PRINZIDE,ZESTORETIC) 10-12.5 MG tablet Take 1 tablet by mouth daily.    . nicotine (NICODERM CQ - DOSED IN MG/24 HOURS) 21 mg/24hr patch Place 21 mg onto the skin daily.    Marland Kitchen omeprazole (PRILOSEC) 20 MG capsule TAKE ONE CAPSULE BY MOUTH TWICE DAILY BEFORE  A  MEAL    . sildenafil (VIAGRA) 100 MG tablet Take 100 mg by mouth daily as needed for erectile dysfunction.    . SYMBICORT 80-4.5 MCG/ACT inhaler Inhale 2 puffs into the lungs 2 (two) times daily.     Marland Kitchen umeclidinium bromide (INCRUSE ELLIPTA) 62.5  MCG/INH AEPB Inhale 1 puff into the lungs 2 (two) times daily.     Review of Systems PER HPI OTHERWISE ALL SYSTEMS ARE NEGATIVE.    Objective:   Physical Exam  Constitutional: He is oriented to person, place, and time. He appears well-developed and well-nourished. No distress.  HENT:  Head: Normocephalic and atraumatic.  Mouth/Throat: Oropharynx is clear and moist. No oropharyngeal exudate.  Eyes: Pupils are equal, round, and reactive to light. No scleral icterus.  Neck: Normal range of motion. Neck supple.  Cardiovascular: Normal rate, regular rhythm and normal heart sounds.   Pulmonary/Chest: Effort normal and breath sounds normal. No respiratory distress.  Abdominal: Soft. Bowel sounds are normal. He exhibits no distension. There is no tenderness.  Musculoskeletal: He exhibits no edema.  Lymphadenopathy:    He has no cervical adenopathy.  Neurological: He is alert and oriented to person, place, and time.  NO FOCAL DEFICITS  Psychiatric:  FLAT AFFECT, ANXIOUS MOOD  Vitals reviewed.     Assessment & Plan:

## 2017-03-06 NOTE — Patient Instructions (Signed)
FOLLOW A SOFT MECHANICAL DIET.  MEATS SHOULD BE CHOPPED OR GROUND ONLY. DO NOT EAT CHUNKS OF ANYTHING. SEE INFO BELOW.  Complete endoscopy to stretch your esophagus.  MEDS THAT CAN TRIGGER COLLAGENOUS colitis ARE ZANTAC, ASPIRIN, AND NSAIDS. You need BUDESONIDE 6 MG DAILY FOR 4 WEEKS THEN 3 MG daily for 5 mos. THE GOAL IS LESS THAN 3 STOOL DAILY AND < 1 WATERY STOOL DAILY.   FOLLOW UP IN 1 MOS.   SOFT MECHANICAL DIET This SOFT MECHANICAL DIET is restricted to:  Foods that are moist, soft-textured, and easy to chew and swallow.   Meats that are ground or are minced no larger than one-quarter inch pieces. Meats are moist with gravy or sauce added.   Foods that do not include bread or bread-like textures except soft pancakes, well-moistened with syrup or sauce.   Textures with some chewing ability required.   Casseroles without rice.   Cooked vegetables that are less than half an inch in size and easily mashed with a fork. No cooked corn, peas, broccoli, cauliflower, cabbage, Brussels sprouts, asparagus, or other fibrous, non-tender or rubbery cooked vegetables.   Canned fruit except for pineapple. Fruit must be cut into pieces no larger than half an inch in size.   Foods that do not include nuts, seeds, coconut, or sticky textures.   FOOD TEXTURES FOR DYSPHAGIA DIET LEVEL 2 -SOFT MECHANICAL DIET (includes all foods on Dysphagia Diet Level 1 - Pureed, in addition to the foods listed below)  FOOD GROUP: Breads. RECOMMENDED: Soft pancakes, well-moistened with syrup or sauce.  AVOID: All others.  FOOD GROUP: Cereals.  RECOMMENDED: Cooked cereals with little texture, including oatmeal. Unprocessed wheat bran stirred into cereals for bulk. Note: If thin liquids are restricted, it is important that all of the liquid is absorbed into the cereal.  AVOID: All dry cereals and any cooked cereals that may contain flax seeds or other seeds or nuts. Whole-grain, dry, or coarse cereals. Cereals  with nuts, seeds, dried fruit, and/or coconut.  FOOD GROUP: Desserts. RECOMMENDED: Pudding, custard. Soft fruit pies with bottom crust only. Canned fruit (excluding pineapple). Soft, moist cakes with icing.Frozen malts, milk shakes, frozen yogurt, eggnog, nutritional supplements, ice cream, sherbet, regular or sugar-free gelatin, or any foods that become thin liquid at either room (70 F) or body temperature (98 F).  AVOID: Dry, coarse cakes and cookies. Anything with nuts, seeds, coconut, pineapple, or dried fruit. Breakfast yogurt with nuts. Rice or bread pudding.  FOOD GROUP: Fats. RECOMMENDED: Butter, margarine, cream for cereal (depending on liquid consistency recommendations), gravy, cream sauces, sour cream, sour cream dips with soft additives, mayonnaise, salad dressings, cream cheese, cream cheese spreads with soft additives, whipped toppings.  AVOID: All fats with coarse or chunky additives.  FOOD GROUP: Fruits. RECOMMENDED: Soft drained, canned, or cooked fruits without seeds or skin. Fresh soft and ripe banana. Fruit juices with a small amount of pulp. If thin liquids are restricted, fruit juices should be thickened to appropriate consistency.  AVOID: Fresh or frozen fruits. Cooked fruit with skin or seeds. Dried fruits. Fresh, canned, or cooked pineapple.  FOOD GROUP: Meats and Meat Substitutes. (Meat pieces should not exceed 1/4 of an inch cube and should be tender.) RECOMMENDED: Moistened ground or cooked meat, poultry, or fish. Moist ground or tender meat may be served with gravy or sauce. Casseroles without rice. Moist macaroni and cheese, well-cooked pasta with meat sauce, tuna noodle casserole, soft, moist lasagna. Moist meatballs, meatloaf, or fish loaf. Protein  salads, such as tuna or egg without large chunks, celery, or onion. Cottage cheese, smooth quiche without large chunks. Poached, scrambled, or soft-cooked eggs (egg yolks should not be "runny" but should be moist and  able to be mashed with butter, margarine, or other moisture added to them). (Cook eggs to 160 F or use pasteurized eggs for safety.) Souffls may have small, soft chunks. Tofu. Well-cooked, slightly mashed, moist legumes, such as baked beans. All meats or protein substitutes should be served with sauces or moistened to help maintain cohesiveness in the oral cavity.  AVOID: Dry meats, tough meats (such as bacon, sausage, hot dogs, bratwurst). Dry casseroles or casseroles with rice or large chunks. Peanut butter. Cheese slices and cubes. Hard-cooked or crisp fried eggs. Sandwiches.Pizza.  FOOD GROUP: Potatoes and Starches. RECOMMENDED: Well-cooked, moistened, boiled, baked, or mashed potatoes. Well-cooked shredded hash brown potatoes that are not crisp. (All potatoes need to be moist and in sauces.)Well-cooked noodles in sauce. Spaetzel or soft dumplings that have been moistened with butter or gravy.  AVOID: Potato skins and chips. Fried or French-fried potatoes. Rice.  FOOD GROUP: Soups. RECOMMENDED: Soups with easy-to-chew or easy-to-swallow meats or vegetables: Particle sizes in soups should be less than 1/2 inch. Soups will need to be thickened to appropriate consistency if soup is thinner than prescribed liquid consistency.  AVOID: Soups with large chunks of meat and vegetables. Soups with rice, corn, peas.  FOOD GROUP: Vegetables. RECOMMENDED: All soft, well-cooked vegetables. Vegetables should be less than a half inch. Should be easily mashed with a fork.  AVOID: Cooked corn and peas. Broccoli, cabbage, Brussels sprouts, asparagus, or other fibrous, non-tender or rubbery cooked vegetables.  FOOD GROUP: Miscellaneous. RECOMMENDED: Jams and preserves without seeds, jelly. Sauces, salsas, etc., that may have small tender chunks less than 1/2 inch. Soft, smooth chocolate bars that are easily chewed.  AVOID: Seeds, nuts, coconut, or sticky foods. Chewy candies such as caramels or  licorice.

## 2017-03-06 NOTE — Assessment & Plan Note (Signed)
SYMPTOMS NOT CONTROLLED after stopping ENTOCORT. 9 MG DAILY CAUSED CONSTIPATION.  MEDS THAT CAN TRIGGER COLLAGENOUS colitis ARE ZANTAC, ASPIRIN, AND NSAIDS. You need BUDESONIDE 6 MG DAILY FOR 4 WEEKS THEN 3 MG daily for 5 mos. THE GOAL IS LESS THAN 3 STOOL DAILY AND < 1 WATERY STOOL DAILY.   FOLLOW UP IN 1 MOS.

## 2017-03-06 NOTE — Assessment & Plan Note (Signed)
SYMPTOMS NOT IDEALLY CONTROLLED.  FOLLOW A SOFT MECHANICAL DIET.  MEATS SHOULD BE CHOPPED OR GROUND ONLY. DO NOT EAT CHUNKS OF ANYTHING.  HANDOUT GIVEN. Complete endoscopy to stretch your esophagus W/ MAC DUE TO POLYPHARMACY/ANXIETY. DISCUSSED PROCEDURE, BENEFITS, & RISKS: < 1% chance of medication reaction, bleeding, OR perforation. FOLLOW UP IN MAY 2018.

## 2017-03-07 ENCOUNTER — Encounter (HOSPITAL_COMMUNITY): Payer: Self-pay | Admitting: *Deleted

## 2017-03-07 ENCOUNTER — Encounter (HOSPITAL_COMMUNITY)
Admission: RE | Disposition: A | Discharge status: Home / Self Care | Payer: Self-pay | Source: Ambulatory Visit | Attending: Gastroenterology

## 2017-03-07 ENCOUNTER — Ambulatory Visit (HOSPITAL_COMMUNITY)
Admission: RE | Admit: 2017-03-07 | Discharge: 2017-03-07 | Disposition: A | Discharge status: Home / Self Care | Payer: Medicare Other | Source: Ambulatory Visit | Attending: Gastroenterology | Admitting: Gastroenterology

## 2017-03-07 DIAGNOSIS — G8929 Other chronic pain: Secondary | ICD-10-CM | POA: Diagnosis not present

## 2017-03-07 DIAGNOSIS — M545 Low back pain: Secondary | ICD-10-CM | POA: Insufficient documentation

## 2017-03-07 DIAGNOSIS — R1310 Dysphagia, unspecified: Secondary | ICD-10-CM

## 2017-03-07 DIAGNOSIS — I10 Essential (primary) hypertension: Secondary | ICD-10-CM | POA: Diagnosis not present

## 2017-03-07 DIAGNOSIS — K2970 Gastritis, unspecified, without bleeding: Secondary | ICD-10-CM | POA: Diagnosis not present

## 2017-03-07 DIAGNOSIS — Z880 Allergy status to penicillin: Secondary | ICD-10-CM | POA: Diagnosis not present

## 2017-03-07 DIAGNOSIS — Z79899 Other long term (current) drug therapy: Secondary | ICD-10-CM | POA: Insufficient documentation

## 2017-03-07 DIAGNOSIS — F17210 Nicotine dependence, cigarettes, uncomplicated: Secondary | ICD-10-CM | POA: Diagnosis not present

## 2017-03-07 DIAGNOSIS — J449 Chronic obstructive pulmonary disease, unspecified: Secondary | ICD-10-CM | POA: Insufficient documentation

## 2017-03-07 DIAGNOSIS — E785 Hyperlipidemia, unspecified: Secondary | ICD-10-CM | POA: Insufficient documentation

## 2017-03-07 DIAGNOSIS — Z885 Allergy status to narcotic agent status: Secondary | ICD-10-CM | POA: Diagnosis not present

## 2017-03-07 DIAGNOSIS — Z8614 Personal history of Methicillin resistant Staphylococcus aureus infection: Secondary | ICD-10-CM | POA: Diagnosis not present

## 2017-03-07 DIAGNOSIS — Z7951 Long term (current) use of inhaled steroids: Secondary | ICD-10-CM | POA: Diagnosis not present

## 2017-03-07 DIAGNOSIS — K222 Esophageal obstruction: Secondary | ICD-10-CM | POA: Diagnosis not present

## 2017-03-07 DIAGNOSIS — Z882 Allergy status to sulfonamides status: Secondary | ICD-10-CM | POA: Insufficient documentation

## 2017-03-07 HISTORY — PX: ESOPHAGOGASTRODUODENOSCOPY: SHX5428

## 2017-03-07 HISTORY — PX: SAVORY DILATION: SHX5439

## 2017-03-07 SURGERY — EGD (ESOPHAGOGASTRODUODENOSCOPY)
Anesthesia: Moderate Sedation

## 2017-03-07 MED ORDER — SODIUM CHLORIDE 0.9 % IV SOLN
INTRAVENOUS | Status: DC
  Administered 2017-03-07: 1000 mL via INTRAVENOUS

## 2017-03-07 MED ORDER — MIDAZOLAM HCL 5 MG/5ML IJ SOLN
INTRAMUSCULAR | Status: DC | PRN
  Administered 2017-03-07: 2 mg via INTRAVENOUS
  Administered 2017-03-07: 1 mg via INTRAVENOUS
  Administered 2017-03-07 (×2): 2 mg via INTRAVENOUS
  Administered 2017-03-07: 1 mg via INTRAVENOUS
  Administered 2017-03-07: 2 mg via INTRAVENOUS

## 2017-03-07 MED ORDER — PROMETHAZINE HCL 25 MG/ML IJ SOLN
25.0000 mg | Freq: Once | INTRAMUSCULAR | Status: AC
  Administered 2017-03-07: 25 mg via INTRAVENOUS

## 2017-03-07 MED ORDER — MEPERIDINE HCL 100 MG/ML IJ SOLN
INTRAMUSCULAR | Status: DC
Start: 2017-03-07 — End: 2017-03-07
  Filled 2017-03-07: qty 2

## 2017-03-07 MED ORDER — MIDAZOLAM HCL 5 MG/5ML IJ SOLN
INTRAMUSCULAR | Status: DC
Start: 2017-03-07 — End: 2017-03-07
  Filled 2017-03-07: qty 10

## 2017-03-07 MED ORDER — LIDOCAINE VISCOUS 2 % MT SOLN
OROMUCOSAL | Status: DC | PRN
  Administered 2017-03-07: 1 via OROMUCOSAL

## 2017-03-07 MED ORDER — STERILE WATER FOR IRRIGATION IR SOLN
Status: DC | PRN
  Administered 2017-03-07: 2.5 mL

## 2017-03-07 MED ORDER — SODIUM CHLORIDE 0.9% FLUSH
INTRAVENOUS | Status: AC
  Filled 2017-03-07: qty 10

## 2017-03-07 MED ORDER — LIDOCAINE VISCOUS 2 % MT SOLN
OROMUCOSAL | Status: AC
  Filled 2017-03-07: qty 15

## 2017-03-07 MED ORDER — MEPERIDINE HCL 100 MG/ML IJ SOLN
INTRAMUSCULAR | Status: DC | PRN
  Administered 2017-03-07: 50 mg via INTRAVENOUS
  Administered 2017-03-07: 25 mg via INTRAVENOUS
  Administered 2017-03-07: 50 mg via INTRAVENOUS
  Administered 2017-03-07 (×2): 25 mg via INTRAVENOUS

## 2017-03-07 MED ORDER — OMEPRAZOLE 20 MG PO CPDR: DELAYED_RELEASE_CAPSULE | ORAL | 11 refills | Status: DC

## 2017-03-07 MED ORDER — MINERAL OIL PO OIL
TOPICAL_OIL | ORAL | Status: AC
  Filled 2017-03-07: qty 30

## 2017-03-07 MED ORDER — PROMETHAZINE HCL 25 MG/ML IJ SOLN
INTRAMUSCULAR | Status: AC
  Filled 2017-03-07: qty 1

## 2017-03-07 NOTE — H&P (View-Only) (Signed)
Subjective:    Patient ID: Garrett Klein, male    DOB: 20-Jan-1956, 61 y.o.   MRN: [id number]  [name], MD  HPI HAVING TROUBLE SWALLOWING SOLIDS. CAN EAT AND THREE HOURS LATER AND HE COUGHS AND A PIECE OF MEAT IN THE BACK OF HIS THROAT. LAST TIME STRETCHED HE FELT BETTER. STILL SMOKING 1 PK/DAY. HEARTBURN; NOT REALLY. FEELS DULL PAIN IN CHEST(MIDDLE). TOOK XANAX AND NOW HE'S OFF. MAY BE DEPRESSED. WAS ON ENTOCORT. STOOL  WAS HARD AND NOW HIS DIARRHEA IS BACK (UP TO 4 IN THE AM)-2-3 WEEKS AGO. IF HE EATS HE'S GONNA HAVE TO GO TO THE BATHROOM.   PT DENIES FEVER, CHILLS, HEMATOCHEZIA, HEMATEMESIS, nausea, vomiting, melena, diarrhea, CHEST PAIN, SHORTNESS OF BREATH,  CHANGE IN BOWEL IN HABITS, constipation, abdominal pain, problems with sedation, OR heartburn or indigestion.  Past Medical History:  Diagnosis Date  . Chronic lower back pain   . Collagenous colitis [address] [place]  . COPD (chronic obstructive pulmonary disease) ([place])   . Essential hypertension   . Hyperlipidemia   . MRSA infection   . PUD (peptic ulcer disease)    Past Surgical History:  Procedure Laterality Date  . BACK SURGERY    . COLONOSCOPY  2001   Dr. Sharlett Iles, normal  . COLONOSCOPY  04/2013   [facility]: Fentanyl 200mg Danford Bad 10mg : sigmoid diverticulosis, normal terminal ileum, small internal hemorrhoids, random colon bx: collagenous colitis. next tcs 04/2018  . COLONOSCOPY  2011   [facility]: two polyps, sessile serrated adenoma, mild diverticulosis  . ELBOW BURSA SURGERY    . ESOPHAGEAL DILATION N/A 05/19/2015   Procedure: ESOPHAGEAL DILATION;  Surgeon: Danie Binder, MD;  [facility];  Service: Endoscopy;  Laterality: N/A;  . ESOPHAGOGASTRODUODENOSCOPY N/A 10/28/2014   SLF: 1. Esophagitis due to GERD. KOH neg. 2. Small hiatal hernia 3. Moderate erosive gastritis 4. RUQ pain due to large ulcer 5. [place] duodentis in the bulb.   . ESOPHAGOGASTRODUODENOSCOPY N/A 05/19/2015   SLF: 1. Mild distal  esophagitis 2. Peptic stricture at the gastroesophageal junction 3. Mild non-erosive gastritis 4. Pseudo pylorus due to prior PUD.   Marland Kitchen JOINT REPLACEMENT     left hip surgery - January 26, 2014  . ROTATOR CUFF REPAIR     November 2014   Allergies  Allergen Reactions  . Bee Venom Swelling  . Ciprofloxacin Other (See Comments)    Turns red from the chest up.  Marland Kitchen Hydrocodone     Rash   . Levaquin [Levofloxacin In D5w]     CANDIDA ALL OVER MOUTH TO PENIS  . Penicillins Other (See Comments)    Reaction in childhood, caused patient to pass out  . Tramadol     Hives   . Sulfa Antibiotics Rash   Current Outpatient Prescriptions  Medication Sig Dispense Refill  . albuterol (PROVENTIL HFA;VENTOLIN HFA) 108 (90 BASE) MCG/ACT inhaler Inhale 2 puffs into the lungs every 6 (six) hours as needed for wheezing or shortness of breath.    Marland Kitchen albuterol (PROVENTIL) (2.5 MG/3ML) 0.083% nebulizer solution Take 2.5 mg by nebulization every 6 (six) hours as needed for wheezing or shortness of breath.    Marland Kitchen atorvastatin (LIPITOR) 80 MG tablet Take 40 mg by mouth every evening.     .      . cholecalciferol (VITAMIN D) 1000 UNITS tablet Take 1,000 Units by mouth 4 (four) times daily.     Marland Kitchen EPINEPHrine 0.3 mg/0.3 mL IJ SOAJ injection Inject 0.3  mg into the muscle as needed (allergic reaction).    . gabapentin (NEURONTIN) 400 MG capsule Take 400 mg by mouth 3 (three) times daily.    Marland Kitchen lisinopril-hydrochlorothiazide (PRINZIDE,ZESTORETIC) 10-12.5 MG tablet Take 1 tablet by mouth daily.    . nicotine (NICODERM CQ - DOSED IN MG/24 HOURS) 21 mg/24hr patch Place 21 mg onto the skin daily.    Marland Kitchen omeprazole (PRILOSEC) 20 MG capsule TAKE ONE CAPSULE BY MOUTH TWICE DAILY BEFORE  A  MEAL    . sildenafil (VIAGRA) 100 MG tablet Take 100 mg by mouth daily as needed for erectile dysfunction.    . SYMBICORT 80-4.5 MCG/ACT inhaler Inhale 2 puffs into the lungs 2 (two) times daily.     Marland Kitchen umeclidinium bromide (INCRUSE ELLIPTA) 62.5  MCG/INH AEPB Inhale 1 puff into the lungs 2 (two) times daily.     Review of Systems PER HPI OTHERWISE ALL SYSTEMS ARE NEGATIVE.    Objective:   Physical Exam  Constitutional: He is oriented to person, place, and time. He appears well-developed and well-nourished. No distress.  HENT:  Head: Normocephalic and atraumatic.  Mouth/Throat: Oropharynx is clear and moist. No oropharyngeal exudate.  Eyes: Pupils are equal, round, and reactive to light. No scleral icterus.  Neck: Normal range of motion. Neck supple.  Cardiovascular: Normal rate, regular rhythm and normal heart sounds.   Pulmonary/Chest: Effort normal and breath sounds normal. No respiratory distress.  Abdominal: Soft. Bowel sounds are normal. He exhibits no distension. There is no tenderness.  Musculoskeletal: He exhibits no edema.  Lymphadenopathy:    He has no cervical adenopathy.  Neurological: He is alert and oriented to person, place, and time.  NO FOCAL DEFICITS  Psychiatric:  FLAT AFFECT, ANXIOUS MOOD  Vitals reviewed.     Assessment & Plan:

## 2017-03-07 NOTE — Interval H&P Note (Signed)
History and Physical Interval Note:  03/07/2017 1:00 PM  Garrett Klein  has presented today for surgery, with the diagnosis of DYSPHAGIA  The various methods of treatment have been discussed with the patient and family. After consideration of risks, benefits and other options for treatment, the patient has consented to  Procedure(s) with comments: ESOPHAGOGASTRODUODENOSCOPY (EGD) (N/A) - 200 SAVORY DILATION (N/A) as a surgical intervention .  The patient's history has been reviewed, patient examined, no change in status, stable for surgery.  I have reviewed the patient's chart and labs.  Questions were answered to the patient's satisfaction.     [employer]

## 2017-03-07 NOTE — Op Note (Signed)
[facility] Patient Name: Garrett Klein Procedure Date: 03/07/2017 12:37 PM MRN: [id number] Date of Birth: [birth year] Attending MD: Barney Drain , MD CSN: [id number] Age: 61 Admit Type: Outpatient Procedure:                Upper GI endoscopy WITH ESOPHAGEAL DILATION Indications:              Dysphagia Providers:                Barney Drain, MD, Janeece Riggers, RN, Lurline Del, RN Referring MD:             Fuller Canada Manuella Ghazi MD, MD Medicines:                Promethazine 25 mg IV, Meperidine 175 mg IV,                            Midazolam 10 mg IV Complications:            No immediate complications. Estimated Blood Loss:     Estimated blood loss was minimal. Procedure:                Pre-Anesthesia Assessment:                           - Prior to the procedure, a History and Physical                            was performed, and patient medications and                            allergies were reviewed. The patient's tolerance of                            previous anesthesia was also reviewed. The risks                            and benefits of the procedure and the sedation                            options and risks were discussed with the patient.                            All questions were answered, and informed consent                            was obtained. Prior Anticoagulants: The patient has                            taken no previous anticoagulant or antiplatelet                            agents. ASA Grade Assessment: II - A patient with                            mild systemic disease. After reviewing the risks  and benefits, the patient was deemed in                            satisfactory condition to undergo the procedure.                           After obtaining informed consent, the endoscope was                            passed under direct vision. Throughout the                            procedure, the patient's blood pressure, pulse,  and                            oxygen saturations were monitored continuously. The                            [id number] ([id number]) scope was introduced through the                            mouth, and advanced to the second part of duodenum.                            The upper GI endoscopy was technically difficult                            and complex due to the patient's agitation.                            Successful completion of the procedure was aided by                            increasing the dose of sedation medication. The                            patient tolerated the procedure fairly well. Scope In: 2:19:58 PM Scope Out: 2:29:58 PM Total Procedure Duration: 0 hours 10 minutes 0 seconds  Findings:      One moderate (circumferential scarring or stenosis; an endoscope may       pass) benign-appearing, intrinsic stenosis was found. This measured 1.3       cm (inner diameter) and was traversed. A guidewire was placed and the       scope was withdrawn. Dilation was performed with a Savary dilator with       mild resistance at 14 mm, 15 mm, 16 mm, 17 mm and 18 mm. Estimated blood       loss was minimal.      Patchy mild inflammation characterized by congestion (edema) and       erythema was found in the gastric antrum.      The examined duodenum was normal. Impression:               - Benign-appearing esophageal stenosis. Dilated.                           -  MILD Gastritis. Moderate Sedation:      Moderate (conscious) sedation was administered by the endoscopy nurse       and supervised by the endoscopist. The following parameters were       monitored: oxygen saturation, heart rate, blood pressure, and response       to care. Total physician intraservice time was 35 minutes. Recommendation:           - Resume previous diet.                           - Continue present medications. OMEPRAZOLE 30 MINS                            PRIOR TO BREAKFAST                           -  Return to my office in 1 month.                           - Refer to a gastroenterologist at the next                            available appointment([facility]).                           - Patient has a contact number available for                            emergencies. The signs and symptoms of potential                            delayed complications were discussed with the                            patient. Return to normal activities tomorrow.                            Written discharge instructions were provided to the                            patient. Procedure Code(s):        --- Professional ---                           [phone], Esophagogastroduodenoscopy, flexible,                            transoral; with insertion of guide wire followed by                            passage of dilator(s) through esophagus over guide                            wire                           [id number],  Moderate sedation services provided by the                            same physician or other qualified health care                            professional performing the diagnostic or                            therapeutic service that the sedation supports,                            requiring the presence of an independent trained                            observer to assist in the monitoring of the                            patient's level of consciousness and physiological                            status; initial 15 minutes of intraservice time,                            patient age 14 years or older                           [phone], Moderate sedation services; each additional                            15 minutes intraservice time Diagnosis Code(s):        --- Professional ---                           K22.2, Esophageal obstruction                           K29.70, Gastritis, unspecified, without bleeding                           R13.10, Dysphagia, unspecified CPT copyright 2016  American Medical Association. All rights reserved. The codes documented in this report are preliminary and upon coder review may  be revised to meet current compliance requirements. Barney Drain, MD Barney Drain, MD 03/07/2017 2:44:47 PM This report has been signed electronically. Number of Addenda: 0

## 2017-03-11 ENCOUNTER — Encounter (HOSPITAL_COMMUNITY): Payer: Self-pay | Admitting: Gastroenterology

## 2017-03-12 ENCOUNTER — Encounter (HOSPITAL_COMMUNITY): Payer: Self-pay | Admitting: Gastroenterology

## 2017-03-14 ENCOUNTER — Other Ambulatory Visit (HOSPITAL_COMMUNITY): Payer: Self-pay | Admitting: Nurse Practitioner

## 2017-03-14 DIAGNOSIS — Z720 Tobacco use: Secondary | ICD-10-CM

## 2017-03-14 DIAGNOSIS — F17210 Nicotine dependence, cigarettes, uncomplicated: Secondary | ICD-10-CM

## 2017-03-27 ENCOUNTER — Encounter (HOSPITAL_COMMUNITY): Payer: Self-pay

## 2017-03-27 ENCOUNTER — Ambulatory Visit (HOSPITAL_COMMUNITY)
Admission: RE | Admit: 2017-03-27 | Discharge: 2017-03-27 | Disposition: A | Discharge status: Home / Self Care | Payer: Medicare Other | Source: Ambulatory Visit | Attending: Nurse Practitioner | Admitting: Nurse Practitioner

## 2017-03-31 ENCOUNTER — Telehealth: Payer: Self-pay | Admitting: General Practice

## 2017-03-31 NOTE — Telephone Encounter (Signed)
-----   Message from Idamae Schuller sent at 03/31/2017  3:06 PM EDT ----- Regarding: Stomach Bx The above patient's wife called in about his sotmach Bx that was done 02/2017, however I see you made note on his AVS that you Biopsied his stomach, however I can't find any record you did a Bx.  Also, can you specify the reason for the [facility] referral?   Thanks,  Rosendo Gros

## 2017-03-31 NOTE — Telephone Encounter (Signed)
I spoke with Dr. Oneida Alar and she stated that the mention of Biopsy was a typo and she will give the patient a call tomorrow.

## 2017-03-31 NOTE — Telephone Encounter (Signed)
She also stated she will address his [facility] referral at his ov next week.

## 2017-03-31 NOTE — Telephone Encounter (Signed)
Routing to Dr. Oneida Alar & I also made the patient aware about the typo and that she will be calling them tomorrow.

## 2017-04-01 NOTE — Discharge Instructions (Signed)
I dilated your esophagus. You have a stricture near the base of your esophagus. You have a small hiatal hernia. You have mild gastritis.    DRINK WATER TO KEEP YOUR URINE LIGHT YELLOW.  FOLLOW A LOW FAT DIET. MEATS SHOULD BE BAKED, BROILED, OR BOILED. AVOID FRIED FOODS.  AVOID REFLUX TRIGGERS. SEE INFO BELOW.  I AM REFERRING YOU TO [facility] FOR A SWALLOWING EVALUATION.  FOLLOW UP IN MAY 2018. UPPER ENDOSCOPY AFTER CARE Read the instructions outlined below and refer to this sheet in the next week. These discharge instructions provide you with general information on caring for yourself after you leave the hospital. While your treatment has been planned according to the most current medical practices available, unavoidable complications occasionally occur. If you have any problems or questions after discharge, call DR. Arisa Congleton, [phone].  ACTIVITY  You may resume your regular activity, but move at a slower pace for the next 24 hours.   Take frequent rest periods for the next 24 hours.   Walking will help get rid of the air and reduce the bloated feeling in your belly (abdomen).   No driving for 24 hours (because of the medicine (anesthesia) used during the test).   You may shower.   Do not sign any important legal documents or operate any machinery for 24 hours (because of the anesthesia used during the test).    NUTRITION  Drink plenty of fluids.   You may resume your normal diet as instructed by your doctor.   Begin with a light meal and progress to your normal diet. Heavy or fried foods are harder to digest and may make you feel sick to your stomach (nauseated).   Avoid alcoholic beverages for 24 hours or as instructed.    MEDICATIONS  You may resume your normal medications.   WHAT YOU CAN EXPECT TODAY  Some feelings of bloating in the abdomen.   Passage of more gas than usual.    IF YOU HAD A BIOPSY TAKEN DURING THE UPPER ENDOSCOPY:  No aspirin products for  3 days.   Eat a soft diet IF YOU HAVE NAUSEA, BLOATING, ABDOMINAL PAIN, OR VOMITING.    FINDING OUT THE RESULTS OF YOUR TEST Not all test results are available during your visit. DR. Oneida Alar WILL CALL YOU WITHIN 7 DAYS OF YOUR PROCEDUE WITH YOUR RESULTS. Do not assume everything is normal if you have not heard from DR. Varetta Chavers IN ONE WEEK, CALL HER OFFICE AT [phone].  SEEK IMMEDIATE MEDICAL ATTENTION AND CALL THE OFFICE: [phone] IF:  You have more than a spotting of blood in your stool.   Your belly is swollen (abdominal distention).   You are nauseated or vomiting.   You have a temperature over 101F.   You have abdominal pain or discomfort that is severe or gets worse throughout the day.  Gastritis  Gastritis is an inflammation (the body's way of reacting to injury and/or infection) of the stomach. It is often caused by viral or bacterial (germ) infections. It can also be caused BY ASPIRIN, BC/GOODY POWDER'S, (IBUPROFEN) MOTRIN, OR ALEVE (NAPROXEN), chemicals (including alcohol), SPICY FOODS, and medications. This illness may be associated with generalized malaise (feeling tired, not well), UPPER ABDOMINAL STOMACH cramps, and fever. One common bacterial cause of gastritis is an organism known as H. Pylori. This can be treated with antibiotics.   ESOPHAGEAL STRICTURE  Esophageal strictures can be caused by stomach acid backing up into the tube that carries food from the mouth down  to the stomach (lower esophagus).  TREATMENT There are a number of medicines used to treat reflux/stricture, including: Antacids.  Proton-pump inhibitors: OMEPRAZOLE  HOME CARE INSTRUCTIONS Eat 2-3 hours before going to bed.  Try to reach and maintain a healthy weight.  Do not eat just a few very large meals. Instead, eat 4 TO 6 smaller meals throughout the day.  Try to identify foods and beverages that make your symptoms worse, and avoid these.  Avoid tight clothing.  Do not exercise right  after eating.   Lifestyle and home remedies TO CONTROL REFLUX You may eliminate or reduce the frequency of heartburn by making the following lifestyle changes:   Control your weight. Being overweight is a major risk factor for heartburn and GERD. Excess pounds put pressure on your abdomen, pushing up your stomach and causing acid to back up into your esophagus.    Eat smaller meals. 4 TO 6 MEALS A DAY. This reduces pressure on the lower esophageal sphincter, helping to prevent the valve from opening and acid from washing back into your esophagus.    Loosen your belt. Clothes that fit tightly around your waist put pressure on your abdomen and the lower esophageal sphincter.    Eliminate heartburn triggers. Everyone has specific triggers. Common triggers such as fatty or fried foods, spicy food, tomato sauce, carbonated beverages, alcohol, chocolate, mint, garlic, onion, caffeine and nicotine may make heartburn worse.    Avoid stooping or bending. Tying your shoes is OK. Bending over for longer periods to weed your garden isn't, especially soon after eating.    Don't lie down after a meal. Wait at least three to four hours after eating before going to bed, and don't lie down right after eating.   Alternative medicine  Several home remedies exist for treating GERD, but they provide only temporary relief. They include drinking baking soda (sodium bicarbonate) added to water or drinking other fluids such as baking soda mixed with cream of tartar and water.  Although these liquids create temporary relief by neutralizing, washing away or buffering acids, eventually they aggravate the situation by adding gas and fluid to your stomach, increasing pressure and causing more acid reflux. Further, adding more sodium to your diet may increase your blood pressure and add stress to your heart, and excessive bicarbonate ingestion can alter the acid-base balance in your body.

## 2017-04-01 NOTE — Telephone Encounter (Signed)
Called patient TO DISCUSS CONCERNS. LVM-CALL [phone] TO DISCUSS CONCERNS. I REVIEWED EGD REPORT AND AVS IS INCORRECT. NEEDS REFERRAL TO [facility] DUE TO HIS PROBLEMS SWALLOWING.

## 2017-04-10 ENCOUNTER — Telehealth: Payer: Self-pay

## 2017-04-10 ENCOUNTER — Ambulatory Visit (INDEPENDENT_AMBULATORY_CARE_PROVIDER_SITE_OTHER): Payer: Medicare Other | Admitting: Gastroenterology

## 2017-04-10 ENCOUNTER — Ambulatory Visit: Payer: Medicare Other | Admitting: Gastroenterology

## 2017-04-10 ENCOUNTER — Encounter: Payer: Self-pay | Admitting: Gastroenterology

## 2017-04-10 ENCOUNTER — Other Ambulatory Visit: Payer: Self-pay

## 2017-04-10 VITALS — BP 139/91 | HR 91 | Temp 97.0°F | Ht 70.0 in | Wt 186.2 lb

## 2017-04-10 DIAGNOSIS — R195 Other fecal abnormalities: Secondary | ICD-10-CM

## 2017-04-10 DIAGNOSIS — D126 Benign neoplasm of colon, unspecified: Secondary | ICD-10-CM

## 2017-04-10 DIAGNOSIS — R1310 Dysphagia, unspecified: Secondary | ICD-10-CM

## 2017-04-10 DIAGNOSIS — K52831 Collagenous colitis: Secondary | ICD-10-CM

## 2017-04-10 DIAGNOSIS — R1319 Other dysphagia: Secondary | ICD-10-CM

## 2017-04-10 MED ORDER — PEG-KCL-NACL-NASULF-NA ASC-C 100 G PO SOLR: 1.0000 | ORAL | 0 refills | Status: DC

## 2017-04-10 NOTE — Telephone Encounter (Signed)
LMOVM at home and informed pt of pre-op appt 06/05/17 at 10:00am. Received fax from pharmacy, Moviprep needed PA.   Called and spoke to his wife on mobile. Informed of pre-op. Also told his wife to come by office to pick up sample Moviprep since his insurance doesn't cover it.

## 2017-04-10 NOTE — Progress Notes (Signed)
cc'ed to pcp °

## 2017-04-10 NOTE — Assessment & Plan Note (Signed)
CLINICALLY IMPROVED AFTER BUDESONIDE APR 19. NOW ON ONE DAILY.  CONTINUE ENTOCORT DALY. TCS AFTER JUL 19. FOLLOW UP IN 6 MOS.

## 2017-04-10 NOTE — Progress Notes (Signed)
Subjective:    Patient ID: Garrett Klein, male    DOB: [birth year], 61 y.o.   MRN: [id number] [name], MD   HPI HAD QUESTION ABOUT HIS EGD/DIL. WANTS TO KNOW IF HE NEEDS TO SEE [facility] FOR SWALLOWING EVALUATION. SAW ENT AND NOTHING TO ADD. NO OBSTRUCTION. NO LONGER FEELS LIKE THINGS GET STUCK GOING DOWN. DOES HAS SLEEP APNEA. HAD HEME POSITIVE STOOLS. BMs: NOW 1 DAILY(#4). NOW TAKING BUDESONIDE 1 DAILY. LAST TCS 2011-SERRATED ADENOMA REMOVED(AC). MAY SEE BLOOD WHEN HE WIPED. Problems with sedation: AFTER EGD/DIL WAS AGITATED AFTERWARDS(PHENERGAN 25 MG/D175 MG/V10). BREATHING-GOES FOR CT SCAN JUN 4(HUMIDITY MAKING HIS BREATHING WORSE/CHEST TIGHT/LINGS HURT).  PT DENIES FEVER, CHILLS, HEMATEMESIS, nausea, vomiting, melena, diarrhea, CHANGE IN BOWEL IN HABITS, constipation, abdominal pain, OR heartburn or indigestion.  Past Medical History:  Diagnosis Date  . Chronic lower back pain   . Collagenous colitis [address] [place]  . COPD (chronic obstructive pulmonary disease) ([place])   . Essential hypertension   . Hyperlipidemia   . MRSA infection   . PUD (peptic ulcer disease)    Past Surgical History:  Procedure Laterality Date  . BACK SURGERY    . COLONOSCOPY  2001   Dr. Sharlett Iles, normal  . COLONOSCOPY  04/2013   [facility]: Fentanyl 200mg Danford Bad 10mg : sigmoid diverticulosis, normal terminal ileum, small internal hemorrhoids, random colon bx: collagenous colitis. next tcs 04/2018  . COLONOSCOPY  2011   [facility]: two polyps, sessile serrated adenoma, mild diverticulosis  . ELBOW BURSA SURGERY    . ESOPHAGEAL DILATION N/A 05/19/2015   Procedure: ESOPHAGEAL DILATION;  Surgeon: Danie Binder, MD;  [facility]: [facility];  Service: Endoscopy;  Laterality: N/A;  . ESOPHAGOGASTRODUODENOSCOPY N/A 10/28/2014   SLF: 1. Esophagitis due to GERD. KOH neg. 2. Small hiatal hernia 3. Moderate erosive gastritis 4. RUQ pain due to large ulcer 5. [place] duodentis in the bulb.   . ESOPHAGOGASTRODUODENOSCOPY  N/A 05/19/2015   SLF: 1. Mild distal esophagitis 2. Peptic stricture at the gastroesophageal junction 3. Mild non-erosive gastritis 4. Pseudo pylorus due to prior PUD.   Marland Kitchen ESOPHAGOGASTRODUODENOSCOPY N/A 03/07/2017   Procedure: ESOPHAGOGASTRODUODENOSCOPY (EGD);  Surgeon: Danie Binder, MD;  Location: [facility];  Service: Endoscopy;  Laterality: N/A;  200  . FINGER AMPUTATION Left 1985   index finger  . JOINT REPLACEMENT     left hip surgery - January 26, 2014  . ROTATOR CUFF REPAIR     November 2014  . SAVORY DILATION N/A 03/07/2017   Procedure: SAVORY DILATION;  Surgeon: Danie Binder, MD;  Location: [facility];  Service: Endoscopy;  Laterality: N/A;   Allergies  Allergen Reactions  . Bee Venom Swelling  . Ciprofloxacin Other (See Comments)    Turns red from the chest up.  Marland Kitchen Hydrocodone     Rash   . Levaquin [Levofloxacin In D5w]     CANDIDA ALL OVER MOUTH TO PENIS  . Penicillins Other (See Comments)    Reaction in childhood, caused patient to pass out  . Tramadol     Hives   . Sulfa Antibiotics Rash   Current Outpatient Prescriptions  Medication Sig Dispense Refill  . albuterol (PROVENTIL HFA;VENTOLIN HFA) 108 (90 BASE) MCG/ACT inhaler Inhale 2 puffs into the lungs every 6 (six) hours as needed for wheezing or shortness of breath.    Marland Kitchen albuterol (PROVENTIL) (2.5 MG/3ML) 0.083% nebulizer solution Take 2.5 mg by nebulization every 6 (six) hours as needed for wheezing or  shortness of breath.    Marland Kitchen atorvastatin (LIPITOR) 80 MG tablet Take 80 mg by mouth every evening.     . budesonide (ENTOCORT EC) 3 MG 24 hr capsule 2 PO DAILY FOR 1 MO THEN 1 PO DAILY (Patient taking differently: Take 3 mg by mouth daily. 2 PO DAILY FOR 1 MO THEN 1 PO DAILY)    . cholecalciferol (VITAMIN D) 1000 UNITS tablet Take 1,000 Units by mouth daily.     Marland Kitchen EPINEPHrine 0.3 mg/0.3 mL IJ SOAJ injection Inject 0.3 mg into the muscle as needed (allergic reaction).    Marland Kitchen lisinopril-hydrochlorothiazide  (PRINZIDE,ZESTORETIC) 10-12.5 MG tablet Take 1 tablet by mouth daily.    . nicotine (NICODERM CQ - DOSED IN MG/24 HOURS) 21 mg/24hr patch Place 21 mg onto the skin daily.    Marland Kitchen omeprazole (PRILOSEC) 20 MG capsule 1 PO 30 MINS BEFORE YOUR FIRST MEAL EVERY DAY FOREVER    . sildenafil (VIAGRA) 100 MG tablet Take 100 mg by mouth daily as needed for erectile dysfunction.    . SYMBICORT 80-4.5 MCG/ACT inhaler Inhale 2 puffs into the lungs 2 (two) times daily.     Marland Kitchen umeclidinium bromide (INCRUSE ELLIPTA) 62.5 MCG/INH AEPB Inhale 1 puff into the lungs 2 (two) times daily.     Review of Systems PER HPI OTHERWISE ALL SYSTEMS ARE NEGATIVE.    Objective:   Physical Exam  Constitutional: He is oriented to person, place, and time. He appears well-developed and well-nourished. No distress.  HENT:  Head: Normocephalic and atraumatic.  Mouth/Throat: Oropharynx is clear and moist. No oropharyngeal exudate.  Eyes: Pupils are equal, round, and reactive to light. No scleral icterus.  Neck: Normal range of motion. Neck supple.  Cardiovascular: Normal rate, regular rhythm and normal heart sounds.   Pulmonary/Chest: Effort normal and breath sounds normal. No respiratory distress.  Abdominal: Soft. Bowel sounds are normal. He exhibits no distension. There is no tenderness.  Musculoskeletal: He exhibits no edema.  Lymphadenopathy:    He has no cervical adenopathy.  Neurological: He is alert and oriented to person, place, and time.  NO FOCAL DEFICITS  Psychiatric:  SLIGHTLY ANXIOUS MOOD, NL AFFECT  Vitals reviewed.     Assessment & Plan:

## 2017-04-10 NOTE — Patient Instructions (Signed)
PA info for colonoscopy submitted via [facility] website. No PA needed, Decision ID# [id number].

## 2017-04-10 NOTE — Assessment & Plan Note (Signed)
SYMPTOMS CONTROLLED/RESOLVED.  CONTINUE TO MONITOR SYMPTOMS. SEE NEUROLOGY FOR SILENT ASPIRATION.  CONTINUE OMEPRAZOLE.  TAKE 30 MINUTES PRIOR TO YOUR FIRST MEAL. FOLLOW UP IN 6 MOS.

## 2017-04-10 NOTE — Patient Instructions (Addendum)
SEE NEUROLOGY FOR SILENT ASPIRATION.   COMPLETE COLONOSCOPY WITH PROPOFOL AFTER JUL 19.  CONTINUE OMEPRAZOLE.  TAKE 30 MINUTES PRIOR TO YOUR FIRST MEAL.  FOLLOW UP IN 6 MOS.

## 2017-04-10 NOTE — Progress Notes (Signed)
ON RECALL  °

## 2017-04-10 NOTE — Assessment & Plan Note (Signed)
LAST TCS 2011 HEME POSITIVE STOOLS. RARE RECTAL BLEEDING.  COMPLETE COLONOSCOPY WITH PROPOFOL AFTER JUL 19. DISCUSSED PROCEDURE, BENEFITS, & RISKS: < 1% chance of medication reaction, bleeding, perforation, or rupture of spleen/liver.

## 2017-04-11 ENCOUNTER — Other Ambulatory Visit: Payer: Self-pay

## 2017-04-11 DIAGNOSIS — R1310 Dysphagia, unspecified: Secondary | ICD-10-CM

## 2017-04-21 ENCOUNTER — Ambulatory Visit (HOSPITAL_COMMUNITY)
Admission: RE | Admit: 2017-04-21 | Discharge: 2017-04-21 | Disposition: A | Discharge status: Home / Self Care | Payer: Medicare Other | Source: Ambulatory Visit | Attending: Nurse Practitioner | Admitting: Nurse Practitioner

## 2017-04-21 DIAGNOSIS — I2510 Atherosclerotic heart disease of native coronary artery without angina pectoris: Secondary | ICD-10-CM | POA: Insufficient documentation

## 2017-04-21 DIAGNOSIS — I700 Atherosclerosis of aorta: Secondary | ICD-10-CM | POA: Diagnosis not present

## 2017-04-21 DIAGNOSIS — J432 Centrilobular emphysema: Secondary | ICD-10-CM | POA: Insufficient documentation

## 2017-04-21 DIAGNOSIS — F17210 Nicotine dependence, cigarettes, uncomplicated: Secondary | ICD-10-CM | POA: Diagnosis present

## 2017-04-21 DIAGNOSIS — R590 Localized enlarged lymph nodes: Secondary | ICD-10-CM | POA: Insufficient documentation

## 2017-04-21 DIAGNOSIS — Z720 Tobacco use: Secondary | ICD-10-CM

## 2017-04-21 NOTE — Telephone Encounter (Signed)
Pt came by office and picked up Moviprep sample. Copy of instructions and pre-op letter also given to pt.

## 2017-05-30 ENCOUNTER — Telehealth: Payer: Self-pay

## 2017-05-30 NOTE — Telephone Encounter (Signed)
Pt's wife called office this morning to see if his TCS for 06/10/17 could be rescheduled d/t she has an appt that day. Called Endo Scheduler. If pt can go to pre-op appt 06/02/17 at 11:00am he can have his procedure 06/03/17 at 9:30am. Called pt's wife. TCS rescheduled to 06/03/17 at 9:30am. He will go to pre-op appt 06/02/17 at 11:00am. New instructions for prep given to his wife and she wrote it down. LMOVM and informed Endo Scheduler.

## 2017-06-02 ENCOUNTER — Encounter (HOSPITAL_COMMUNITY): Payer: Self-pay

## 2017-06-02 ENCOUNTER — Encounter (HOSPITAL_COMMUNITY)
Admission: RE | Admit: 2017-06-02 | Discharge: 2017-06-02 | Disposition: A | Discharge status: Home / Self Care | Payer: Medicare Other | Source: Ambulatory Visit | Attending: Gastroenterology | Admitting: Gastroenterology

## 2017-06-03 ENCOUNTER — Ambulatory Visit (HOSPITAL_COMMUNITY): Payer: Medicare Other | Admitting: Anesthesiology

## 2017-06-03 ENCOUNTER — Encounter (HOSPITAL_COMMUNITY): Payer: Self-pay

## 2017-06-03 ENCOUNTER — Encounter (HOSPITAL_COMMUNITY)
Admission: RE | Disposition: A | Discharge status: Home / Self Care | Payer: Self-pay | Source: Ambulatory Visit | Attending: Gastroenterology

## 2017-06-03 ENCOUNTER — Ambulatory Visit (HOSPITAL_COMMUNITY)
Admission: RE | Admit: 2017-06-03 | Discharge: 2017-06-03 | Disposition: A | Discharge status: Home / Self Care | Payer: Medicare Other | Source: Ambulatory Visit | Attending: Gastroenterology | Admitting: Gastroenterology

## 2017-06-03 DIAGNOSIS — Q438 Other specified congenital malformations of intestine: Secondary | ICD-10-CM | POA: Insufficient documentation

## 2017-06-03 DIAGNOSIS — J449 Chronic obstructive pulmonary disease, unspecified: Secondary | ICD-10-CM | POA: Diagnosis not present

## 2017-06-03 DIAGNOSIS — Z86010 Personal history of colonic polyps: Secondary | ICD-10-CM | POA: Insufficient documentation

## 2017-06-03 DIAGNOSIS — D126 Benign neoplasm of colon, unspecified: Secondary | ICD-10-CM | POA: Diagnosis not present

## 2017-06-03 DIAGNOSIS — Z79899 Other long term (current) drug therapy: Secondary | ICD-10-CM | POA: Diagnosis not present

## 2017-06-03 DIAGNOSIS — K5730 Diverticulosis of large intestine without perforation or abscess without bleeding: Secondary | ICD-10-CM | POA: Insufficient documentation

## 2017-06-03 DIAGNOSIS — Z882 Allergy status to sulfonamides status: Secondary | ICD-10-CM | POA: Insufficient documentation

## 2017-06-03 DIAGNOSIS — I10 Essential (primary) hypertension: Secondary | ICD-10-CM | POA: Insufficient documentation

## 2017-06-03 DIAGNOSIS — K635 Polyp of colon: Secondary | ICD-10-CM | POA: Insufficient documentation

## 2017-06-03 DIAGNOSIS — R195 Other fecal abnormalities: Secondary | ICD-10-CM

## 2017-06-03 DIAGNOSIS — Z881 Allergy status to other antibiotic agents status: Secondary | ICD-10-CM | POA: Diagnosis not present

## 2017-06-03 DIAGNOSIS — Z91030 Bee allergy status: Secondary | ICD-10-CM | POA: Insufficient documentation

## 2017-06-03 DIAGNOSIS — Z1211 Encounter for screening for malignant neoplasm of colon: Secondary | ICD-10-CM | POA: Insufficient documentation

## 2017-06-03 DIAGNOSIS — K644 Residual hemorrhoidal skin tags: Secondary | ICD-10-CM | POA: Diagnosis not present

## 2017-06-03 DIAGNOSIS — E785 Hyperlipidemia, unspecified: Secondary | ICD-10-CM | POA: Diagnosis not present

## 2017-06-03 DIAGNOSIS — Z8711 Personal history of peptic ulcer disease: Secondary | ICD-10-CM | POA: Insufficient documentation

## 2017-06-03 DIAGNOSIS — K648 Other hemorrhoids: Secondary | ICD-10-CM | POA: Insufficient documentation

## 2017-06-03 DIAGNOSIS — Z880 Allergy status to penicillin: Secondary | ICD-10-CM | POA: Insufficient documentation

## 2017-06-03 DIAGNOSIS — F17210 Nicotine dependence, cigarettes, uncomplicated: Secondary | ICD-10-CM | POA: Diagnosis not present

## 2017-06-03 DIAGNOSIS — I2510 Atherosclerotic heart disease of native coronary artery without angina pectoris: Secondary | ICD-10-CM | POA: Insufficient documentation

## 2017-06-03 DIAGNOSIS — Z8614 Personal history of Methicillin resistant Staphylococcus aureus infection: Secondary | ICD-10-CM | POA: Insufficient documentation

## 2017-06-03 DIAGNOSIS — K621 Rectal polyp: Secondary | ICD-10-CM | POA: Diagnosis not present

## 2017-06-03 DIAGNOSIS — Z885 Allergy status to narcotic agent status: Secondary | ICD-10-CM | POA: Diagnosis not present

## 2017-06-03 DIAGNOSIS — M1990 Unspecified osteoarthritis, unspecified site: Secondary | ICD-10-CM | POA: Diagnosis not present

## 2017-06-03 HISTORY — PX: BIOPSY: SHX5522

## 2017-06-03 HISTORY — PX: POLYPECTOMY: SHX5525

## 2017-06-03 HISTORY — PX: COLONOSCOPY WITH PROPOFOL: SHX5780

## 2017-06-03 SURGERY — COLONOSCOPY WITH PROPOFOL
Anesthesia: Monitor Anesthesia Care

## 2017-06-03 MED ORDER — PROPOFOL 10 MG/ML IV BOLUS
INTRAVENOUS | Status: DC | PRN
  Administered 2017-06-03: 10 mg via INTRAVENOUS
  Administered 2017-06-03: 30 mg via INTRAVENOUS
  Administered 2017-06-03: 10 mg via INTRAVENOUS

## 2017-06-03 MED ORDER — MIDAZOLAM HCL 2 MG/2ML IJ SOLN
1.0000 mg | INTRAMUSCULAR | Status: AC
  Administered 2017-06-03: 2 mg via INTRAVENOUS
  Filled 2017-06-03: qty 2

## 2017-06-03 MED ORDER — BUDESONIDE 3 MG PO CPEP: 3.0000 mg | ORAL_CAPSULE | Freq: Every day | ORAL | 3 refills | Status: DC

## 2017-06-03 MED ORDER — PROPOFOL 500 MG/50ML IV EMUL
INTRAVENOUS | Status: DC | PRN
  Administered 2017-06-03: 150 ug/kg/min via INTRAVENOUS
  Administered 2017-06-03: 125 ug/kg/min via INTRAVENOUS

## 2017-06-03 MED ORDER — MIDAZOLAM HCL 5 MG/5ML IJ SOLN
INTRAMUSCULAR | Status: DC | PRN
  Administered 2017-06-03: 2 mg via INTRAVENOUS

## 2017-06-03 MED ORDER — CHLORHEXIDINE GLUCONATE CLOTH 2 % EX PADS: 6.0000 | MEDICATED_PAD | Freq: Once | CUTANEOUS | Status: DC

## 2017-06-03 MED ORDER — FENTANYL CITRATE (PF) 100 MCG/2ML IJ SOLN
INTRAMUSCULAR | Status: AC
  Filled 2017-06-03: qty 2

## 2017-06-03 MED ORDER — MIDAZOLAM HCL 2 MG/2ML IJ SOLN
INTRAMUSCULAR | Status: AC
  Filled 2017-06-03: qty 2

## 2017-06-03 MED ORDER — IPRATROPIUM-ALBUTEROL 0.5-2.5 (3) MG/3ML IN SOLN
3.0000 mL | Freq: Once | RESPIRATORY_TRACT | Status: AC
  Administered 2017-06-03: 3 mL via RESPIRATORY_TRACT
  Filled 2017-06-03: qty 3

## 2017-06-03 MED ORDER — FENTANYL CITRATE (PF) 100 MCG/2ML IJ SOLN
25.0000 ug | Freq: Once | INTRAMUSCULAR | Status: AC
  Administered 2017-06-03: 25 ug via INTRAVENOUS

## 2017-06-03 MED ORDER — LACTATED RINGERS IV SOLN
INTRAVENOUS | Status: DC
  Administered 2017-06-03: 09:00:00 via INTRAVENOUS

## 2017-06-03 NOTE — Op Note (Signed)
[facility] Patient Name: Garrett Klein Procedure Date: 06/03/2017 8:43 AM MRN: [id number] Date of Birth: Nov 30, 1955 Attending MD: Barney Drain , MD CSN: [id number] Age: 61 Admit Type: Outpatient Procedure:                Colonoscopy with cold forceps biopsy/polypectomy &                            SNARE POLYPECTOMY Indications:              High risk colon cancer surveillance: Personal                            history of colonic polyps Providers:                Barney Drain, MD, Otis Peak B. Sharon Seller, RN, Aram Candela Referring MD:             Fuller Canada Manuella Ghazi MD, MD Medicines:                Propofol per Anesthesia Complications:            No immediate complications. Estimated Blood Loss:     Estimated blood loss was minimal. Procedure:                Pre-Anesthesia Assessment:                           - Prior to the procedure, a History and Physical                            was performed, and patient medications and                            allergies were reviewed. The patient's tolerance of                            previous anesthesia was also reviewed. The risks                            and benefits of the procedure and the sedation                            options and risks were discussed with the patient.                            All questions were answered, and informed consent                            was obtained. Prior Anticoagulants: The patient has                            taken no previous anticoagulant or antiplatelet  agents. ASA Grade Assessment: II - A patient with                            mild systemic disease. After reviewing the risks                            and benefits, the patient was deemed in                            satisfactory condition to undergo the procedure.                            After obtaining informed consent, the colonoscope                            was passed  under direct vision. Throughout the                            procedure, the patient's blood pressure, pulse, and                            oxygen saturations were monitored continuously. The                            EC-[id number] ([id number]) scope was introduced through                            the anus and advanced to the the cecum, identified                            by appendiceal orifice and ileocecal valve. The                            patient tolerated the procedure well. The quality                            of the bowel preparation was excellent. The                            ileocecal valve, appendiceal orifice, and rectum                            were photographed. The colonoscopy was somewhat                            difficult due to a tortuous colon. Successful                            completion of the procedure was aided by COLOWRAP. Scope In: 9:36:43 AM Scope Out: 10:00:10 AM Scope Withdrawal Time: 0 hours 21 minutes 12 seconds  Total Procedure Duration: 0 hours 23 minutes 27 seconds  Findings:      Multiple small and large-mouthed diverticula were found in the  recto-sigmoid colon, sigmoid colon, descending colon, splenic flexure       and transverse colon.      Two sessile polyps were found in the mid transverse colon and cecum. The       polyps were 2 to 3 mm in size. These polyps were removed with a cold       biopsy forceps. Resection and retrieval were complete.      Three sessile polyps were found in the rectum(2: 1 NOT RETRIEVED) and       mid descending colon. The polyps were 4 to 6 mm in size. These polyps       were removed with a hot snare. Resection and retrieval were complete.      External and internal hemorrhoids were found during retroflexion. The       hemorrhoids were moderate.      The recto-sigmoid colon and sigmoid colon were moderately redundant. Impression:               - Diverticulosis in the recto-sigmoid colon, in the                             sigmoid colon, in the descending colon, at the                            splenic flexure and in the transverse colon.                           - Three 2 to 3 mm polyps in the mid transverse                            colon and in the cecum, removed with a cold biopsy                            forceps. Resected and retrieved.                           - Three 4 to 6 mm polyps in the rectum and in the                            mid descending colon, removed with a hot snare.                            Resected and retrieved.                           - External and internal hemorrhoids.                           - Redundant LEFT colon. Moderate Sedation:      Per Anesthesia Care Recommendation:           - Resume previous diet.                           - Continue present medications.                           -  Await pathology results.                           - Repeat colonoscopy in 3 - 5 years for                            surveillance.                           - Return to my office in 2 months.                           - Patient has a contact number available for                            emergencies. The signs and symptoms of potential                            delayed complications were discussed with the                            patient. Return to normal activities tomorrow.                            Written discharge instructions were provided to the                            patient. Procedure Code(s):        --- Professional ---                           [phone], Colonoscopy, flexible; with removal of                            tumor(s), polyp(s), or other lesion(s) by snare                            technique                           45380, 75, Colonoscopy, flexible; with biopsy,                            single or multiple Diagnosis Code(s):        --- Professional ---                           Z86.010, Personal history of colonic polyps                            K64.8, Other hemorrhoids                           D12.3, Benign neoplasm of transverse colon (hepatic                            flexure or  splenic flexure)                           D12.0, Benign neoplasm of cecum                           K62.1, Rectal polyp                           D12.4, Benign neoplasm of descending colon                           K57.30, Diverticulosis of large intestine without                            perforation or abscess without bleeding                           Q43.8, Other specified congenital malformations of                            intestine CPT copyright 2016 [employer]. All rights reserved. The codes documented in this report are preliminary and upon coder review may  be revised to meet current compliance requirements. Barney Drain, MD Barney Drain, MD 06/03/2017 10:19:30 AM This report has been signed electronically. Number of Addenda: 0

## 2017-06-03 NOTE — Discharge Instructions (Signed)
You have moderate external and internal hemorrhoids, WHICH can CAUSE RECTAL BLEEDING. YOU HAVE diverticulosis IN YOUR RIGHT AND LEFT COLON. YOU HAD FIVE SMALL POLYPS REMOVED.     DRINK WATER TO KEEP YOUR URINE LIGHT YELLOW.  FOLLOW A HIGH FIBER DIET. AVOID ITEMS THAT CAUSE BLOATING. See info below.  USE PREPARATION H FOUR TIMES  A DAY IF NEEDED TO RELIEVE RECTAL PAIN/PRESSURE/BLEEDING.   YOUR BIOPSY RESULTS WILL BE AVAILABLE IN MY CHART  Jul 20 AND MY OFFICE WILL CONTACT YOU IN 10-14 DAYS WITH YOUR RESULTS.   FOLLOW UP IN 2 MOS.   Next colonoscopy in 3-5 years.  Colonoscopy Care After Read the instructions outlined below and refer to this sheet in the next week. These discharge instructions provide you with general information on caring for yourself after you leave the hospital. While your treatment has been planned according to the most current medical practices available, unavoidable complications occasionally occur. If you have any problems or questions after discharge, call DR. Haille Pardi, [phone].  ACTIVITY  You may resume your regular activity, but move at a slower pace for the next 24 hours.   Take frequent rest periods for the next 24 hours.   Walking will help get rid of the air and reduce the bloated feeling in your belly (abdomen).   No driving for 24 hours (because of the medicine (anesthesia) used during the test).   You may shower.   Do not sign any important legal documents or operate any machinery for 24 hours (because of the anesthesia used during the test).    NUTRITION  Drink plenty of fluids.   You may resume your normal diet as instructed by your doctor.   Begin with a light meal and progress to your normal diet. Heavy or fried foods are harder to digest and may make you feel sick to your stomach (nauseated).   Avoid alcoholic beverages for 24 hours or as instructed.    MEDICATIONS  You may resume your normal medications.   WHAT YOU CAN EXPECT  TODAY  Some feelings of bloating in the abdomen.   Passage of more gas than usual.   Spotting of blood in your stool or on the toilet paper  .  IF YOU HAD POLYPS REMOVED DURING THE COLONOSCOPY:  Eat a soft diet IF YOU HAVE NAUSEA, BLOATING, ABDOMINAL PAIN, OR VOMITING.    FINDING OUT THE RESULTS OF YOUR TEST Not all test results are available during your visit. DR. Oneida Alar WILL CALL YOU WITHIN 14 DAYS OF YOUR PROCEDUE WITH YOUR RESULTS. Do not assume everything is normal if you have not heard from DR. Kairie Vangieson, CALL HER OFFICE AT [phone].  SEEK IMMEDIATE MEDICAL ATTENTION AND CALL THE OFFICE: [phone] IF:  You have more than a spotting of blood in your stool.   Your belly is swollen (abdominal distention).   You are nauseated or vomiting.   You have a temperature over 101F.   You have abdominal pain or discomfort that is severe or gets worse throughout the day.  High-Fiber Diet A high-fiber diet changes your normal diet to include more whole grains, legumes, fruits, and vegetables. Changes in the diet involve replacing refined carbohydrates with unrefined foods. The calorie level of the diet is essentially unchanged. The Dietary Reference Intake (recommended amount) for adult males is 38 grams per day. For adult females, it is 25 grams per day. Pregnant and lactating women should consume 28 grams of fiber per day. Fiber is the intact part  of a plant that is not broken down during digestion. Functional fiber is fiber that has been isolated from the plant to provide a beneficial effect in the body. PURPOSE  Increase stool bulk.   Ease and regulate bowel movements.   Lower cholesterol.  REDUCE RISK OF COLON CANCER  INDICATIONS THAT YOU NEED MORE FIBER  Constipation and hemorrhoids.   Uncomplicated diverticulosis (intestine condition) and irritable bowel syndrome.   Weight management.   As a protective measure against hardening of the arteries (atherosclerosis),  diabetes, and cancer.   GUIDELINES FOR INCREASING FIBER IN THE DIET  Start adding fiber to the diet slowly. A gradual increase of about 5 more grams (2 slices of whole-wheat bread, 2 servings of most fruits or vegetables, or 1 bowl of high-fiber cereal) per day is best. Too rapid an increase in fiber may result in constipation, flatulence, and bloating.   Drink enough water and fluids to keep your urine clear or pale yellow. Water, juice, or caffeine-free drinks are recommended. Not drinking enough fluid may cause constipation.   Eat a variety of high-fiber foods rather than one type of fiber.   Try to increase your intake of fiber through using high-fiber foods rather than fiber pills or supplements that contain small amounts of fiber.   The goal is to change the types of food eaten. Do not supplement your present diet with high-fiber foods, but replace foods in your present diet.   INCLUDE A VARIETY OF FIBER SOURCES  Replace refined and processed grains with whole grains, canned fruits with fresh fruits, and incorporate other fiber sources. White rice, white breads, and most bakery goods contain little or no fiber.   Brown whole-grain rice, buckwheat oats, and many fruits and vegetables are all good sources of fiber. These include: broccoli, Brussels sprouts, cabbage, cauliflower, beets, sweet potatoes, white potatoes (skin on), carrots, tomatoes, eggplant, squash, berries, fresh fruits, and dried fruits.   Cereals appear to be the richest source of fiber. Cereal fiber is found in whole grains and bran. Bran is the fiber-rich outer coat of cereal grain, which is largely removed in refining. In whole-grain cereals, the bran remains. In breakfast cereals, the largest amount of fiber is found in those with "bran" in their names. The fiber content is sometimes indicated on the label.   You may need to include additional fruits and vegetables each day.   In baking, for 1 cup white flour, you may  use the following substitutions:   1 cup whole-wheat flour minus 2 tablespoons.   1/2 cup white flour plus 1/2 cup whole-wheat flour.   Polyps, Colon  A polyp is extra tissue that grows inside your body. Colon polyps grow in the large intestine. The large intestine, also called the colon, is part of your digestive system. It is a long, hollow tube at the end of your digestive tract where your body makes and stores stool. Most polyps are not dangerous. They are benign. This means they are not cancerous. But over time, some types of polyps can turn into cancer. Polyps that are smaller than a pea are usually not harmful. But larger polyps could someday become or may already be cancerous. To be safe, doctors remove all polyps and test them.   PREVENTION There is not one sure way to prevent polyps. You might be able to lower your risk of getting them if you:  Eat more fruits and vegetables and less fatty food.   Do not smoke.  Avoid alcohol.   Exercise every day.   Lose weight if you are overweight.   Eating more calcium and folate can also lower your risk of getting polyps. Some foods that are rich in calcium are milk, cheese, and broccoli. Some foods that are rich in folate are chickpeas, kidney beans, and spinach.    Diverticulosis Diverticulosis is a common condition that develops when small pouches (diverticula) form in the wall of the colon. The risk of diverticulosis increases with age. It happens more often in people who eat a low-fiber diet. Most individuals with diverticulosis have no symptoms. Those individuals with symptoms usually experience belly (abdominal) pain, constipation, or loose stools (diarrhea).  HOME CARE INSTRUCTIONS  Increase the amount of fiber in your diet as directed by your caregiver or dietician. This may reduce symptoms of diverticulosis.   Drink at least 6 to 8 glasses of water each day to prevent constipation.   Try not to strain when you have a bowel  movement.   Avoiding nuts and seeds to prevent complications is NOT NECESSARY.     FOODS HAVING HIGH FIBER CONTENT INCLUDE:  Fruits. Apple, peach, pear, tangerine, raisins, prunes.   Vegetables. Brussels sprouts, asparagus, broccoli, cabbage, carrot, cauliflower, romaine lettuce, spinach, summer squash, tomato, winter squash, zucchini.   Starchy Vegetables. Baked beans, kidney beans, lima beans, split peas, lentils, potatoes (with skin).   Grains. Whole wheat bread, brown rice, bran flake cereal, plain oatmeal, white rice, shredded wheat, bran muffins.    SEEK IMMEDIATE MEDICAL CARE IF:  You develop increasing pain or severe bloating.   You have an oral temperature above 101F.   You develop vomiting or bowel movements that are bloody or black.   Hemorrhoids Hemorrhoids are dilated (enlarged) veins around the rectum. Sometimes clots will form in the veins. This makes them swollen and painful. These are called thrombosed hemorrhoids. Causes of hemorrhoids include:  Constipation.   Straining to have a bowel movement.   HEAVY LIFTING  HOME CARE INSTRUCTIONS  Eat a well balanced diet and drink 6 to 8 glasses of water every day to avoid constipation. You may also use a bulk laxative.   Avoid straining to have bowel movements.   Keep anal area dry and clean.   Do not use a donut shaped pillow or sit on the toilet for long periods. This increases blood pooling and pain.   Move your bowels when your body has the urge; this will require less straining and will decrease pain and pressure.

## 2017-06-03 NOTE — Anesthesia Preprocedure Evaluation (Signed)
Anesthesia Evaluation  Patient identified by MRN, date of birth, ID band Patient awake    Reviewed: Allergy & Precautions, NPO status , Patient's Chart, lab work & pertinent test results  Airway Mallampati: II  TM Distance: >3 FB Neck ROM: Full    Dental  (+) Edentulous Upper, Edentulous Lower   Pulmonary pneumonia, resolved, COPD,  COPD inhaler, Current Smoker,    breath sounds clear to auscultation       Cardiovascular hypertension, Pt. on medications + CAD   Rhythm:Regular Rate:Normal     Neuro/Psych    GI/Hepatic PUD,   Endo/Other    Renal/GU      Musculoskeletal  (+) Arthritis ,   Abdominal   Peds  Hematology   Anesthesia Other Findings   Reproductive/Obstetrics                             Anesthesia Physical Anesthesia Plan  ASA: III  Anesthesia Plan: MAC   Post-op Pain Management:    Induction: Intravenous  PONV Risk Score and Plan:   Airway Management Planned: Simple Face Mask  Additional Equipment:   Intra-op Plan:   Post-operative Plan:   Informed Consent: I have reviewed the patients History and Physical, chart, labs and discussed the procedure including the risks, benefits and alternatives for the proposed anesthesia with the patient or authorized representative who has indicated his/her understanding and acceptance.     Plan Discussed with:   Anesthesia Plan Comments:         Anesthesia Quick Evaluation

## 2017-06-03 NOTE — H&P (Signed)
Primary Care Physician:  [name], MD Primary Gastroenterologist:  Dr. Oneida Alar  Pre-Procedure History & Physical: HPI:  Garrett Klein is a 61 y.o. male here for PERSONAL HISTORY OF POLYPS: serrated adenoma 2011/pmhX; collagenous colitis.  Past Medical History:  Diagnosis Date  . Chronic lower back pain   . Collagenous colitis [address] [place]  . COPD (chronic obstructive pulmonary disease) ([place])   . Essential hypertension   . Hyperlipidemia   . MRSA infection   . PUD (peptic ulcer disease)     Past Surgical History:  Procedure Laterality Date  . BACK SURGERY    . COLONOSCOPY  2001   Dr. Sharlett Iles, normal  . COLONOSCOPY  04/2013   [facility]: Fentanyl 240m/versed 150m sigmoid diverticulosis, normal terminal ileum, small internal hemorrhoids, random colon bx: collagenous colitis. next tcs 04/2018  . [facility]  [facility], sess[facility]err[facility] aden[facility], mild diverticulosis  . ELBOW BURSA SURGERY    . ESOPHAGEAL DILATION N/A 05/19/2015   Procedure: ESOPHAGEAL DILATION;  Surgeon: SaDanie BinderMD;  [facility];  Service: Endoscopy;  Laterality: N/A;  . ESOPHAGOGASTRODUODENOSCOPY N/A 10/28/2014   [facility]: 1. Esophagitis due to GERD. KOH neg. 2. Small hiatal hernia 3. Moderate erosive gastritis 4. RUQ pain due to large ulcer 5. MIUllinuodentis in the bulb.   . ESOPHAGOGASTRODUODENOSCOPY N/A 05/19/2015   [facility]: 1. Mild distal esophagitis 2. Peptic stricture at the gastroesophageal junction 3. Mild non-erosive gastritis 4. Pseudo pylorus due to prior PUD.   . Marland KitchenSOPHAGOGASTRODUODENOSCOPY N/A 03/07/2017   Procedure: ESOPHAGOGASTRODUODENOSCOPY (EGD);  Surgeon: SaDanie BinderMD;  Location[facility];  Service: Endoscopy;  Laterality: N/A;  200  . FINGER AMPUTATION Left 1985   index finger  . JOINT REPLACEMENT     left hip surgery - January 26, 2014  . ROTATOR CUFF REPAIR     November 2014  . SAVORY DILATION N/A 03/07/2017   Procedure: SAVORY DILATION;  Surgeon:  SaDanie BinderMD;  Location: [facility];  Service: Endoscopy;  Laterality: N/A;    Prior to Admission medications   Medication Sig Start Date End Date Taking? Authorizing Provider  albuterol (PROVENTIL HFA;VENTOLIN HFA) 108 (90 BASE) MCG/ACT inhaler Inhale 2 puffs into the lungs every 6 (six) hours as needed for wheezing or shortness of breath.   Yes [provider]  albuterol (PROVENTIL) (2.5 MG/3ML) 0.083% nebulizer solution Take 2.5 mg by nebulization every 6 (six) hours as needed for wheezing or shortness of breath.   Yes [provider]  atorvastatin (LIPITOR) 80 MG tablet Take 80 mg by mouth every evening.    Yes [provider]  budesonide (ENTOCORT EC) 3 MG 24 hr capsule 2 PO DAILY FOR 1 MO THEN 1 PO DAILY Patient taking differently: Take 3 mg by mouth daily.  03/06/17  Yes Blayze Haen, SaMarga MelnickMD  budesonide-formoterol (SYMBICORT) 160-4.5 MCG/ACT inhaler Inhale 2 puffs into the lungs 2 (two) times daily.   Yes [provider]  cholecalciferol (VITAMIN D) 1000 UNITS tablet Take 4,000 Units by mouth daily.    Yes [provider]  Glucosamine HCl (GLUCOSAMINE PO) Take 1 capsule by mouth daily.   Yes [provider]  lisinopril-hydrochlorothiazide (PRINZIDE,ZESTORETIC) 10-12.5 MG tablet Take 1 tablet by mouth daily.   Yes [provider]  nicotine (NICODERM CQ - DOSED IN MG/24 HOURS) 21 mg/24hr patch Place 21 mg onto the skin daily as needed (for smoking cessation.).  Yes [provider]  omeprazole (PRILOSEC) 20 MG capsule 1 PO 30 MINS BEFORE YOUR FIRST MEAL EVERY DAY FOREVER Patient taking differently: Take 20 mg by mouth daily before breakfast. 30 minutes before breakfast 03/07/17  Yes Freida Nebel L, MD  peg 3350 powder (MOVIPREP) 100 g SOLR Take 1 kit (200 g total) by mouth as directed. 04/10/17  Yes Leary Mcnulty L, MD  sildenafil (VIAGRA) 100 MG tablet Take 100 mg by mouth daily as needed for erectile dysfunction.    Yes [provider]  umeclidinium bromide (INCRUSE ELLIPTA) 62.5 MCG/INH AEPB Inhale 1 puff into the lungs every morning.    Yes [provider]  EPINEPHrine 0.3 mg/0.3 mL IJ SOAJ injection Inject 0.3 mg into the muscle as needed (allergic reaction).    [provider]    Allergies as of 04/10/2017 - Review Complete 04/10/2017  Allergen Reaction Noted  . Bee venom Swelling 06/21/2012  . Ciprofloxacin Other (See Comments) 08/06/2016  . Hydrocodone  03/28/2013  . Levaquin [levofloxacin in d5w]  12/11/2016  . Penicillins Other (See Comments) 12/19/2010  . Tramadol  10/18/2014  . Sulfa antibiotics Rash 03/28/2013    Family History  Problem Relation Age of Onset  . Cancer Mother   . Diabetes type II Father   . Heart attack Father   . Arthritis Sister   . Stroke Brother   . Coronary artery disease Brother   . Diabetes Other   . Heart attack Unknown   . Colon cancer Neg Hx   . Liver disease Neg Hx   . Ulcers Neg Hx     Social History   Social History  . Marital status: Married    Spouse name: N/A  . Number of children: N/A  . Years of education: N/A   Occupational History  . Not on file.   Social History Main Topics  . Smoking status: Current Every Day Smoker    Packs/day: 1.00    Years: 40.00    Types: Cigarettes    Start date: 01/09/1969  . Smokeless tobacco: Never Used  . Alcohol use No  . Drug use: No  . Sexual activity: Not on file   Other Topics Concern  . Not on file   Social History Narrative  . No narrative on file    Review of Systems: See HPI, otherwise negative ROS   Physical Exam: BP (!) 158/80   Pulse 71   Temp 98.1 F (36.7 C) (Oral)   Resp 20   SpO2 97%  General:   Alert,  pleasant and cooperative in NAD Head:  Normocephalic and atraumatic. Neck:  Supple; Lungs:  Clear throughout to auscultation.    Heart:  Regular rate and rhythm. Abdomen:  Soft, nontender and nondistended. Normal bowel sounds, without  guarding, and without rebound.   Neurologic:  Alert and  oriented x4;  grossly normal neurologically.  Impression/Plan:     PERSONAL HISTORY OF POLYPS: serrated adenoma 2011.  PLAN: 1. TCS TODAY. DISCUSSED PROCEDURE, BENEFITS, & RISKS: < 1% chance of medication reaction, bleeding, perforation, or rupture of spleen/liver.

## 2017-06-03 NOTE — Transfer of Care (Signed)
Immediate Anesthesia Transfer of Care Note  Patient: Garrett Klein  Procedure(s) Performed: Procedure(s) with comments: COLONOSCOPY WITH PROPOFOL (N/A) - 10:00am BIOPSY - colon POLYPECTOMY - colon  Patient Location: PACU  Anesthesia Type:MAC  Level of Consciousness: awake and patient cooperative  Airway & Oxygen Therapy: Patient Spontanous Breathing  Post-op Assessment: Report given to RN and Post -op Vital signs reviewed and stable  Post vital signs: Reviewed and stable  Last Vitals:  Vitals:   06/03/17 0900 06/03/17 0905  BP: 119/72 118/73  Pulse:    Resp: (!) 21 16  Temp:      Last Pain:  Vitals:   06/03/17 0811  TempSrc: Oral         Complications: No apparent anesthesia complications

## 2017-06-03 NOTE — Anesthesia Postprocedure Evaluation (Signed)
Anesthesia Post Note  Patient: Garrett Klein  Procedure(s) Performed: Procedure(s) (LRB): COLONOSCOPY WITH PROPOFOL (N/A) BIOPSY POLYPECTOMY  Patient location during evaluation: PACU Anesthesia Type: MAC Level of consciousness: awake and alert and oriented Pain management: pain level controlled Vital Signs Assessment: post-procedure vital signs reviewed and stable Respiratory status: spontaneous breathing Cardiovascular status: blood pressure returned to baseline Postop Assessment: no signs of nausea or vomiting Anesthetic complications: no     Last Vitals:  Vitals:   06/03/17 1015 06/03/17 1030  BP: 112/71 127/74  Pulse: 76 67  Resp: 20 20  Temp:      Last Pain:  Vitals:   06/03/17 0811  TempSrc: Oral                 Ebon Ketchum

## 2017-06-04 ENCOUNTER — Telehealth: Payer: Self-pay | Admitting: Gastroenterology

## 2017-06-04 DIAGNOSIS — R109 Unspecified abdominal pain: Secondary | ICD-10-CM | POA: Insufficient documentation

## 2017-06-04 NOTE — Telephone Encounter (Addendum)
Please call pt. He had THREE HYPERPLASTIC POLYPS AND 2 BENIGN POLYPOID LESIONS removed. THE COLON BIOPSIES SHOW THAT THE ENTOCORT HAS RESOLVED YOUR COLLAGENOUS COLITIS. IT IS TIME TO TAPER THE ENTOCORT.   Dr.  Kerby Moors A CT SCAN OF THE ABD/PELVIS w/ iv/oral contrast to COMPLETE THE EVALUATION OF YOUR ABDOMINAL PAIN.   ON JUL 19 BEGIN ENTOCORT 3 MG EVERY OTHER DAY FOR ONE MONTH THEN ONE PILL EVERY 3 DAYS FOR ONE MONTH, THEN STOP.  DRINK WATER TO KEEP YOUR URINE LIGHT YELLOW.  FOLLOW A HIGH FIBER DIET. AVOID ITEMS THAT CAUSE BLOATING.   USE PREPARATION H FOUR TIMES  A DAY IF NEEDED TO RELIEVE RECTAL PAIN/PRESSURE/BLEEDING.   FOLLOW UP IN 2 MOS E30 DYSPHAGIA/ABDOMINAL PAIN.   Next colonoscopy in 5 years.

## 2017-06-04 NOTE — Assessment & Plan Note (Addendum)
EVALUATE FOR OCCULT MALIGNANCY BUT MOST LIKELY DUE TO FUNCTIONAL ABDOMINAL PAIN/ANXIETY.

## 2017-06-05 ENCOUNTER — Other Ambulatory Visit (HOSPITAL_COMMUNITY): Payer: Medicare Other

## 2017-06-05 ENCOUNTER — Encounter (HOSPITAL_COMMUNITY): Payer: Self-pay | Admitting: Gastroenterology

## 2017-06-05 ENCOUNTER — Encounter: Payer: Self-pay | Admitting: Gastroenterology

## 2017-06-05 NOTE — Telephone Encounter (Signed)
Wife was also informed of f/u appt with [facility].

## 2017-06-05 NOTE — Telephone Encounter (Signed)
Tried to call with no answer  

## 2017-06-05 NOTE — Telephone Encounter (Signed)
Called and spoke to pt's wife. Informed her of info from [facility]. He can't have CT 06/12/17 d/t his wife has an appt that day. Gave her phone number to central scheduling to reschedule CT. She wrote down Entocort instructions.

## 2017-06-05 NOTE — Telephone Encounter (Signed)
OV made and letter mailed °

## 2017-06-05 NOTE — Telephone Encounter (Signed)
Pt is set up for CT scan on 06/12/17 @ 5:00 pm

## 2017-06-12 ENCOUNTER — Ambulatory Visit (HOSPITAL_COMMUNITY): Payer: Medicare Other

## 2017-06-13 NOTE — Addendum Note (Signed)
Addendum  created 06/13/17 0727 by Vista Deck, CRNA   Anesthesia Attestations filed, Anesthesia Event edited

## 2017-06-16 ENCOUNTER — Ambulatory Visit (HOSPITAL_COMMUNITY)
Admission: RE | Admit: 2017-06-16 | Discharge: 2017-06-16 | Disposition: A | Discharge status: Home / Self Care | Payer: Medicare Other | Source: Ambulatory Visit | Attending: Gastroenterology | Admitting: Gastroenterology

## 2017-06-16 DIAGNOSIS — R109 Unspecified abdominal pain: Secondary | ICD-10-CM | POA: Diagnosis present

## 2017-06-16 DIAGNOSIS — K5730 Diverticulosis of large intestine without perforation or abscess without bleeding: Secondary | ICD-10-CM | POA: Insufficient documentation

## 2017-06-16 DIAGNOSIS — I714 Abdominal aortic aneurysm, without rupture: Secondary | ICD-10-CM | POA: Diagnosis not present

## 2017-06-16 LAB — POCT I-STAT CREATININE: Creatinine, Ser: 1 mg/dL (ref 0.61–1.24)

## 2017-06-16 MED ORDER — IOPAMIDOL (ISOVUE-300) INJECTION 61%
100.0000 mL | Freq: Once | INTRAVENOUS | Status: AC | PRN
  Administered 2017-06-16: 100 mL via INTRAVENOUS

## 2017-06-17 NOTE — Progress Notes (Signed)
Pt is aware.  

## 2017-06-24 IMAGING — CT CT CHEST LUNG CANCER SCREENING LOW DOSE W/O CM
2 of 4 series · 15 of 40 positions shown, 18 images · non-contrast
Comparison: Chest radiograph of 11/09/2016. Diagnostic CT of
08/02/2015. No prior screening CT.

CLINICAL DATA: Forty pack-year smoker.  Asymptomatic.

EXAM:
CT CHEST WITHOUT CONTRAST LOW-DOSE FOR LUNG CANCER SCREENING
TECHNIQUE: Multidetector CT imaging of the chest was performed following the
standard protocol without IV contrast.

[Series 2: axial st · axial · 0.68mm/px · z∈[+1289,+1549]mm · 12 of 62 slices shown, 15 images]
[im 5/62  mediastinal]
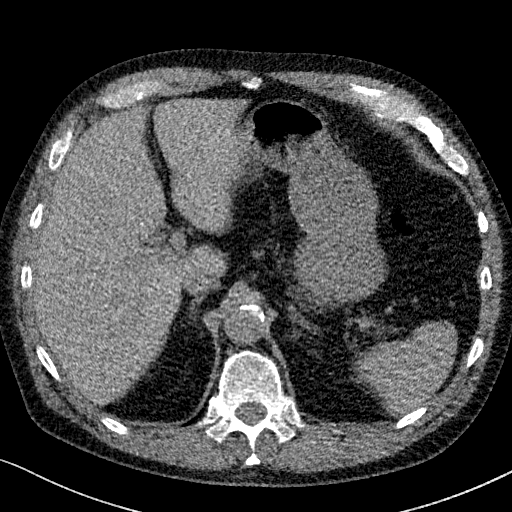
[im 5/62  lung]
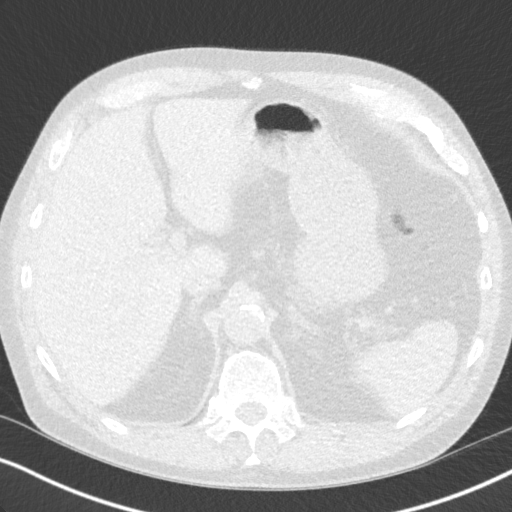
[im 10/62  lung]
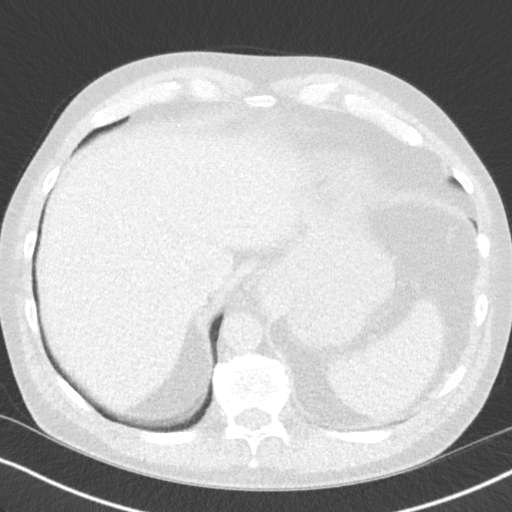
[im 15/62  lung]
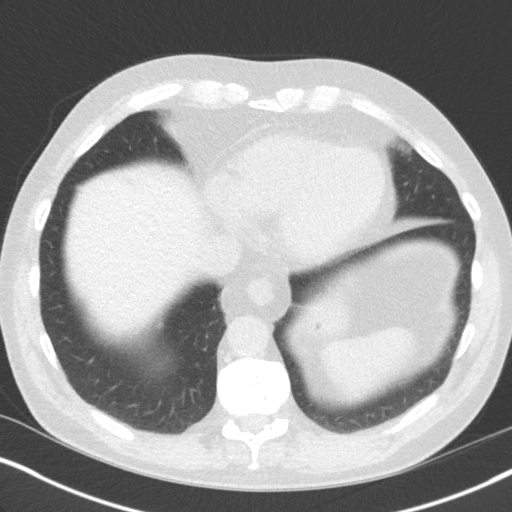
[im 19/62  lung]
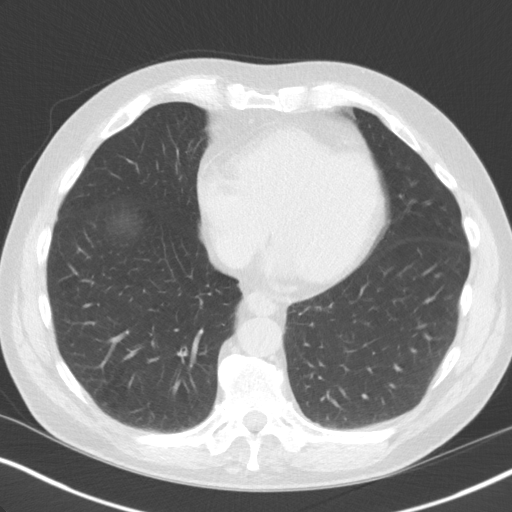
[im 24/62  mediastinal]
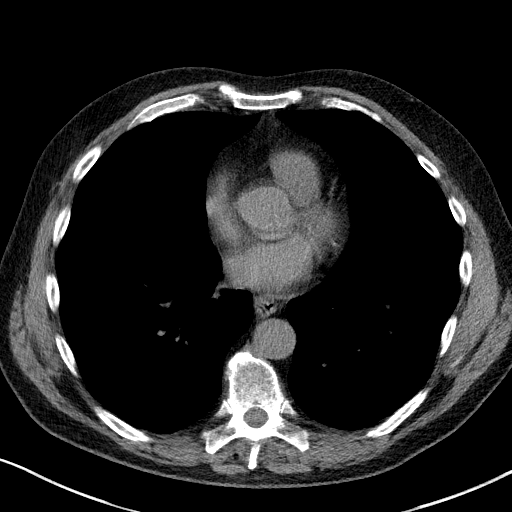
[im 24/62  lung]
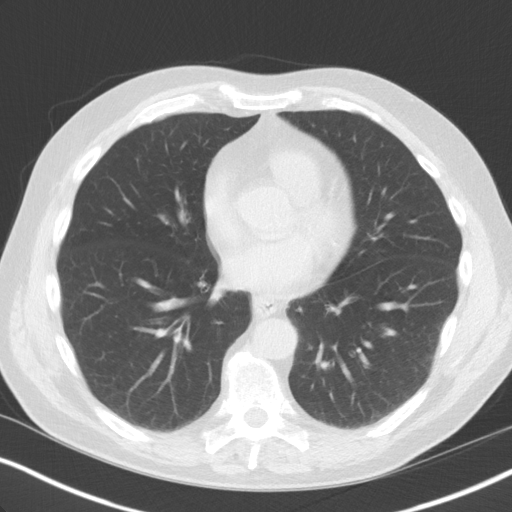
[im 29/62  lung]
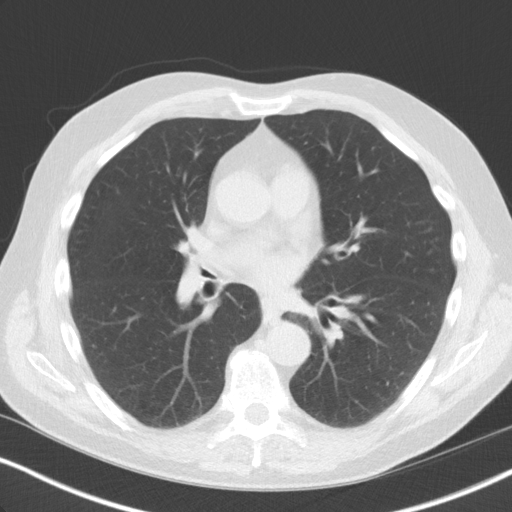
[im 33/62  lung]
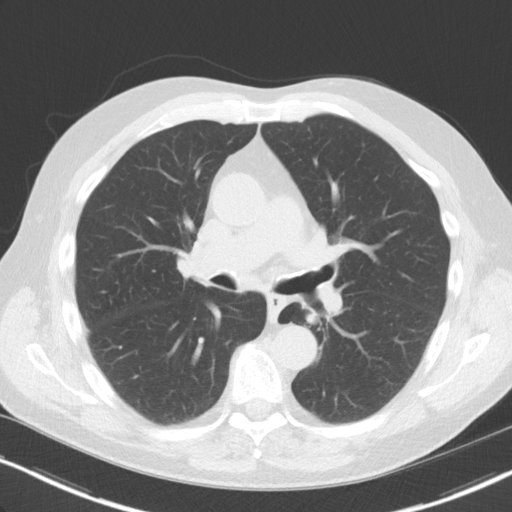
[im 38/62  lung]
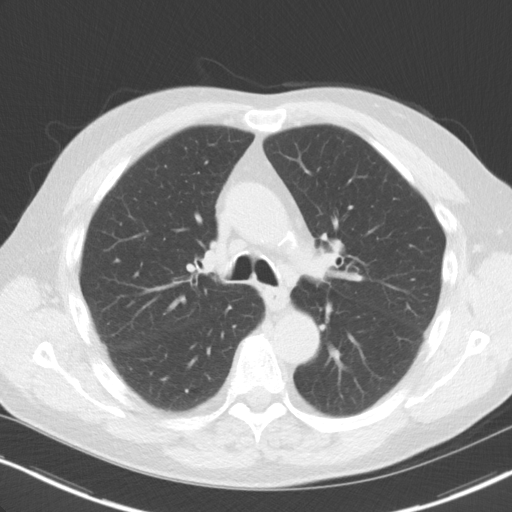
[im 43/62  mediastinal]
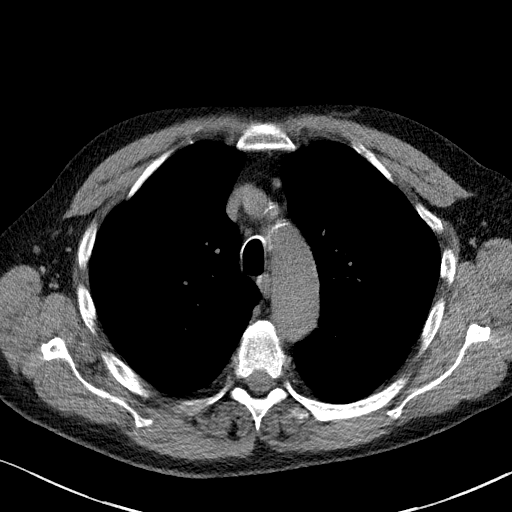
[im 43/62  lung]
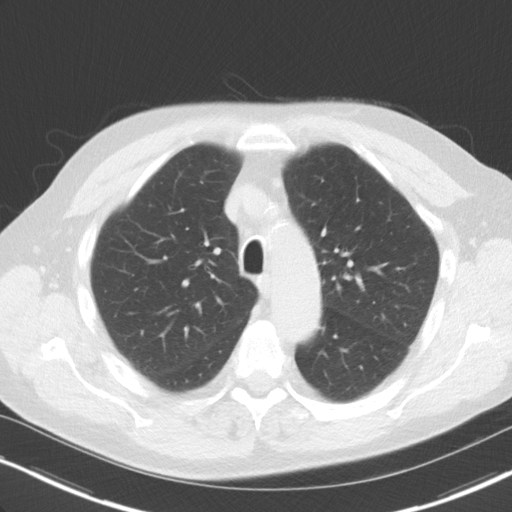
[im 47/62  lung]
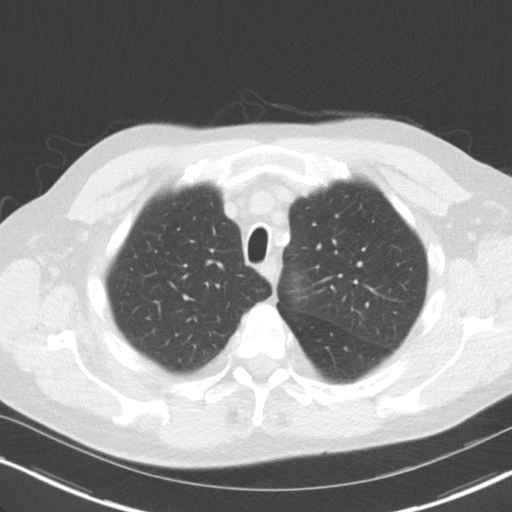
[im 52/62  lung]
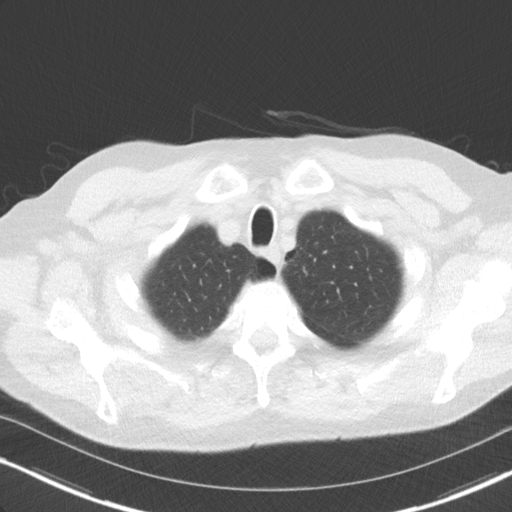
[im 57/62  lung]
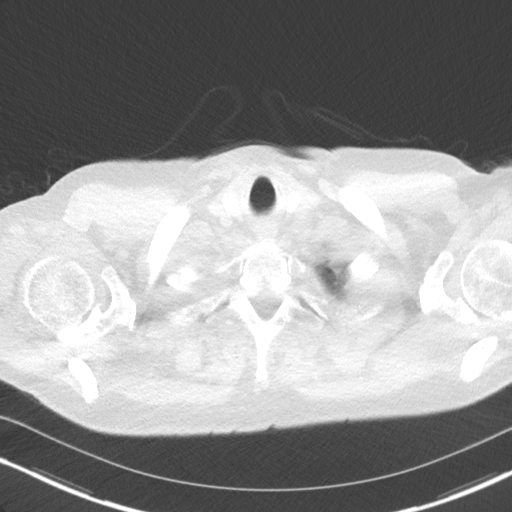

[Series 5: coronal · coronal · 0.63mm/px · 3 of 287 slices shown]
[im 58/287  lung]
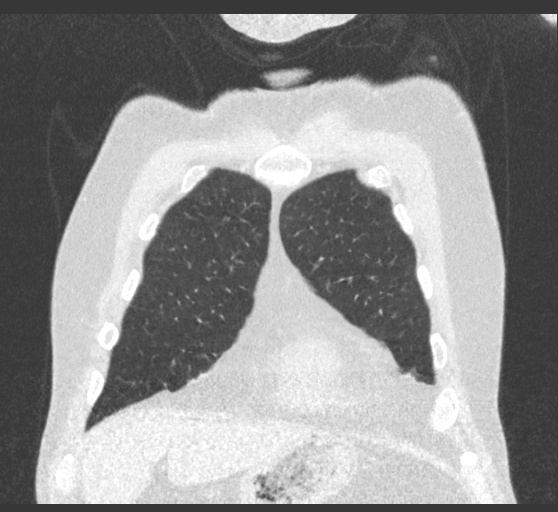
[im 115/287  lung]
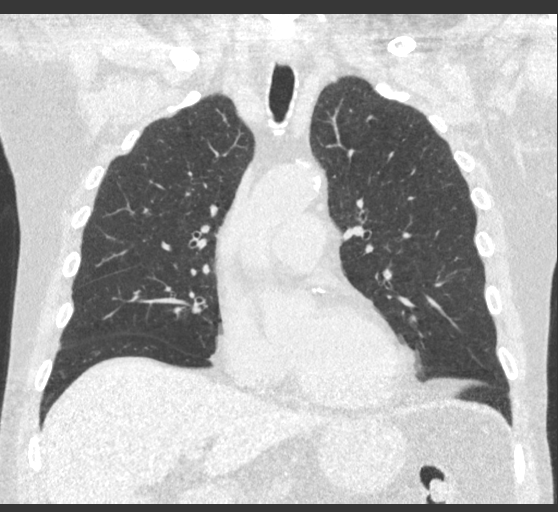
[im 172/287  lung]
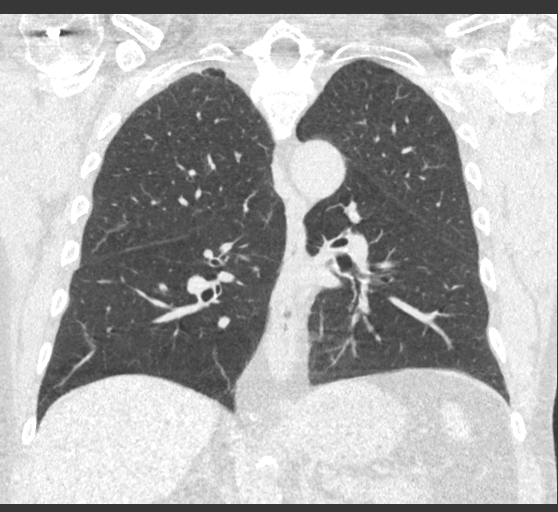

[15 of 40 positions shown; findings below may reference images not displayed]

FINDINGS: Cardiovascular: Aortic and branch vessel atherosclerosis. Tortuous
thoracic aorta. Borderline cardiomegaly, without pericardial
effusion. Multivessel coronary artery atherosclerosis.

Mediastinum/Nodes: Borderline enlarged left paratracheal node at 10
mm on image 25/series 2. This was similar back in 2950, favoring a
benign etiology. Hilar regions poorly evaluated without intravenous
contrast.

Lungs/Pleura: No pleural fluid. Moderate centrilobular emphysema. No
suspicious pulmonary nodule or mass.

Upper Abdomen: Normal imaged portions of the liver, spleen, stomach,
pancreas, adrenal glands, kidneys.

Musculoskeletal: Right rotator cuff repair. No acute osseous
abnormality.
IMPRESSION: 1. Lung-RADS 1, negative. Continue annual screening with low-dose
chest CT without contrast in 12 months.
2.  Coronary artery atherosclerosis. Aortic atherosclerosis.

## 2017-08-13 ENCOUNTER — Encounter: Payer: Self-pay | Admitting: Gastroenterology

## 2017-08-13 ENCOUNTER — Ambulatory Visit (INDEPENDENT_AMBULATORY_CARE_PROVIDER_SITE_OTHER): Payer: Medicare Other | Admitting: Gastroenterology

## 2017-08-13 ENCOUNTER — Telehealth: Payer: Self-pay

## 2017-08-13 DIAGNOSIS — K52831 Collagenous colitis: Secondary | ICD-10-CM | POA: Diagnosis not present

## 2017-08-13 DIAGNOSIS — R1312 Dysphagia, oropharyngeal phase: Secondary | ICD-10-CM | POA: Diagnosis not present

## 2017-08-13 MED ORDER — ALPRAZOLAM 1 MG PO TABS: ORAL_TABLET | ORAL | 2 refills | Status: DC

## 2017-08-13 MED ORDER — BUDESONIDE 3 MG PO CPEP: ORAL_CAPSULE | ORAL | 4 refills | Status: DC

## 2017-08-13 NOTE — Progress Notes (Signed)
cc'ed to pcp °
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
## 2017-08-13 NOTE — Progress Notes (Signed)
ON RECALL  °

## 2017-08-13 NOTE — Patient Instructions (Addendum)
DRINK WATER TO KEEP YOUR URINE LIGHT YELLOW.  FOLLOW A HIGH FIBER DIET. AVOID ITEMS THAT CAUSE BLOATING & GAS.  CONTINUE 6 MG(2 PILLS) DAILY UNTIL Oct 11, 2017 THEN 3 MG(1 PILL) daily UNTIL Apr 10, 2018. THE GOAL IS LESS THAN 3 STOOL DAILY AND < 1 WATERY STOOL DAILY.   USE XANAX IF NEEDED FOR ANXIETY.  FOLLOW UP IN 6 MOS.

## 2017-08-13 NOTE — Telephone Encounter (Signed)
Called patient TO DISCUSS CONCERNS. NO ANSWER. LEFT VOICEMAIL. WILL TRY AGAIN TOMORROW.
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
## 2017-08-13 NOTE — Assessment & Plan Note (Signed)
BACK PAIN AND DYSPHAGIA.  REFER TO DR. Merlene Laughter FOR ADDITIONAL EVALUATION.

## 2017-08-13 NOTE — Assessment & Plan Note (Signed)
SYMPTOMS NOT IDEALLY CONTROLLED AND THEN RELAPSED AFTER STOPPING ENTOCORT THEN NOW CONTROLLED AFTER RE-STARTING.  6 MG DAILY FOR 4 WEEKS THEN 3 MG daily for 5 mos. THE GOAL IS LESS THAN 3 STOOL DAILY AND < 1 WATERY STOOL DAILY.  CONTINUE TO MONITOR SYMPTOMS. FOLLOW UP IN 4 MOS.
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
## 2017-08-14 NOTE — Telephone Encounter (Signed)
REVIEWED-NO ADDITIONAL RECOMMENDATIONS. 

## 2017-08-14 NOTE — Telephone Encounter (Signed)
Reviewed

## 2017-08-14 NOTE — Telephone Encounter (Signed)
Routing to [place] for follow-up

## 2017-08-14 NOTE — Telephone Encounter (Signed)
Routing to myself for follow up.

## 2017-09-30 ENCOUNTER — Emergency Department (HOSPITAL_COMMUNITY): Payer: Medicare Other

## 2017-09-30 ENCOUNTER — Encounter (HOSPITAL_COMMUNITY): Payer: Self-pay | Admitting: Emergency Medicine

## 2017-09-30 ENCOUNTER — Emergency Department (HOSPITAL_COMMUNITY)
Admission: EM | Admit: 2017-09-30 | Discharge: 2017-10-01 | Disposition: A | Discharge status: Home / Self Care | Payer: Medicare Other | Attending: Emergency Medicine | Admitting: Emergency Medicine

## 2017-09-30 DIAGNOSIS — T50901A Poisoning by unspecified drugs, medicaments and biological substances, accidental (unintentional), initial encounter: Secondary | ICD-10-CM

## 2017-09-30 DIAGNOSIS — Z79899 Other long term (current) drug therapy: Secondary | ICD-10-CM | POA: Diagnosis not present

## 2017-09-30 DIAGNOSIS — T424X1A Poisoning by benzodiazepines, accidental (unintentional), initial encounter: Secondary | ICD-10-CM | POA: Diagnosis not present

## 2017-09-30 DIAGNOSIS — M545 Low back pain: Secondary | ICD-10-CM | POA: Diagnosis not present

## 2017-09-30 DIAGNOSIS — J449 Chronic obstructive pulmonary disease, unspecified: Secondary | ICD-10-CM | POA: Insufficient documentation

## 2017-09-30 DIAGNOSIS — F1310 Sedative, hypnotic or anxiolytic abuse, uncomplicated: Secondary | ICD-10-CM

## 2017-09-30 DIAGNOSIS — I10 Essential (primary) hypertension: Secondary | ICD-10-CM | POA: Diagnosis not present

## 2017-09-30 DIAGNOSIS — Z96642 Presence of left artificial hip joint: Secondary | ICD-10-CM | POA: Insufficient documentation

## 2017-09-30 DIAGNOSIS — G8929 Other chronic pain: Secondary | ICD-10-CM | POA: Insufficient documentation

## 2017-09-30 DIAGNOSIS — F17210 Nicotine dependence, cigarettes, uncomplicated: Secondary | ICD-10-CM | POA: Diagnosis not present

## 2017-09-30 LAB — COMPREHENSIVE METABOLIC PANEL
ALT: 20 U/L (ref 17–63)
AST: 21 U/L (ref 15–41)
Albumin: 4 g/dL (ref 3.5–5.0)
Alkaline Phosphatase: 75 U/L (ref 38–126)
Anion gap: 9 (ref 5–15)
BUN: 17 mg/dL (ref 6–20)
CHLORIDE: 105 mmol/L (ref 101–111)
CO2: 23 mmol/L (ref 22–32)
Calcium: 9 mg/dL (ref 8.9–10.3)
Creatinine, Ser: 0.84 mg/dL (ref 0.61–1.24)
Glucose, Bld: 93 mg/dL (ref 65–99)
POTASSIUM: 4.2 mmol/L (ref 3.5–5.1)
SODIUM: 137 mmol/L (ref 135–145)
Total Bilirubin: 0.8 mg/dL (ref 0.3–1.2)
Total Protein: 6.4 g/dL — ABNORMAL LOW (ref 6.5–8.1)

## 2017-09-30 LAB — RAPID URINE DRUG SCREEN, HOSP PERFORMED
AMPHETAMINES: NOT DETECTED
BARBITURATES: NOT DETECTED
Benzodiazepines: POSITIVE — AB
COCAINE: NOT DETECTED
OPIATES: POSITIVE — AB
TETRAHYDROCANNABINOL: NOT DETECTED

## 2017-09-30 LAB — CBC WITH DIFFERENTIAL/PLATELET
BASOS ABS: 0.1 10*3/uL (ref 0.0–0.1)
Basophils Relative: 1 %
EOS ABS: 0.2 10*3/uL (ref 0.0–0.7)
EOS PCT: 3 %
HCT: 44.5 % (ref 39.0–52.0)
Hemoglobin: 15.1 g/dL (ref 13.0–17.0)
Lymphocytes Relative: 20 %
Lymphs Abs: 1.6 10*3/uL (ref 0.7–4.0)
MCH: 31.9 pg (ref 26.0–34.0)
MCHC: 33.9 g/dL (ref 30.0–36.0)
MCV: 93.9 fL (ref 78.0–100.0)
MONO ABS: 0.5 10*3/uL (ref 0.1–1.0)
Monocytes Relative: 6 %
NEUTROS ABS: 5.9 10*3/uL (ref 1.7–7.7)
NEUTROS PCT: 72 %
PLATELETS: 272 10*3/uL (ref 150–400)
RBC: 4.74 MIL/uL (ref 4.22–5.81)
RDW: 15.4 % (ref 11.5–15.5)
WBC: 8.2 10*3/uL (ref 4.0–10.5)

## 2017-09-30 LAB — ACETAMINOPHEN LEVEL: Acetaminophen (Tylenol), Serum: <10 ug/mL — ABNORMAL LOW (ref 10–30)

## 2017-09-30 LAB — URINALYSIS, ROUTINE W REFLEX MICROSCOPIC
BILIRUBIN URINE: NEGATIVE
Glucose, UA: NEGATIVE mg/dL
HGB URINE DIPSTICK: NEGATIVE
Ketones, ur: NEGATIVE mg/dL
Leukocytes, UA: NEGATIVE
Nitrite: NEGATIVE
Protein, ur: NEGATIVE mg/dL
SPECIFIC GRAVITY, URINE: 1.021 (ref 1.005–1.030)
pH: 5 (ref 5.0–8.0)

## 2017-09-30 LAB — CBG MONITORING, ED: GLUCOSE-CAPILLARY: 135 mg/dL — AB (ref 65–99)

## 2017-09-30 LAB — SALICYLATE LEVEL: Salicylate Lvl: 18.8 mg/dL (ref 2.8–30.0)

## 2017-09-30 LAB — ETHANOL: Alcohol, Ethyl (B): <10 mg/dL (ref <10)

## 2017-09-30 NOTE — ED Notes (Signed)
Pt came to the triage room and asked why he was here. He states he doesn't remember talking to this nurse or remember how he got here. Pt was alert. Oriented to person and time. EDP Lacinda Axon made aware

## 2017-09-30 NOTE — ED Notes (Signed)
Per pt's brother, pt is "messed up on pills", when asked what kind of pills, brother responds "xanax". Pt confused after waking up and does not know why he is here. Pt denies SI or HI. Pt denies ETOH use. Pt c/o chronic back pain.  Pt keeps referring to his wife who has cancer, pt unable to state where wife is.  Per brother, wife is staying with her sister.

## 2017-09-30 NOTE — ED Notes (Signed)
Pt remain on monitor, VS WNL. Pt resting w/ eyes closed. No acute distress noted.

## 2017-09-30 NOTE — ED Notes (Signed)
Went to collect urine sample, pt stated he could not urinate at this time. He also stated that he has drunk four glasses of water within the past hour.

## 2017-09-30 NOTE — ED Notes (Signed)
Pt and pt's family member informed of need for urine sample. Pt has attempted to provide sample but stated he was just "dry" because he had not been drinking water regularly. Pt provided with glass of water and pt's family member informed to let nursing staff know when able to provide sample.

## 2017-09-30 NOTE — ED Notes (Addendum)
While being triage pt would drift off to sleep and triaged questions would need to be asked twice. Pt is alert and oriented.

## 2017-09-30 NOTE — ED Provider Notes (Signed)
[facility] EMERGENCY DEPARTMENT Provider Note   CSN: [id number] Arrival date & time: 09/30/17  1141     History   Chief Complaint Chief Complaint  Patient presents with  . Back Pain    HPI Garrett Klein is a 61 y.o. male.  HPI Patient is a very poor historian.  He is here with his brother claiming he is "messed up on pills". patient has been drowsy in triage.  He complains of ongoing low back pain which worsened he believes 2 days ago while trying to lift furniture.  He had no focal weakness or numbness.  Denies any urinary incontinence.  He does have some pain that radiates down the left leg.  States has been taking Goody powders at home. Past Medical History:  Diagnosis Date  . Chronic lower back pain   . Collagenous colitis [address] [place]  . COPD (chronic obstructive pulmonary disease) ([place])   . Essential hypertension   . Hyperlipidemia   . MRSA infection   . PUD (peptic ulcer disease)     Patient Active Problem List   Diagnosis Date Noted  . Abdominal pain in male 06/04/2017  . Heme positive stool   . Serrated adenoma of colon 04/10/2017  . Coronary artery calcification 08/09/2016  . Essential hypertension 08/09/2016  . Anal skin tag 08/23/2015  . Erosive esophagitis 05/05/2015  . PUD (peptic ulcer disease) 05/05/2015  . Collagenous colitis 05/05/2015  . Dysphagia 05/05/2015  . HCAP (healthcare-associated pneumonia) 04/23/2014  . Arthritis 04/23/2014  . COPD (chronic obstructive pulmonary disease) ([place]) 04/23/2014  . Tobacco abuse 04/23/2014  . Hypercholesterolemia 04/23/2014  . Pneumococcal pneumonia ([place]) 04/23/2014  . Bursitis of elbow     Past Surgical History:  Procedure Laterality Date  . BACK SURGERY    . COLONOSCOPY  2001   Dr. Sharlett Iles, normal  . COLONOSCOPY  04/2013   [facility]: Fentanyl 200mg Danford Bad 10mg : sigmoid diverticulosis, normal terminal ileum, small internal hemorrhoids, random colon bx: collagenous colitis. next tcs 04/2018  .  COLONOSCOPY  2011   [facility]: two polyps, sessile serrated adenoma, mild diverticulosis  . ELBOW BURSA SURGERY    . FINGER AMPUTATION Left 1985   index finger  . JOINT REPLACEMENT     left hip surgery - January 26, 2014  . ROTATOR CUFF REPAIR     November 2014       Home Medications    Prior to Admission medications   Medication Sig Start Date End Date Taking? Authorizing Provider  albuterol (PROVENTIL HFA;VENTOLIN HFA) 108 (90 BASE) MCG/ACT inhaler Inhale 2 puffs into the lungs every 6 (six) hours as needed for wheezing or shortness of breath.   Yes [provider]  albuterol (PROVENTIL) (2.5 MG/3ML) 0.083% nebulizer solution Take 2.5 mg by nebulization every 6 (six) hours as needed for wheezing or shortness of breath.   Yes [provider]  ALPRAZolam [name]) 1 MG tablet 1/2 TO 1 PO BID PRN ANXIETY 08/13/17  Yes Fields, Sandi L, MD  atorvastatin (LIPITOR) 80 MG tablet Take 80 mg by mouth every evening.    Yes [provider]  budesonide (ENTOCORT EC) 3 MG 24 hr capsule 2 PO DAILY 08/13/17  Yes Fields, Sandi L, MD  budesonide-formoterol (SYMBICORT) 160-4.5 MCG/ACT inhaler Inhale 2 puffs into the lungs 2 (two) times daily.   Yes [provider]  cholecalciferol (VITAMIN D) 1000 UNITS tablet Take 4,000 Units by mouth daily.    Yes [provider]  lisinopril-hydrochlorothiazide (  PRINZIDE,ZESTORETIC) 10-12.5 MG tablet Take 1 tablet by mouth daily.   Yes [provider]  nicotine (NICODERM CQ - DOSED IN MG/24 HOURS) 21 mg/24hr patch Place 21 mg onto the skin daily as needed (for smoking cessation.).    Yes [provider]  omeprazole (PRILOSEC) 20 MG capsule 1 PO 30 MINS BEFORE YOUR FIRST MEAL EVERY DAY FOREVER Patient taking differently: Take 20 mg by mouth daily before breakfast. 30 minutes before breakfast 03/07/17  Yes Fields, Sandi L, MD  predniSONE (STERAPRED UNI-PAK 21 TAB) 10 MG (21) TBPK tablet Take 10 mg by mouth as  directed. 08/11/17  Yes [provider]  sildenafil (VIAGRA) 100 MG tablet Take 100 mg by mouth daily as needed for erectile dysfunction.   Yes [provider]  umeclidinium bromide (INCRUSE ELLIPTA) 62.5 MCG/INH AEPB Inhale 1 puff into the lungs every morning.    Yes [provider]  EPINEPHrine 0.3 mg/0.3 mL IJ SOAJ injection Inject 0.3 mg into the muscle as needed (allergic reaction).    [provider]    Family History Family History  Problem Relation Age of Onset  . Cancer Mother   . Diabetes type II Father   . Heart attack Father   . Arthritis Sister   . Stroke Brother   . Coronary artery disease Brother   . Diabetes Other   . Heart attack Unknown   . Colon cancer Neg Hx   . Liver disease Neg Hx   . Ulcers Neg Hx     Social History Social History   Tobacco Use  . Smoking status: Current Every Day Smoker    Packs/day: 0.50    Years: 40.00    Pack years: 20.00    Types: Cigarettes    Start date: 01/09/1969  . Smokeless tobacco: Never Used  Substance Use Topics  . Alcohol use: No    Alcohol/week: 0.0 oz  . Drug use: No     Allergies   Bee venom; Ciprofloxacin; Hydrocodone; Levaquin [levofloxacin in d5w]; Penicillins; Tramadol; and Sulfa antibiotics   Review of Systems Review of Systems  Constitutional: Negative for chills and fever.  Respiratory: Negative for cough and shortness of breath.   Cardiovascular: Negative for chest pain, palpitations and leg swelling.  Gastrointestinal: Negative for abdominal pain, diarrhea, nausea and vomiting.  Genitourinary: Negative for difficulty urinating, dysuria, flank pain and frequency.  Musculoskeletal: Positive for back pain and myalgias. Negative for neck pain.  Skin: Negative for rash and wound.  Neurological: Negative for dizziness, syncope, weakness, light-headedness, numbness and headaches.  Psychiatric/Behavioral: The patient is nervous/anxious.   All other systems reviewed and  are negative.    Physical Exam Updated Vital Signs BP 109/75   Pulse 99   Temp (!) 97 F (36.1 C) (Temporal)   Resp 14   Ht 5\' 10"  (1.778 m)   Wt 79.8 kg (176 lb)   SpO2 94%   BMI 25.25 kg/m   Physical Exam  Constitutional: He appears well-developed and well-nourished.  Patient is intermittently tearful and then drowsy  HENT:  Head: Normocephalic and atraumatic.  Mouth/Throat: Oropharynx is clear and moist. No oropharyngeal exudate.  Eyes: EOM are normal. Pupils are equal, round, and reactive to light.  Neck: Normal range of motion. Neck supple.  No posterior midline cervical tenderness to palpation.  Cardiovascular: Normal rate and regular rhythm. Exam reveals no gallop and no friction rub.  No murmur heard. Pulmonary/Chest: Effort normal and breath sounds normal. No stridor. No respiratory distress.  He has no wheezes. He has no rales. He exhibits no tenderness.  Abdominal: Soft. Bowel sounds are normal. There is no tenderness. There is no rebound and no guarding.  Musculoskeletal: Normal range of motion. He exhibits tenderness. He exhibits no edema.  Patient has left lumbar paraspinal tenderness to palpation.  No definite central lumbar tenderness, step-offs or deformity.  Negative straight leg raise bilaterally.  Distal pulses are 2+.  Neurological: He is alert.  5/5 motor in all extremities.  Sensation intact.  Skin: Skin is warm and dry. No rash noted. No erythema.  Psychiatric:  Emotionally labile.  Intermittently drowsy.  Tangential thought pattern.  Nursing note and vitals reviewed.    ED Treatments / Results  Labs (all labs ordered are listed, but only abnormal results are displayed) Labs Reviewed  COMPREHENSIVE METABOLIC PANEL - Abnormal; Notable for the following components:      Result Value   Total Protein 6.4 (*)    All other components within normal limits  RAPID URINE DRUG SCREEN, HOSP PERFORMED - Abnormal; Notable for the following components:    Opiates POSITIVE (*)    Benzodiazepines POSITIVE (*)    All other components within normal limits  ACETAMINOPHEN LEVEL - Abnormal; Notable for the following components:   Acetaminophen (Tylenol), Serum <10 (*)    All other components within normal limits  CBG MONITORING, ED - Abnormal; Notable for the following components:   Glucose-Capillary 135 (*)    All other components within normal limits  CBC WITH DIFFERENTIAL/PLATELET  ETHANOL  URINALYSIS, ROUTINE W REFLEX MICROSCOPIC  SALICYLATE LEVEL  SALICYLATE LEVEL    EKG  EKG Interpretation  Date/Time:  Tuesday September 30 2017 23:43:11 EST Ventricular Rate:  86 PR Interval:    QRS Duration: 138 QT Interval:  364 QTC Calculation: 436 R Axis:   98 Text Interpretation:  Sinus rhythm RBBB and LPFB Confirmed by Julianne Rice [phone]) on 09/30/2017 11:45:31 PM       Radiology Dg Lumbar Spine 2-3 Views  Result Date: 09/30/2017 CLINICAL DATA:  Chronic low back pain EXAM: LUMBAR SPINE - 2-3 VIEW COMPARISON:  None. FINDINGS: There is no evidence of lumbar spine fracture. Alignment is normal. Degenerative joint changes of lumbar spine with narrowed joint space, osteophyte formation and facet joint sclerosis are identified throughout lumbar spine. IMPRESSION: No acute fracture or dislocation. Osteoarthritic changes of lumbar spine. Electronically Signed   By: Abelardo Diesel M.D.   On: 09/30/2017 16:11    Procedures Procedures (including critical care time)  Medications Ordered in ED Medications  naloxone (NARCAN) injection 0.4 mg (0.4 mg Intravenous Given 10/01/17 0031)     Initial Impression / Assessment and Plan / ED Course  I have reviewed the triage vital signs and the nursing notes.  Pertinent labs & imaging results that were available during my care of the patient were reviewed by me and considered in my medical decision making (see chart for details).    Patient signed out to oncoming emergency physician pending  sobriety.  Has voiced no suicidal ideation.  Suspect accidental overdose on benzodiazepine and opiates.   Final Clinical Impressions(s) / ED Diagnoses   Final diagnoses:  Benzodiazepine abuse ([place])  Chronic low back pain, unspecified back pain laterality, with sciatica presence unspecified  Accidental drug overdose, initial encounter    ED Discharge Orders    None       Julianne Rice, MD 10/01/17 1910

## 2017-09-30 NOTE — ED Notes (Signed)
Delay in urine results- urine after collected was given to lab member Claiborne Billings, who had left urine on her cart and had gone home.  Lab was able to find sample and will run.

## 2017-09-30 NOTE — ED Notes (Signed)
Patrick Jupiter Fruth-pt's brother [phone]

## 2017-09-30 NOTE — ED Triage Notes (Signed)
Pt reports he has chronic back pain that flared up. Denies home medication use, pt appears to be lethargic and pupils pin point. Answers questions appropriately and alert and oriented X4.

## 2017-09-30 NOTE — ED Notes (Signed)
Pt able to state month, year and age, and place.

## 2017-09-30 NOTE — ED Notes (Signed)
While putting pt on cardiac monitor, he stated that he was unsure what he's here for and why his wife isn't with him. Also says left arm feels numb from sleeping on it in waiting room.

## 2017-09-30 NOTE — ED Notes (Signed)
Pt asleep, not easily aroused.  RA sats decreased to 88%, O2 via [place] at 2.5 L/m applied and sats increased to 95%.  EDP made aware.  Pt's brother in room, obtained number to call once pt wakes up and is ready for discharge.

## 2017-10-01 DIAGNOSIS — M545 Low back pain: Secondary | ICD-10-CM | POA: Diagnosis not present

## 2017-10-01 LAB — SALICYLATE LEVEL: SALICYLATE LVL: 13.2 mg/dL (ref 2.8–30.0)

## 2017-10-01 MED ORDER — NALOXONE HCL 0.4 MG/ML IJ SOLN
0.4000 mg | Freq: Once | INTRAMUSCULAR | Status: AC
  Administered 2017-10-01: 0.4 mg via INTRAVENOUS
  Filled 2017-10-01: qty 1

## 2017-10-01 NOTE — ED Notes (Signed)
Pt watching TV

## 2017-10-01 NOTE — ED Provider Notes (Signed)
I assumed care in signout to allow patient to wake up Will need repeat ASA level  EKG Interpretation  Date/Time:  Tuesday September 30 2017 23:43:11 EST Ventricular Rate:  86 PR Interval:    QRS Duration: 138 QT Interval:  364 QTC Calculation: 436 R Axis:   98 Text Interpretation:  Sinus rhythm RBBB and LPFB Confirmed by Julianne Rice [phone]) on 09/30/2017 11:45:31 PM      On my assessment, pt somnolent, hypoxic, but he is arousable Narcan given and pt is more awake/alert, and wanting to go to bathroom    Ripley Fraise, MD 10/01/17 0045

## 2017-10-01 NOTE — ED Notes (Signed)
Brother called to pick pt up.

## 2017-10-01 NOTE — ED Notes (Signed)
Pt alert & oriented x4, stable gait. Patient given discharge instructions, paperwork & prescription(s). Patient instructed to stop at the registration desk to finish any additional paperwork. Patient verbalized understanding. Pt left department in wheelchair escorted by staff. Pt left w/ no further questions. 

## 2017-10-01 NOTE — ED Notes (Signed)
Pt awaken to verbal stimuli. Pt will now answer a few questions & fall back to sleep.

## 2017-10-01 NOTE — ED Provider Notes (Signed)
Pt significantly improved and is has been >1 hr since given 0.4narcan He is awake/alert He is able to take PO He is watching TV He is ambulatory Labs reassuring, ASA level is improved I discussed with patient that he should use extreme caution when using multiple sedating medications as it could be lethal He understands this He adamantly denies SI at this time  Will discharge.  Brother has been called    Ripley Fraise, MD 10/01/17 [phone]

## 2017-10-01 NOTE — ED Notes (Signed)
Pt alert &  Needing to use restroom. Pt unhooked from monitor & ambulated to restroom. Returned to stretcher & went back to sleep.

## 2017-10-21 ENCOUNTER — Encounter: Payer: Self-pay | Admitting: Gastroenterology

## 2017-11-06 ENCOUNTER — Telehealth: Payer: Self-pay | Admitting: Gastroenterology

## 2017-11-06 NOTE — Telephone Encounter (Signed)
Called patient TO [facility] [employer]. MAILBOX FULL.

## 2018-02-03 ENCOUNTER — Other Ambulatory Visit: Payer: Self-pay | Admitting: *Deleted

## 2018-02-03 MED ORDER — BUDESONIDE 3 MG PO CPEP: ORAL_CAPSULE | ORAL | 5 refills | Status: DC

## 2018-02-06 ENCOUNTER — Encounter: Payer: Self-pay | Admitting: Gastroenterology

## 2018-02-10 ENCOUNTER — Ambulatory Visit (HOSPITAL_COMMUNITY): Payer: Medicare Other | Admitting: Licensed Clinical Social Worker

## 2018-03-02 ENCOUNTER — Encounter (HOSPITAL_COMMUNITY): Payer: Self-pay | Admitting: Emergency Medicine

## 2018-03-02 ENCOUNTER — Emergency Department (HOSPITAL_COMMUNITY)
Admission: EM | Admit: 2018-03-02 | Discharge: 2018-03-02 | Disposition: A | Discharge status: Home / Self Care | Payer: Medicare Other | Attending: Emergency Medicine | Admitting: Emergency Medicine

## 2018-03-02 ENCOUNTER — Other Ambulatory Visit: Payer: Self-pay

## 2018-03-02 DIAGNOSIS — F17210 Nicotine dependence, cigarettes, uncomplicated: Secondary | ICD-10-CM | POA: Insufficient documentation

## 2018-03-02 DIAGNOSIS — I10 Essential (primary) hypertension: Secondary | ICD-10-CM | POA: Insufficient documentation

## 2018-03-02 DIAGNOSIS — J449 Chronic obstructive pulmonary disease, unspecified: Secondary | ICD-10-CM | POA: Insufficient documentation

## 2018-03-02 DIAGNOSIS — Z96642 Presence of left artificial hip joint: Secondary | ICD-10-CM | POA: Insufficient documentation

## 2018-03-02 DIAGNOSIS — K5792 Diverticulitis of intestine, part unspecified, without perforation or abscess without bleeding: Secondary | ICD-10-CM | POA: Insufficient documentation

## 2018-03-02 DIAGNOSIS — Z79899 Other long term (current) drug therapy: Secondary | ICD-10-CM | POA: Insufficient documentation

## 2018-03-02 DIAGNOSIS — R1032 Left lower quadrant pain: Secondary | ICD-10-CM | POA: Diagnosis present

## 2018-03-02 LAB — CBC
HCT: 48.7 % (ref 39.0–52.0)
Hemoglobin: 16.1 g/dL (ref 13.0–17.0)
MCH: 32.5 pg (ref 26.0–34.0)
MCHC: 33.1 g/dL (ref 30.0–36.0)
MCV: 98.2 fL (ref 78.0–100.0)
Platelets: 335 10*3/uL (ref 150–400)
RBC: 4.96 MIL/uL (ref 4.22–5.81)
RDW: 14.3 % (ref 11.5–15.5)
WBC: 20 10*3/uL — ABNORMAL HIGH (ref 4.0–10.5)

## 2018-03-02 LAB — URINALYSIS, ROUTINE W REFLEX MICROSCOPIC
Bilirubin Urine: NEGATIVE
Glucose, UA: NEGATIVE mg/dL
Hgb urine dipstick: NEGATIVE
Ketones, ur: NEGATIVE mg/dL
Leukocytes, UA: NEGATIVE
Nitrite: NEGATIVE
Protein, ur: NEGATIVE mg/dL
Specific Gravity, Urine: 1.012 (ref 1.005–1.030)
pH: 5 (ref 5.0–8.0)

## 2018-03-02 LAB — COMPREHENSIVE METABOLIC PANEL
ALT: 16 U/L — ABNORMAL LOW (ref 17–63)
AST: 16 U/L (ref 15–41)
Albumin: 4 g/dL (ref 3.5–5.0)
Alkaline Phosphatase: 98 U/L (ref 38–126)
Anion gap: 13 (ref 5–15)
BUN: 29 mg/dL — ABNORMAL HIGH (ref 6–20)
CO2: 23 mmol/L (ref 22–32)
Calcium: 9.7 mg/dL (ref 8.9–10.3)
Chloride: 97 mmol/L — ABNORMAL LOW (ref 101–111)
Creatinine, Ser: 1.57 mg/dL — ABNORMAL HIGH (ref 0.61–1.24)
GFR calc Af Amer: 52 mL/min — ABNORMAL LOW (ref >60)
GFR calc non Af Amer: 45 mL/min — ABNORMAL LOW (ref >60)
Glucose, Bld: 83 mg/dL (ref 65–99)
Potassium: 4.5 mmol/L (ref 3.5–5.1)
Sodium: 133 mmol/L — ABNORMAL LOW (ref 135–145)
Total Bilirubin: 0.6 mg/dL (ref 0.3–1.2)
Total Protein: 7 g/dL (ref 6.5–8.1)

## 2018-03-02 LAB — LIPASE, BLOOD: Lipase: 32 U/L (ref 11–51)

## 2018-03-02 MED ORDER — METRONIDAZOLE 500 MG PO TABS: 500.0000 mg | ORAL_TABLET | Freq: Four times a day (QID) | ORAL | 0 refills | Status: DC

## 2018-03-02 MED ORDER — HYDROCODONE-ACETAMINOPHEN 5-325 MG PO TABS: 1.0000 | ORAL_TABLET | ORAL | 0 refills | Status: DC | PRN

## 2018-03-02 MED ORDER — CEPHALEXIN 500 MG PO CAPS
500.0000 mg | ORAL_CAPSULE | Freq: Once | ORAL | Status: AC
  Administered 2018-03-02: 500 mg via ORAL
  Filled 2018-03-02: qty 1

## 2018-03-02 MED ORDER — SODIUM CHLORIDE 0.9 % IV BOLUS
1000.0000 mL | Freq: Once | INTRAVENOUS | Status: AC
Start: 2018-03-02 — End: 2018-03-02
  Administered 2018-03-02: 1000 mL via INTRAVENOUS

## 2018-03-02 MED ORDER — CEPHALEXIN 500 MG PO CAPS: 500.0000 mg | ORAL_CAPSULE | Freq: Three times a day (TID) | ORAL | 0 refills | Status: DC

## 2018-03-02 MED ORDER — HYDROMORPHONE HCL 1 MG/ML IJ SOLN
0.7500 mg | Freq: Once | INTRAMUSCULAR | Status: AC
  Administered 2018-03-02: 0.75 mg via INTRAVENOUS
  Filled 2018-03-02: qty 1

## 2018-03-02 MED ORDER — METRONIDAZOLE 500 MG PO TABS
500.0000 mg | ORAL_TABLET | Freq: Once | ORAL | Status: AC
  Administered 2018-03-02: 500 mg via ORAL
  Filled 2018-03-02: qty 1

## 2018-03-02 NOTE — ED Triage Notes (Signed)
Pt states started having diarrhea yesterday. Pt has history of colitis, and IBS. Pt states cramping and diarrhea since yesterday.

## 2018-03-05 ENCOUNTER — Ambulatory Visit (HOSPITAL_COMMUNITY): Payer: Medicare Other | Admitting: Licensed Clinical Social Worker

## 2018-03-06 NOTE — ED Provider Notes (Signed)
[facility] EMERGENCY DEPARTMENT Provider Note   CSN: [id number] Arrival date & time: 03/02/18  1505     History   Chief Complaint Chief Complaint  Patient presents with  . Diarrhea    HPI Garrett Klein is a 62 y.o. male.  HPI   62 year old male with abdominal pain and diarrhea.  Symptom onset about 2 days ago.  Describes a small amount of blood in stool initially which has since resolved.  The patient is abdomen is on the left side achy to crampy in nature.  No appreciable exacerbating factors.  No nausea or vomiting.  No urinary complaints.  No fevers.  Past Medical History:  Diagnosis Date  . Chronic lower back pain   . Collagenous colitis [address] [place]  . COPD (chronic obstructive pulmonary disease) ([place])   . Essential hypertension   . Hyperlipidemia   . MRSA infection   . PUD (peptic ulcer disease)     Patient Active Problem List   Diagnosis Date Noted  . Abdominal pain in male 06/04/2017  . Heme positive stool   . Serrated adenoma of colon 04/10/2017  . Coronary artery calcification 08/09/2016  . Essential hypertension 08/09/2016  . Anal skin tag 08/23/2015  . Erosive esophagitis 05/05/2015  . PUD (peptic ulcer disease) 05/05/2015  . Collagenous colitis 05/05/2015  . Dysphagia 05/05/2015  . HCAP (healthcare-associated pneumonia) 04/23/2014  . Arthritis 04/23/2014  . COPD (chronic obstructive pulmonary disease) ([place]) 04/23/2014  . Tobacco abuse 04/23/2014  . Hypercholesterolemia 04/23/2014  . Pneumococcal pneumonia ([place]) 04/23/2014  . Bursitis of elbow     Past Surgical History:  Procedure Laterality Date  . BACK SURGERY    . BIOPSY  06/03/2017   Procedure: BIOPSY;  Surgeon: Danie Binder, MD;  Location: [facility];  Service: Endoscopy;;  colon  . COLONOSCOPY  2001   Dr. Sharlett Iles, normal  . COLONOSCOPY  04/2013   [facility]: Fentanyl 200mg Danford Bad 10mg : sigmoid diverticulosis, normal terminal ileum, small internal hemorrhoids, random colon bx:  collagenous colitis. next tcs 04/2018  . COLONOSCOPY  2011   [facility]: two polyps, sessile serrated adenoma, mild diverticulosis  . COLONOSCOPY WITH PROPOFOL N/A 06/03/2017   Procedure: COLONOSCOPY WITH PROPOFOL;  Surgeon: Danie Binder, MD;  Location: [facility];  Service: Endoscopy;  Laterality: N/A;  10:00am  . ELBOW BURSA SURGERY    . ESOPHAGEAL DILATION N/A 05/19/2015   Procedure: ESOPHAGEAL DILATION;  Surgeon: Danie Binder, MD;  Location: [facility];  Service: Endoscopy;  Laterality: N/A;  . ESOPHAGOGASTRODUODENOSCOPY N/A 10/28/2014   [facility]: 1. Esophagitis due to GERD. KOH neg. 2. Small hiatal hernia 3. Moderate erosive gastritis 4. RUQ pain due to large ulcer 5. [place] duodentis in the bulb.   . ESOPHAGOGASTRODUODENOSCOPY N/A 05/19/2015   [facility]: 1. Mild distal esophagitis 2. Peptic stricture at the gastroesophageal junction 3. Mild non-erosive gastritis 4. Pseudo pylorus due to prior PUD.   Marland Kitchen ESOPHAGOGASTRODUODENOSCOPY N/A 03/07/2017   Procedure: ESOPHAGOGASTRODUODENOSCOPY (EGD);  Surgeon: Danie Binder, MD;  [facility];  Service: Endoscopy;  Laterality: N/A;  200  . FINGER AMPUTATION Left 1985   index finger  . JOINT REPLACEMENT     left hip surgery - January 26, 2014  . POLYPECTOMY  06/03/2017   Procedure: POLYPECTOMY;  Surgeon: Danie Binder, MD;  Location[facility];  Service: Endoscopy;;  colon  . ROTATOR CUFF REPAIR     November 2014  . SAVORY DILATION N/A  03/07/2017   Procedure: SAVORY DILATION;  Surgeon: Danie Binder, MD;  Location[facility];  Service: Endoscopy;  Laterality: N/A;        Home Medications    Prior to Admission medications   Medication Sig Start Date End Date Taking? Authorizing Provider  albuterol (PROVENTIL HFA;VENTOLIN HFA) 108 (90 BASE) MCG/ACT inhaler Inhale 2 puffs into the lungs every 6 (six) hours as needed for wheezing or shortness of breath.   Yes [provider]  albuterol (PROVENTIL) (2.5 MG/3ML) 0.083%  nebulizer solution Take 2.5 mg by nebulization every 6 (six) hours as needed for wheezing or shortness of breath.   Yes [provider]  ALPRAZolam [name]) 0.5 MG tablet Take 1 tablet by mouth 2 (two) times daily as needed for anxiety or sleep.  02/23/18  Yes [provider]  atorvastatin (LIPITOR) 80 MG tablet Take 40 mg by mouth every evening.    Yes [provider]  budesonide (ENTOCORT EC) 3 MG 24 hr capsule 2 PO DAILY Patient taking differently: Take 6 mg by mouth daily.  02/03/18  Yes Carlis Stable, NP  budesonide-formoterol (SYMBICORT) 160-4.5 MCG/ACT inhaler Inhale 2 puffs into the lungs 2 (two) times daily.   Yes [provider]  cholecalciferol (VITAMIN D) 1000 UNITS tablet Take 4,000 Units by mouth daily.    Yes [provider]  EPINEPHrine 0.3 mg/0.3 mL IJ SOAJ injection Inject 0.3 mg into the muscle as needed (allergic reaction).   Yes [provider]  escitalopram (LEXAPRO) 10 MG tablet Take 10 mg by mouth daily. 02/17/18  Yes [provider]  lisinopril-hydrochlorothiazide (PRINZIDE,ZESTORETIC) 10-12.5 MG tablet Take 1 tablet by mouth daily.   Yes [provider]  nicotine (NICODERM CQ - DOSED IN MG/24 HOURS) 21 mg/24hr patch Place 21 mg onto the skin daily as needed (for smoking cessation.).    Yes [provider]  omeprazole (PRILOSEC) 20 MG capsule 1 PO 30 MINS BEFORE YOUR FIRST MEAL EVERY DAY FOREVER Patient taking differently: Take 20 mg by mouth daily as needed. 30 minutes before breakfast 03/07/17  Yes Fields, Sandi L, MD  sildenafil (VIAGRA) 100 MG tablet Take 100 mg by mouth daily as needed for erectile dysfunction.   Yes [provider]  umeclidinium bromide (INCRUSE ELLIPTA) 62.5 MCG/INH AEPB Inhale 1 puff into the lungs every morning.    Yes [provider]  cephALEXin (KEFLEX) 500 MG capsule Take 1 capsule (500 mg total) by mouth 3 (three) times daily. 03/02/18   Virgel Manifold, MD    HYDROcodone-acetaminophen (NORCO/VICODIN) 5-325 MG tablet Take 1 tablet by mouth every 4 (four) hours as needed. 03/02/18   Virgel Manifold, MD  metroNIDAZOLE (FLAGYL) 500 MG tablet Take 1 tablet (500 mg total) by mouth 4 (four) times daily. 03/02/18   Virgel Manifold, MD    Family History Family History  Problem Relation Age of Onset  . Cancer Mother   . Diabetes type II Father   . Heart attack Father   . Arthritis Sister   . Stroke Brother   . Coronary artery disease Brother   . Diabetes Other   . Heart attack Unknown   . Colon cancer Neg Hx   . Liver disease Neg Hx   . Ulcers Neg Hx     Social History Social History   Tobacco Use  . Smoking status: Current Every Day Smoker    Packs/day: 0.50    Years: 40.00    Pack years: 20.00  Types: Cigarettes    Start date: 01/09/1969  . Smokeless tobacco: Never Used  Substance Use Topics  . Alcohol use: No    Alcohol/week: 0.0 oz  . Drug use: No     Allergies   Bee venom; Ciprofloxacin; Hydrocodone; Levaquin [levofloxacin in d5w]; Penicillins; Tramadol; and Sulfa antibiotics   Review of Systems Review of Systems  All systems reviewed and negative, other than as noted in HPI.  Physical Exam Updated Vital Signs BP 120/75   Pulse 75   Temp 98 F (36.7 C) (Oral)   Resp 18   Ht 5\' 10"  (1.778 m)   Wt 68 kg (150 lb)   SpO2 100%   BMI 21.52 kg/m   Physical Exam  Constitutional: He appears well-developed and well-nourished. No distress.  HENT:  Head: Normocephalic and atraumatic.  Eyes: Conjunctivae are normal. Right eye exhibits no discharge. Left eye exhibits no discharge.  Neck: Neck supple.  Cardiovascular: Normal rate, regular rhythm and normal heart sounds. Exam reveals no gallop and no friction rub.  No murmur heard. Pulmonary/Chest: Effort normal and breath sounds normal. No respiratory distress.  Abdominal: Soft. He exhibits no distension. There is tenderness.  Mild tenderness to palpation of the mid to  left lower quadrant without rebound or guarding.  Musculoskeletal: He exhibits no edema or tenderness.  Neurological: He is alert.  Skin: Skin is warm and dry.  Psychiatric: He has a normal mood and affect. His behavior is normal. Thought content normal.  Nursing note and vitals reviewed.    ED Treatments / Results  Labs (all labs ordered are listed, but only abnormal results are displayed) Labs Reviewed  COMPREHENSIVE METABOLIC PANEL - Abnormal; Notable for the following components:      Result Value   Sodium 133 (*)    Chloride 97 (*)    BUN 29 (*)    Creatinine, Ser 1.57 (*)    ALT 16 (*)    GFR calc non Af Amer 45 (*)    GFR calc Af Amer 52 (*)    All other components within normal limits  CBC - Abnormal; Notable for the following components:   WBC 20.0 (*)    All other components within normal limits  LIPASE, BLOOD  URINALYSIS, ROUTINE W REFLEX MICROSCOPIC    EKG None  Radiology No results found.   Procedures Procedures (including critical care time)  Medications Ordered in ED Medications  sodium chloride 0.9 % bolus 1,000 mL (0 mLs Intravenous Stopped 03/02/18 2022)  HYDROmorphone (DILAUDID) injection 0.75 mg (0.75 mg Intravenous Given 03/02/18 1824)  metroNIDAZOLE (FLAGYL) tablet 500 mg (500 mg Oral Given 03/02/18 1825)  cephALEXin (KEFLEX) capsule 500 mg (500 mg Oral Given 03/02/18 1825)     Initial Impression / Assessment and Plan / ED Course  I have reviewed the triage vital signs and the nursing notes.  Pertinent labs & imaging results that were available during my care of the patient were reviewed by me and considered in my medical decision making (see chart for details).     62 year old male with lower abdominal pain and diarrhea.  Small amount of blood in stool recently.  Afebrile.  Some mild lower to mid left abdominal tenderness.  Suspect diverticulitis or colitis.  Doubt acute surgical process.  Will put on antibiotics.  As needed symptom control  otherwise. Final Clinical Impressions(s) / ED Diagnoses   Final diagnoses:  Diverticulitis    ED Discharge Orders        Ordered  cephALEXin (KEFLEX) 500 MG capsule  3 times daily     03/02/18 2019    metroNIDAZOLE (FLAGYL) 500 MG tablet  4 times daily     03/02/18 2019    HYDROcodone-acetaminophen (NORCO/VICODIN) 5-325 MG tablet  Every 4 hours PRN     03/02/18 2019       Virgel Manifold, MD 03/07/18 [phone]

## 2018-03-16 ENCOUNTER — Ambulatory Visit (INDEPENDENT_AMBULATORY_CARE_PROVIDER_SITE_OTHER): Payer: Medicare Other | Admitting: Licensed Clinical Social Worker

## 2018-03-16 DIAGNOSIS — F4323 Adjustment disorder with mixed anxiety and depressed mood: Secondary | ICD-10-CM

## 2018-03-16 DIAGNOSIS — F4321 Adjustment disorder with depressed mood: Secondary | ICD-10-CM

## 2018-03-17 ENCOUNTER — Encounter (HOSPITAL_COMMUNITY): Payer: Self-pay | Admitting: Licensed Clinical Social Worker

## 2018-03-17 DIAGNOSIS — F4321 Adjustment disorder with depressed mood: Secondary | ICD-10-CM | POA: Insufficient documentation

## 2018-03-17 NOTE — Progress Notes (Signed)
Comprehensive Clinical Assessment (CCA) Note  03/17/2018 Garrett Klein [id number]  Visit Diagnosis:      ICD-10-CM   1. Adjustment disorder with mixed anxiety and depressed mood F43.23       CCA Part One  Part One has been completed on paper by the patient.  (See scanned document in Chart Review)  CCA Part Two A  Intake/Chief Complaint:  CCA Intake With Chief Complaint CCA Part Two Date: 03/16/18 CCA Part Two Time: 56 Chief Complaint/Presenting Problem: Grief (Patient is a 62 year old caucasian male that presents oriented x5 (person, place, situation, time and object) alert, tearful, average height, average weigh, appropriately groomed, casually dressed, and cooperative) Patients Currently Reported Symptoms/Problems: Mood: tearfulness, feels overwhelmed, forgetful, feels like he is falling apart, no energy, fatigue, worries, no appetite, some difficulty staying asleep, weight loss,  about losing his brother  Collateral Involvement: None Individual's Strengths: Love God, loves his wife, family is important, hard worker Individual's Preferences: Prefers being outside, prefers hunting and fishing, Doesn't prefer greens (salad)  Individual's Abilities: Clinical biochemist, heavy equipment Type of Services Patient Feels Are Needed: Therapy, medication management Initial Clinical Notes/Concerns: Symptoms started in 08-Mar-2017 when his wife passed away, symptoms occur daily, symptoms are severe   Mental Health Symptoms Depression:  Depression: Change in energy/activity, Difficulty Concentrating, Fatigue, Increase/decrease in appetite, Irritability, Sleep (too much or little), Tearfulness, Weight gain/loss  Mania:  Mania: N/A  Anxiety:   Anxiety: Worrying  Psychosis:  Psychosis: N/A  Trauma:  Trauma: N/A  Obsessions:  Obsessions: N/A  Compulsions:  Compulsions: N/A  Inattention:  Inattention: N/A  Hyperactivity/Impulsivity:     Oppositional/Defiant Behaviors:  Oppositional/Defiant Behaviors: N/A   Borderline Personality:  Emotional Irregularity: N/A  Other Mood/Personality Symptoms:  Other Mood/Personality Symtpoms: None reported    Mental Status Exam Appearance and self-care  Stature:  Stature: Average  Weight:  Weight: Thin  Clothing:  Clothing: Casual  Grooming:  Grooming: Normal  Cosmetic use:  Cosmetic Use: None  Posture/gait:  Posture/Gait: Normal  Motor activity:  Motor Activity: Not Remarkable  Sensorium  Attention:  Attention: Normal  Concentration:  Concentration: Normal  Orientation:  Orientation: X5  Recall/memory:  Recall/Memory: Normal  Affect and Mood  Affect:  Affect: Depressed  Mood:  Mood: Depressed  Relating  Eye contact:  Eye Contact: Normal  Facial expression:  Facial Expression: Responsive  Attitude toward examiner:  Attitude Toward Examiner: Cooperative  Thought and Language  Speech flow: Speech Flow: Normal  Thought content:  Thought Content: Appropriate to mood and circumstances  Preoccupation:  Preoccupations: (None)  Hallucinations:  Hallucinations: (None)  Organization:   Logical   Transport planner of Knowledge:  Fund of Knowledge: Average  Intelligence:  Intelligence: Average  Abstraction:  Abstraction: Normal  Judgement:  Judgement: Normal  Reality Testing:  Reality Testing: Adequate  Insight:  Insight: Good  Decision Making:  Decision Making: Normal  Social Functioning  Social Maturity:  Social Maturity: Isolates  Social Judgement:  Social Judgement: Normal  Stress  Stressors:  Stressors: Grief/losses  Coping Ability:  Coping Ability: Normal  Skill Deficits:   Grief  Supports:   Family    Family and Psychosocial History: Family history Marital status: Widowed Widowed, when?: Dec. 2018  Are you sexually active?: No What is your sexual orientation?: Heterosexual Has your sexual activity been affected by drugs, alcohol, medication, or emotional stress?: N/A  Does patient have children?: Yes How many children?: 2 How  is patient's relationship with their children?: Son  and daughter, Good relationship   Childhood History:  Childhood History By whom was/is the patient raised?: Both parents Additional childhood history information: Patient reports a good childhood  Description of patient's relationship with caregiver when they were a child: Mother: good relationship, Father: good relationship Patient's description of current relationship with people who raised him/her: Deceased  How were you disciplined when you got in trouble as a child/adolescent?: switch, talked to  Does patient have siblings?: Yes Number of Siblings: 3 Description of patient's current relationship with siblings: Sister, 2 brothers, Good relationship  Did patient suffer any verbal/emotional/physical/sexual abuse as a child?: No Did patient suffer from severe childhood neglect?: No Has patient ever been sexually abused/assaulted/raped as an adolescent or adult?: No Was the patient ever a victim of a crime or a disaster?: No Witnessed domestic violence?: No Has patient been effected by domestic violence as an adult?: No  CCA Part Two B  Employment/Work Situation: Employment / Work Situation Employment situation: On disability Why is patient on disability: Degenerative bone disease How long has patient been on disability: 2013 Patient's job has been impacted by current illness: No What is the longest time patient has a held a job?: 10 years Where was the patient employed at that time?: [employer]  Has patient ever been in the [employer]?: Yes (Describe in comment)([employer] ) Has patient ever served in combat?: No Did You Receive Any Psychiatric Treatment/Services While in Passenger transport manager?: No Are There Guns or Other Weapons in [place]?: No  Education: Museum/gallery curator Currently Attending: N/A: Adult  Last Grade Completed: 11 Name of [place]: [employer]  Did Teacher, adult education From [employer]?: No Did You Nutritional therapist?:  No Did Heritage manager?: No Did You Have Any Special Interests In School?: Science, math, PE  Did You Have An Individualized Education Program (IIEP): No Did You Have Any Difficulty At School?: No  Religion: Religion/Spirituality Are You A Religious Person?: Yes What is Your Religious Affiliation?: Baptist How Might This Affect Treatment?: Support in treatment   Leisure/Recreation: Leisure / Recreation Leisure and Hobbies: Plant a garden, Advertising copywriter, fish, being with his wife   Exercise/Diet: Exercise/Diet Do You Exercise?: No Have You Gained or Lost A Significant Amount of Weight in the Past Six Months?: No Do You Follow a Special Diet?: No Do You Have Any Trouble Sleeping?: Yes Explanation of Sleeping Difficulties: Less activity during the day makes it difficult to sleep through the night   CCA Part Two C  Alcohol/Drug Use: Alcohol / Drug Use Pain Medications: See patient record Prescriptions: See patient record Over the Counter: See patient record  History of alcohol / drug use?: No history of alcohol / drug abuse                      CCA Part Three  ASAM's:  Six Dimensions of Multidimensional Assessment  Dimension 1:  Acute Intoxication and/or Withdrawal Potential:  Dimension 1:  Comments: None  Dimension 2:  Biomedical Conditions and Complications:  Dimension 2:  Comments: None  Dimension 3:  Emotional, Behavioral, or Cognitive Conditions and Complications:  Dimension 3:  Comments: None  Dimension 4:  Readiness to Change:  Dimension 4:  Comments: None  Dimension 5:  Relapse, Continued use, or Continued Problem Potential:  Dimension 5:  Comments: None  Dimension 6:  Recovery/Living Environment:  Dimension 6:  Recovery/Living Environment Comments: None   Substance use Disorder (SUD)    Social Function:  Social  Functioning Social Maturity: Isolates Social Judgement: Normal  Stress:  Stress Stressors: Grief/losses Coping Ability: Normal Patient  Takes Medications The Way The Doctor Instructed?: Yes Priority Risk: Low Acuity  Risk Assessment- Self-Harm Potential: Risk Assessment For Self-Harm Potential Thoughts of Self-Harm: No current thoughts Method: No plan Availability of Means: No access/NA  Risk Assessment -Dangerous to Others Potential: Risk Assessment For Dangerous to Others Potential Method: No Plan Availability of Means: No access or NA Intent: Vague intent or NA Notification Required: No need or identified person  DSM5 Diagnoses: Patient Active Problem List   Diagnosis Date Noted  . Abdominal pain in male 06/04/2017  . Heme positive stool   . Serrated adenoma of colon 04/10/2017  . Coronary artery calcification 08/09/2016  . Essential hypertension 08/09/2016  . Anal skin tag 08/23/2015  . Erosive esophagitis 05/05/2015  . PUD (peptic ulcer disease) 05/05/2015  . Collagenous colitis 05/05/2015  . Dysphagia 05/05/2015  . HCAP (healthcare-associated pneumonia) 04/23/2014  . Arthritis 04/23/2014  . COPD (chronic obstructive pulmonary disease) ([place]) 04/23/2014  . Tobacco abuse 04/23/2014  . Hypercholesterolemia 04/23/2014  . Pneumococcal pneumonia ([place]) 04/23/2014  . Bursitis of elbow     Patient Centered Plan: Patient is on the following Treatment Plan(s):  Anxiety and Depression  Recommendations for Services/Supports/Treatments: Recommendations for Services/Supports/Treatments Recommendations For Services/Supports/Treatments: Individual Therapy, Medication Management  Treatment Plan Summary: OP Treatment Plan Summary: Garrett Klein will reduce symptoms of depression as evidenced by attending to own physical health, adjusting to his "new normal," and process grief for 5 out of 7 days for 60 days.   Referrals to Alternative Service(s): Referred to Alternative Service(s):   Place:   Date:   Time:    Referred to Alternative Service(s):   Place:   Date:   Time:    Referred to Alternative Service(s):   Place:    Date:   Time:    Referred to Alternative Service(s):   Place:   Date:   Time:     Glori Bickers, lCSW

## 2018-03-23 NOTE — Progress Notes (Signed)
Cardiology Office Note  Date: 03/24/2018   ID: MYKAEL BATZ, DOB [birth year], MRN [id number]  PCP: [name], MD  Primary Cardiologist: Rozann Lesches, MD   Chief Complaint  Patient presents with  . Cardiac follow-up    History of Present Illness: Garrett Klein is a 62 y.o. male not seen since September 2017.  He presents for a follow-up visit.  He tells me that his wife recently passed away after a bout with pancreatic cancer.  He has been under a lot of stress, still grieving, has had poor appetite and lost a significant amount of weight over the last year.  He does tell me that he is following with Dr. Manuella Ghazi and has seen a nutritionist as well as a behavioral health specialist.  Cardiac perspective he reports no obvious angina symptoms or worsening shortness of breath.  No palpitations or syncope. He has a history of coronary artery calcifications based on chest CT imaging, but no known obstructive CAD or myocardial infarction.  Echocardiogram and lower extremity arterial studies from 2017 are outlined below.  I reviewed his medications.  He states that he took himself off Lipitor.  He was concerned about possible side effects.  He did have lab work to the [facility] hospital system which he plans to bring by.  I reviewed his most recent ECG from November 2018.  Past Medical History:  Diagnosis Date  . Chronic lower back pain   . Collagenous colitis [address] [place]  . COPD (chronic obstructive pulmonary disease) ([facility])   . Essential hypertension   . Hyperlipidemia   . MRSA infection   . PUD (peptic ulcer disease)     Past Surgical History:  Procedure Laterality Date  . BACK SURGERY    . BIOPSY  06/03/2017   Procedure: BIOPSY;  Surgeon: Danie Binder, MD;  [facility];  Service: Endoscopy;;  colon  . COLONOSCOPY  2001   Dr. Sharlett Iles, normal  . COLONOSCOPY  04/2013   [facility]: Fentanyl 200mg Danford Bad 10mg : sigmoid diverticulosis, normal terminal ileum, small  internal hemorrhoids, random colon bx: collagenous colitis. next tcs 04/2018  . COLONOSCOPY  2011   [facility]: two polyps, sessile serrated adenoma, mild diverticulosis  . COLONOSCOPY WITH PROPOFOL N/A 06/03/2017   Procedure: COLONOSCOPY WITH PROPOFOL;  Surgeon: Danie Binder, MD;  [facility];  Service: Endoscopy;  Laterality: N/A;  10:00am  . ELBOW BURSA SURGERY    . ESOPHAGEAL DILATION N/A 05/19/2015   Procedure: ESOPHAGEAL DILATION;  Surgeon: Danie Binder, MD;  [facility];  Service: Endoscopy;  Laterality: N/A;  . ESOPHAGOGASTRODUODENOSCOPY N/A 10/28/2014   [facility]: 1. Esophagitis due to GERD. KOH neg. 2. Small hiatal hernia 3. Moderate erosive gastritis 4. RUQ pain due to large ulcer 5. [place] duodentis in the bulb.   . ESOPHAGOGASTRODUODENOSCOPY N/A 05/19/2015   [facility]: 1. Mild distal esophagitis 2. Peptic stricture at the gastroesophageal junction 3. Mild non-erosive gastritis 4. Pseudo pylorus due to prior PUD.   Marland Kitchen ESOPHAGOGASTRODUODENOSCOPY N/A 03/07/2017   Procedure: ESOPHAGOGASTRODUODENOSCOPY (EGD);  Surgeon: Danie Binder, MD;  [facility];  Service: Endoscopy;  Laterality: N/A;  200  . FINGER AMPUTATION Left 1985   index finger  . JOINT REPLACEMENT     left hip surgery - January 26, 2014  . POLYPECTOMY  06/03/2017   Procedure: POLYPECTOMY;  Surgeon: Danie Binder, MD;  [facility];  Service: Endoscopy;;  colon  .  ROTATOR CUFF REPAIR     November 2014  . SAVORY DILATION N/A 03/07/2017   Procedure: SAVORY DILATION;  Surgeon: Danie Binder, MD;  [facility];  Service: Endoscopy;  Laterality: N/A;    Current Outpatient Medications  Medication Sig Dispense Refill  . albuterol (PROVENTIL HFA;VENTOLIN HFA) 108 (90 BASE) MCG/ACT inhaler Inhale 2 puffs into the lungs every 6 (six) hours as needed for wheezing or shortness of breath.    Marland Kitchen albuterol (PROVENTIL) (2.5 MG/3ML) 0.083% nebulizer solution Take 2.5 mg by nebulization every 6  (six) hours as needed for wheezing or shortness of breath.    . ALPRAZolam (XANAX) 0.5 MG tablet Take 1 tablet by mouth 2 (two) times daily as needed for anxiety or sleep.   0  . budesonide (ENTOCORT EC) 3 MG 24 hr capsule 2 PO DAILY (Patient taking differently: Take 6 mg by mouth daily. ) 60 capsule 5  . budesonide-formoterol (SYMBICORT) 160-4.5 MCG/ACT inhaler Inhale 2 puffs into the lungs 2 (two) times daily.    . cholecalciferol (VITAMIN D) 1000 UNITS tablet Take 4,000 Units by mouth daily.     Marland Kitchen EPINEPHrine 0.3 mg/0.3 mL IJ SOAJ injection Inject 0.3 mg into the muscle as needed (allergic reaction).    Marland Kitchen escitalopram (LEXAPRO) 10 MG tablet Take 10 mg by mouth daily.  1  . lisinopril-hydrochlorothiazide (PRINZIDE,ZESTORETIC) 10-12.5 MG tablet Take 1 tablet by mouth daily.    Marland Kitchen omeprazole (PRILOSEC) 20 MG capsule 1 PO 30 MINS BEFORE YOUR FIRST MEAL EVERY DAY FOREVER (Patient taking differently: Take 20 mg by mouth daily as needed. 30 minutes before breakfast) 30 capsule 11  . sildenafil (VIAGRA) 100 MG tablet Take 100 mg by mouth daily as needed for erectile dysfunction.    Marland Kitchen umeclidinium bromide (INCRUSE ELLIPTA) 62.5 MCG/INH AEPB Inhale 1 puff into the lungs every morning.     Marland Kitchen atorvastatin (LIPITOR) 80 MG tablet Take 40 mg by mouth every evening.      No current facility-administered medications for this visit.    Allergies:  Bee venom; Ciprofloxacin; Hydrocodone; Levaquin [levofloxacin in d5w]; Penicillins; Tramadol; and Sulfa antibiotics   Social History: The patient  reports that he has been smoking cigarettes.  He started smoking about 49 years ago. He has a 20.00 pack-year smoking history. He has never used smokeless tobacco. He reports that he does not drink alcohol or use drugs.   ROS:  Please see the history of present illness. Otherwise, complete review of systems is positive for grief reaction.  All other systems are reviewed and negative.   Physical Exam: VS:  BP 100/67    Pulse 86   Ht 5\' 9"  (1.753 m)   Wt 144 lb (65.3 kg)   SpO2 97%   BMI 21.27 kg/m , BMI Body mass index is 21.27 kg/m.  Wt Readings from Last 3 Encounters:  03/24/18 144 lb (65.3 kg)  03/02/18 150 lb (68 kg)  09/30/17 176 lb (79.8 kg)    General: Chronically ill-appearing male, no distress. HEENT: Conjunctiva and lids normal, oropharynx clear. Neck: Supple, no elevated JVP or carotid bruits, no thyromegaly. Lungs: Clear to auscultation, nonlabored breathing at rest. Cardiac: Regular rate and rhythm, no S3, no systolic murmur. Abdomen: Soft, nontender, bowel sounds present. Extremities: No pitting edema, distal pulses 2+. Skin: Warm and dry. Musculoskeletal: No kyphosis. Neuropsychiatric: Alert and oriented x3, affect grossly appropriate.  ECG: I personally reviewed the tracing from 09/30/2017 which showed sinus rhythm with right bundle branch block.  Recent Labwork: 03/02/2018: ALT 16; AST 16; BUN 29; Creatinine, Ser 1.57; Hemoglobin 16.1; Platelets 335; Potassium 4.5; Sodium 133   Other Studies Reviewed Today:  Lexiscan Myoview 08/08/2015 [facility]): No evidence of scar or ischemia with LVEF 59%.  Chest CTA 10/09/2014: IMPRESSION: No evidence of acute abnormality. No evidence of pulmonary emboli or thoracic aortic aneurysm.  Echocardiogram 08/29/2016: Study Conclusions  - Left ventricle: The cavity size was normal. Wall thickness was   increased in a pattern of mild LVH. Systolic function was normal.   The estimated ejection fraction was in the range of 60% to 65%.   Wall motion was normal; there were no regional wall motion   abnormalities. Doppler parameters are consistent with abnormal   left ventricular relaxation (grade 1 diastolic dysfunction). - Aortic valve: Trileaflet; mildly calcified leaflets. There was   trivial regurgitation. - Mitral valve: There was trivial regurgitation. - Right atrium: Central venous pressure (est): 3 mm Hg. - Tricuspid valve: There  was trivial regurgitation. - Pulmonary arteries: Systolic pressure could not be accurately   estimated. - Pericardium, extracardiac: There was no pericardial effusion.  Impressions:  - Mild LVH with LVEF 60-65%. Grade 1 diastolic dysfunction. Trivial   mitral regurgitation. Mildly sclerotic aortic valve with trivial   aortic regurgitation. Trivial tricuspid regurgitation.  Lower extremity arterial Dopplers 08/29/2016: Normal ABIs (right 1.3 and left 1.2) with no evidence of segmental lower extremity arterial disease at rest.  Assessment and Plan:  1.  Coronary artery calcifications by previous chest CT imaging.  He reports no obvious angina at this time.  I reviewed his interval ECG.  Recommend continuing daily aspirin 81 mg.  I also would like to review his last lipid panel.  Ideally aggressive lipid management on at least a low-dose statin as tolerated would be recommended.  2.  Ongoing tobacco abuse.  Smoking cessation has been discussed.  3.  Essential hypertension, on Prinzide.  Blood pressure low normal today.  4.  History of hyperlipidemia.  Patient took himself off Lipitor, not entirely clear that he was having active side effects, but was concerned about the possibility.  Current medicines were reviewed with the patient today.  Disposition: Follow-up in 1 year.  Signed, Satira Sark, MD, [facility] 03/24/2018 3:30 PM    [facility], [place], [place] [address] Phone: [phone]; Fax: [phone]

## 2018-03-24 ENCOUNTER — Encounter: Payer: Self-pay | Admitting: Cardiology

## 2018-03-24 ENCOUNTER — Other Ambulatory Visit: Payer: Self-pay

## 2018-03-24 ENCOUNTER — Ambulatory Visit (INDEPENDENT_AMBULATORY_CARE_PROVIDER_SITE_OTHER): Payer: Medicare Other | Admitting: Cardiology

## 2018-03-24 VITALS — BP 100/67 | HR 86 | Ht 69.0 in | Wt 144.0 lb

## 2018-03-24 DIAGNOSIS — Z720 Tobacco use: Secondary | ICD-10-CM | POA: Diagnosis not present

## 2018-03-24 DIAGNOSIS — I10 Essential (primary) hypertension: Secondary | ICD-10-CM

## 2018-03-24 DIAGNOSIS — E782 Mixed hyperlipidemia: Secondary | ICD-10-CM

## 2018-03-24 DIAGNOSIS — I2510 Atherosclerotic heart disease of native coronary artery without angina pectoris: Secondary | ICD-10-CM

## 2018-03-24 NOTE — Patient Instructions (Signed)

## 2018-04-16 ENCOUNTER — Ambulatory Visit (HOSPITAL_COMMUNITY): Payer: Self-pay | Admitting: Licensed Clinical Social Worker

## 2018-04-22 ENCOUNTER — Ambulatory Visit: Payer: Medicare Other | Admitting: Gastroenterology

## 2018-04-23 ENCOUNTER — Encounter (HOSPITAL_COMMUNITY): Payer: Self-pay | Admitting: Licensed Clinical Social Worker

## 2018-04-23 ENCOUNTER — Ambulatory Visit (INDEPENDENT_AMBULATORY_CARE_PROVIDER_SITE_OTHER): Payer: Medicare Other | Admitting: Licensed Clinical Social Worker

## 2018-04-23 ENCOUNTER — Ambulatory Visit (HOSPITAL_COMMUNITY): Payer: Medicare Other | Admitting: Licensed Clinical Social Worker

## 2018-04-23 DIAGNOSIS — F4321 Adjustment disorder with depressed mood: Secondary | ICD-10-CM

## 2018-04-23 DIAGNOSIS — F4323 Adjustment disorder with mixed anxiety and depressed mood: Secondary | ICD-10-CM

## 2018-04-23 NOTE — Progress Notes (Signed)
   THERAPIST PROGRESS NOTE  Session Time: 11:00 am-11:40 am  Participation Level: Active  Behavioral Response: CasualAlertDepressed  Type of Therapy: Individual Therapy  Treatment Goals addressed: Coping  Interventions: CBT and Solution Focused  Summary: Garrett Klein is a 62 y.o. male who presents oriented x5 (person, place, situation, time and object) alert, tearful, average height, average weigh, appropriately groomed, casually dressed, and cooperative) to address grief. Patient has a history of medical treatment including COPD, hypertension and chronic back pain. Patient has no history of mental health treatment. Patient denies suicidal and homicidal ideations. Patient denies psychosis including auditory and visual hallucinations. Patient denies substance abuse. Patient is at low risk for lethality.  Physically: Patient feels worn out. He is having difficulty sleeping due to waking up constantly. His appetite has improved some.  Spiritually/values: Patient continues to be spiritually healthy but questions why his wife had to pass away.  Relationships: Patient has good relationships with family.  Emotional/Mental/Behavior: Patient continues to feel sad and experience grief. Patient thinks about his wife constantly because they spent all their time together. Patient shared several memories about his wife. Patient noted that he has to force himself to do things such as leave the house. He doesn't want to interact with people but forces himself to do so.   Patient engaged in session. She responded well to interventions. Patient continues to meet criteria for Adjustment disorder with anxiety and depression. Patient will continue in outpatient therapy due to being the least restrictive service to meet his needs. Patient made minimal progress on his goals at this time.   Suicidal/Homicidal: Negativewithout intent/plan  Therapist Response: Therapist reviewed patient's recent thoughts and  behavior. Therapist utilized CBT to address mood and grief. Therapist processed patient's feelings to identify triggers for mood. Therapist discussed patient's mood and his experiences with grief.   Plan: Return again in 2 weeks.  Diagnosis: Axis I: Adjustment Disorder with Mixed Emotional Features    Axis II: No diagnosis    Glori Bickers, LCSW 04/23/2018

## 2018-04-30 NOTE — Progress Notes (Signed)
Psychiatric Initial Adult Assessment   Patient Identification: Garrett Klein MRN:  [id number] Date of Evaluation:  05/05/2018 Referral Source: Arrie Eastern, LCSW Chief Complaint:"I want to get through this, and but I don't want to get over this."   Visit Diagnosis:    ICD-10-CM   1. Current moderate episode of major depressive disorder without prior episode ([place]) F32.1     History of Present Illness:   Garrett Klein is a 61 y.o. year old male with a history of adjustment disorder, hypertension, history of hyperlipidemia, collagenous colitis, who is referred for adjustment disorder.   Patient states that he lost his wife from pancreatic cancer in December 17 th, 2018. He still cannot believe that she passed away when he wakes up in the morning. He states that "I want to get through this, and but I don't want to get over this." He feels scared that he saw his wife at home after she passed away. He moved into this brother's house as the house reminds him of his wife. He meets with his brother and other siblings every day. Although they have been trying to help him, patient has significant anhedonia. He states that the house most of the time.  He finds it difficult to even take a shower in the morning.  He stopped fishing, although he used to enjoy it. Although he still goes to church, it reminds him of his wife as they used to go together. He has lost weight over 40 pounds. (used to be 198 lbs). He has started to see a dietician. He was recommended by his PCP to drink more ensure. He also starts to see a grief counseling.   He has insomnia to hypersomnia.  He feels fatigue.  He has no energy.  He has difficulty in concentration and complains of memory loss.  He has appetite loss.  He denies SI.  He feels anxious and tense.  He occasionally has panic attacks. He denies alcohol use or drug use. He takes xanax 0.5 mg BID. He wished his primary care to prescribe more so that he does not need to go there  every week.   Per PMP . Xanax filled on 04/23/2018 for five days,   Wt Readings from Last 3 Encounters:  05/05/18 153 lb (69.4 kg)  03/24/18 144 lb (65.3 kg)  03/02/18 150 lb (68 kg)   Wt 79.8 kg (176 lb) 09/2017  Associated Signs/Symptoms: Depression Symptoms:  depressed mood, anhedonia, insomnia, fatigue, (Hypo) Manic Symptoms:  denies decreased need for sleep, euphoria Anxiety Symptoms:  Panic Symptoms, Psychotic Symptoms:  denies AH, VH, paranoia PTSD Symptoms: NA  Past Psychiatric History:  Outpatient: denies Psychiatry admission: denies Previous suicide attempt: denies Past trials of medication: venlafaxine  History of violence:   Previous Psychotropic Medications: Yes   Substance Abuse History in the last 12 months:  No.  Consequences of Substance Abuse: Negative  Past Medical History:  Past Medical History:  Diagnosis Date  . Chronic lower back pain   . Collagenous colitis [address] [place]  . COPD (chronic obstructive pulmonary disease) ([place])   . Essential hypertension   . Hyperlipidemia   . MRSA infection   . PUD (peptic ulcer disease)     Past Surgical History:  Procedure Laterality Date  . BACK SURGERY    . BIOPSY  06/03/2017   Procedure: BIOPSY;  Surgeon: Danie Binder, MD;  [facility];  Service: Endoscopy;;  colon  . COLONOSCOPY  2001   Dr. Sharlett Iles, normal  . COLONOSCOPY  04/2013   [facility]: Fentanyl 200mg Danford Bad 10mg : sigmoid diverticulosis, normal terminal ileum, small internal hemorrhoids, random colon bx: collagenous colitis. next tcs 04/2018  . COLONOSCOPY  2011   [facility]: two polyps, sessile serrated adenoma, mild diverticulosis  . COLONOSCOPY WITH PROPOFOL N/A 06/03/2017   Procedure: COLONOSCOPY WITH PROPOFOL;  Surgeon: Danie Binder, MD;  [facility];  Service: Endoscopy;  Laterality: N/A;  10:00am  . ELBOW BURSA SURGERY    . ESOPHAGEAL DILATION N/A 05/19/2015   Procedure: ESOPHAGEAL DILATION;  Surgeon:  Danie Binder, MD;  [facility];  Service: Endoscopy;  Laterality: N/A;  . ESOPHAGOGASTRODUODENOSCOPY N/A 10/28/2014   [facility]: 1. Esophagitis due to GERD. KOH neg. 2. Small hiatal hernia 3. Moderate erosive gastritis 4. RUQ pain due to large ulcer 5. [place] duodentis in the bulb.   . ESOPHAGOGASTRODUODENOSCOPY N/A 05/19/2015   [facility]: 1. Mild distal esophagitis 2. Peptic stricture at the gastroesophageal junction 3. Mild non-erosive gastritis 4. Pseudo pylorus due to prior PUD.   Marland Kitchen ESOPHAGOGASTRODUODENOSCOPY N/A 03/07/2017   Procedure: ESOPHAGOGASTRODUODENOSCOPY (EGD);  Surgeon: Danie Binder, MD;  [facility];  Service: Endoscopy;  Laterality: N/A;  200  . FINGER AMPUTATION Left 1985   index finger  . JOINT REPLACEMENT     left hip surgery - January 26, 2014  . POLYPECTOMY  06/03/2017   Procedure: POLYPECTOMY;  Surgeon: Danie Binder, MD;  [facility];  Service: Endoscopy;;  colon  . ROTATOR CUFF REPAIR     November 2014  . SAVORY DILATION N/A 03/07/2017   Procedure: SAVORY DILATION;  Surgeon: Danie Binder, MD;  [facility];  Service: Endoscopy;  Laterality: N/A;    Family Psychiatric History:  Denies   Family History:  Family History  Problem Relation Age of Onset  . Cancer Mother   . Diabetes type II Father   . Heart attack Father   . Arthritis Sister   . Stroke Brother   . Coronary artery disease Brother   . Diabetes Other   . Heart attack Unknown   . Colon cancer Neg Hx   . Liver disease Neg Hx   . Ulcers Neg Hx     Social History:   Social History   Socioeconomic History  . Marital status: Widowed    Spouse name: Not on file  . Number of children: Not on file  . Years of education: Not on file  . Highest education level: Not on file  Occupational History  . Not on file  Social Needs  . Financial resource strain: Not on file  . Food insecurity:    Worry: Not on file    Inability: Not on file  . Transportation needs:     Medical: Not on file    Non-medical: Not on file  Tobacco Use  . Smoking status: Current Every Day Smoker    Packs/day: 0.50    Years: 40.00    Pack years: 20.00    Types: Cigarettes    Start date: 01/09/1969  . Smokeless tobacco: Never Used  Substance and Sexual Activity  . Alcohol use: No    Alcohol/week: 0.0 oz  . Drug use: No  . Sexual activity: Not on file  Lifestyle  . Physical activity:    Days per week: Not on file    Minutes per session: Not on file  . Stress: Not on file  Relationships  . Social connections:    Talks on phone: Not on file    Gets together: Not on file    Attends religious service: Not on file    Active member of club or organization: Not on file    Attends meetings of clubs or organizations: Not on file    Relationship status: Not on file  Other Topics Concern  . Not on file  Social History Narrative  . Not on file    Additional Social History:  He reports that he had "good, crazy, wild (smiling)" childhood. He reports good relationship with his parents, he has two brothers and one sister.  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 He is on disability Widowed after 61 years of marriage. His wife deceased in 11-27-2017. He has two sons from prior marriage. His wife has oe son.   Allergies:   Allergies  Allergen Reactions  . Bee Venom Swelling  . Ciprofloxacin Other (See Comments)    Turns red from the chest up.  Marland Kitchen Hydrocodone     Rash   . Levaquin [Levofloxacin In D5w]     CANDIDA ALL OVER MOUTH TO PENIS  . Penicillins Other (See Comments)    Reaction in childhood, caused patient to pass out Has patient had a PCN reaction causing immediate rash, facial/tongue/throat swelling, SOB or lightheadedness with hypotension:Yes Has patient had a PCN reaction causing severe rash involving mucus membranes or skin necrosis: No Has patient had a PCN reaction that required hospitalization: No Has patient had  a PCN reaction occurring within the last 10 years: No If all of the above answers are "NO", then may proceed with Cephalosporin use.   . Tramadol     Hives   . Sulfa Antibiotics Rash    Metabolic Disorder Labs: No results found for: HGBA1C, MPG No results found for: PROLACTIN No results found for: CHOL, TRIG, HDL, CHOLHDL, VLDL, LDLCALC   Current Medications: Current Outpatient Medications  Medication Sig Dispense Refill  . albuterol (PROVENTIL HFA;VENTOLIN HFA) 108 (90 BASE) MCG/ACT inhaler Inhale 2 puffs into the lungs every 6 (six) hours as needed for wheezing or shortness of breath.    Marland Kitchen albuterol (PROVENTIL) (2.5 MG/3ML) 0.083% nebulizer solution Take 2.5 mg by nebulization every 6 (six) hours as needed for wheezing or shortness of breath.    . ALPRAZolam (XANAX) 0.5 MG tablet Take 1 tablet (0.5 mg total) by mouth 2 (two) times daily as needed for anxiety or sleep. 60 tablet 0  . atorvastatin (LIPITOR) 80 MG tablet Take 40 mg by mouth every evening.     . budesonide (ENTOCORT EC) 3 MG 24 hr capsule 2 PO DAILY (Patient taking differently: Take 6 mg by mouth daily. ) 60 capsule 5  . budesonide-formoterol (SYMBICORT) 160-4.5 MCG/ACT inhaler Inhale 2 puffs into the lungs 2 (two) times daily.    . cholecalciferol (VITAMIN D) 1000 UNITS tablet Take 4,000 Units by mouth daily.     Marland Kitchen EPINEPHrine 0.3 mg/0.3 mL IJ SOAJ injection Inject 0.3 mg into the muscle as needed (allergic reaction).    Marland Kitchen escitalopram (LEXAPRO) 10 MG tablet Take 1 tablet (10 mg total) by mouth daily. 30 tablet 0  . gabapentin (NEURONTIN) 400 MG capsule Take 400 mg by mouth 4 (four) times daily.    Marland Kitchen lisinopril-hydrochlorothiazide (PRINZIDE,ZESTORETIC) 10-12.5 MG tablet Take 1 tablet by mouth daily.    Marland Kitchen omeprazole (PRILOSEC) 20 MG capsule 1 PO 30 MINS BEFORE  YOUR FIRST MEAL EVERY DAY FOREVER (Patient taking differently: Take 20 mg by mouth daily as needed. 30 minutes before breakfast) 30 capsule 11  . sildenafil  (VIAGRA) 100 MG tablet Take 100 mg by mouth daily as needed for erectile dysfunction.    Marland Kitchen umeclidinium bromide (INCRUSE ELLIPTA) 62.5 MCG/INH AEPB Inhale 1 puff into the lungs every morning.     . mirtazapine (REMERON) 15 MG tablet 7.5 mg at night for one week, then 15 mg at night 30 tablet 0   No current facility-administered medications for this visit.     Neurologic: Headache: No Seizure: No Paresthesias:No  Musculoskeletal: Strength & Muscle Tone: within normal limits Gait & Station: normal Patient leans: N/A  Psychiatric Specialty Exam: Review of Systems  Musculoskeletal: Positive for joint pain.  Psychiatric/Behavioral: Positive for depression and memory loss. Negative for hallucinations, substance abuse and suicidal ideas. The patient is nervous/anxious and has insomnia.   All other systems reviewed and are negative.   Blood pressure 125/83, pulse 81, height 5\' 9"  (1.753 m), weight 153 lb (69.4 kg), SpO2 99 %.Body mass index is 22.59 kg/m.  General Appearance: Fairly Groomed  Eye Contact:  Good  Speech:  Clear and Coherent  Volume:  Normal  Mood:  Depressed  Affect:  Appropriate, Congruent, Restricted, Tearful and down  Thought Process:  Coherent  Orientation:  Full (Time, Place, and Person)  Thought Content:  Logical  Suicidal Thoughts:  No  Homicidal Thoughts:  No  Memory:  Immediate;   Good  Judgement:  Good  Insight:  Good  Psychomotor Activity:  Normal  Concentration:  Concentration: Good and Attention Span: Good  Recall:  Good  Fund of Knowledge:Good  Language: Good  Akathisia:  No  Handed:  Right  AIMS (if indicated):  N/A  Assets:  Communication Skills Desire for Improvement  ADL's:  Intact  Cognition: WNL  Sleep:  poor   Assessment Garrett Klein is a 62 y.o. year old male with a history of adjustment disorder, hypertension, history of hyperlipidemia, collagenous colitis, who is referred for adjustment disorder.   # MDD, single episode without  psychotic features Patient endorses neurovegetative symptoms in the setting of grief of loss of his wife from pancreatic cancer.  Will continue Lexapro to target depression given he reports benefit from this medication.  Will add mirtazapine as adjunctive treatment for depression, and also to help for significant weight loss.  Discussed metabolic risk and oversedation.  Will continue Xanax as needed for anxiety at this time; he agrees to taper it off in the future given its risk of dependence and oversedation. (Patient did have accidental overdose in 09/2017 with xanax and opioid).  Discussed behavioral activation.  He sees Mr. Sheets for therapy.   Plan 1. Continue lexapro 10 mg daily  2. Start mirtazapine 7.5 mg at night for one week, then 15 mg at night  3. Continue Xanax 0.5 mg twice as needed for anxiety  4. Return to clinic in one month for 30 mins - Will obtain note from PCP at the next encounter. Will consider checking TSH if it is not done.   The patient demonstrates the following risk factors for suicide: Chronic risk factors for suicide include: psychiatric disorder of depression. Acute risk factors for suicide include: loss (financial, interpersonal, professional). Protective factors for this patient include: positive social support, coping skills and hope for the future. Considering these factors, the overall suicide risk at this point appears to be low. Patient is appropriate  for outpatient follow up.   Treatment Plan Summary: Plan as above   [name], MD 6/18/20194:54 PM

## 2018-05-05 ENCOUNTER — Encounter (HOSPITAL_COMMUNITY): Payer: Self-pay | Admitting: Psychiatry

## 2018-05-05 ENCOUNTER — Ambulatory Visit (INDEPENDENT_AMBULATORY_CARE_PROVIDER_SITE_OTHER): Payer: Medicare Other | Admitting: Psychiatry

## 2018-05-05 VITALS — BP 125/83 | HR 81 | Ht 69.0 in | Wt 153.0 lb

## 2018-05-05 DIAGNOSIS — F321 Major depressive disorder, single episode, moderate: Secondary | ICD-10-CM | POA: Insufficient documentation

## 2018-05-05 MED ORDER — ALPRAZOLAM 0.5 MG PO TABS: 0.5000 mg | ORAL_TABLET | Freq: Two times a day (BID) | ORAL | 0 refills | Status: DC | PRN

## 2018-05-05 MED ORDER — ESCITALOPRAM OXALATE 10 MG PO TABS: 10.0000 mg | ORAL_TABLET | Freq: Every day | ORAL | 0 refills | Status: DC

## 2018-05-05 MED ORDER — MIRTAZAPINE 15 MG PO TABS: ORAL_TABLET | ORAL | 0 refills | Status: DC

## 2018-05-05 NOTE — Patient Instructions (Signed)
1. Continue lexapro 10 mg daily  2. Start mirtazapine 7.5 mg at night for one week, then 15 mg at night  3. Continue Xanax 0.5 mg twice as needed for anxiety  4. Return to clinic in one month for 30 mins

## 2018-05-07 ENCOUNTER — Telehealth: Payer: Self-pay | Admitting: Gastroenterology

## 2018-05-07 ENCOUNTER — Encounter: Payer: Self-pay | Admitting: Gastroenterology

## 2018-05-07 ENCOUNTER — Ambulatory Visit: Payer: Medicare Other | Admitting: Gastroenterology

## 2018-05-07 ENCOUNTER — Ambulatory Visit (HOSPITAL_COMMUNITY): Payer: Medicare Other | Admitting: Licensed Clinical Social Worker

## 2018-05-07 NOTE — Telephone Encounter (Signed)
PATIENT WAS A NO SHOW AND LETTER SENT  °

## 2018-05-07 NOTE — Telephone Encounter (Signed)
REVIEWED-NO ADDITIONAL RECOMMENDATIONS. 

## 2018-05-20 ENCOUNTER — Ambulatory Visit (HOSPITAL_COMMUNITY): Payer: Medicare Other | Admitting: Licensed Clinical Social Worker

## 2018-06-02 ENCOUNTER — Telehealth (HOSPITAL_COMMUNITY): Payer: Self-pay

## 2018-06-02 NOTE — Progress Notes (Signed)
BH MD/PA/NP OP Progress Note  06/04/2018 3:35 PM Garrett Klein  MRN:  [id number]  Chief Complaint:  Chief Complaint    Follow-up; Anxiety; Depression     HPI:  The patient presents for follow-up appointment for depression.  He states that he has been feeling groggy and cloudy after starting mirtazapine.  He also has vague paranoia.  He had a panic attack yesterday when he visit his sister-in-law.  He states that she recommended him to see a video of his wife.  He states that although people advises him to talk about his wife, he does not want to do it.  He tends to get very tearful and overwhelmed thinking about his wife.  He moved out from brother's place early this months and lives by himself.  He believes that things are going well for him.  He has occasional insomnia.  He has fatigue at times.  He  has difficulty in concentration.  He denies appetite loss.  He denies SI. He takes xanax for anxiety.   Wt Readings from Last 3 Encounters:  06/04/18 154 lb (69.9 kg)  05/05/18 153 lb (69.4 kg)  03/24/18 144 lb (65.3 kg)   Per PMP,  Xanax filled on 05/05/2018    Visit Diagnosis:    ICD-10-CM   1. Current moderate episode of major depressive disorder without prior episode ([place]) F32.1 TSH    Past Psychiatric History: Please see initial evaluation for full details. I have reviewed the history. No updates at this time.     Past Medical History:  Past Medical History:  Diagnosis Date  . Chronic lower back pain   . Collagenous colitis [address] [place]  . COPD (chronic obstructive pulmonary disease) ([place])   . Essential hypertension   . Hyperlipidemia   . MRSA infection   . PUD (peptic ulcer disease)     Past Surgical History:  Procedure Laterality Date  . BACK SURGERY    . BIOPSY  06/03/2017   Procedure: BIOPSY;  Surgeon: Danie Binder, MD;  [facility];  Service: Endoscopy;;  colon  . COLONOSCOPY  2001   Dr. Sharlett Iles, normal  . COLONOSCOPY  04/2013   [facility]:  Fentanyl 200mg Danford Bad 10mg : sigmoid diverticulosis, normal terminal ileum, small internal hemorrhoids, random colon bx: collagenous colitis. next tcs 04/2018  . COLONOSCOPY  2011   [facility]: two polyps, sessile serrated adenoma, mild diverticulosis  . COLONOSCOPY WITH PROPOFOL N/A 06/03/2017   Procedure: COLONOSCOPY WITH PROPOFOL;  Surgeon: Danie Binder, MD;  [facility];  Service: Endoscopy;  Laterality: N/A;  10:00am  . ELBOW BURSA SURGERY    . ESOPHAGEAL DILATION N/A 05/19/2015   Procedure: ESOPHAGEAL DILATION;  Surgeon: Danie Binder, MD;  [facility];  Service: Endoscopy;  Laterality: N/A;  . ESOPHAGOGASTRODUODENOSCOPY N/A 10/28/2014   [facility]: 1. Esophagitis due to GERD. KOH neg. 2. Small hiatal hernia 3. Moderate erosive gastritis 4. RUQ pain due to large ulcer 5. [place] duodentis in the bulb.   . ESOPHAGOGASTRODUODENOSCOPY N/A 05/19/2015   [facility]: 1. Mild distal esophagitis 2. Peptic stricture at the gastroesophageal junction 3. Mild non-erosive gastritis 4. Pseudo pylorus due to prior PUD.   Marland Kitchen ESOPHAGOGASTRODUODENOSCOPY N/A 03/07/2017   Procedure: ESOPHAGOGASTRODUODENOSCOPY (EGD);  Surgeon: Danie Binder, MD;  [facility];  Service: Endoscopy;  Laterality: N/A;  200  . FINGER AMPUTATION Left 1985   index finger  . JOINT REPLACEMENT     left hip  surgery - January 26, 2014  . POLYPECTOMY  06/03/2017   Procedure: POLYPECTOMY;  Surgeon: Danie Binder, MD;  [facility];  Service: Endoscopy;;  colon  . ROTATOR CUFF REPAIR     November 2014  . SAVORY DILATION N/A 03/07/2017   Procedure: SAVORY DILATION;  Surgeon: Danie Binder, MD;  [facility];  Service: Endoscopy;  Laterality: N/A;    Family Psychiatric History: Please see initial evaluation for full details. I have reviewed the history. No updates at this time.     Family History:  Family History  Problem Relation Age of Onset  . Cancer Mother   . Diabetes type II Father   .  Heart attack Father   . Arthritis Sister   . Stroke Brother   . Coronary artery disease Brother   . Diabetes Other   . Heart attack Unknown   . Colon cancer Neg Hx   . Liver disease Neg Hx   . Ulcers Neg Hx     Social History:  Social History   Socioeconomic History  . Marital status: Widowed    Spouse name: Not on file  . Number of children: Not on file  . Years of education: Not on file  . Highest education level: Not on file  Occupational History  . Not on file  Social Needs  . Financial resource strain: Not on file  . Food insecurity:    Worry: Not on file    Inability: Not on file  . Transportation needs:    Medical: Not on file    Non-medical: Not on file  Tobacco Use  . Smoking status: Current Every Day Smoker    Packs/day: 0.50    Years: 40.00    Pack years: 20.00    Types: Cigarettes    Start date: 01/09/1969  . Smokeless tobacco: Never Used  Substance and Sexual Activity  . Alcohol use: No    Alcohol/week: 0.0 oz  . Drug use: No  . Sexual activity: Not on file  Lifestyle  . Physical activity:    Days per week: Not on file    Minutes per session: Not on file  . Stress: Not on file  Relationships  . Social connections:    Talks on phone: Not on file    Gets together: Not on file    Attends religious service: Not on file    Active member of club or organization: Not on file    Attends meetings of clubs or organizations: Not on file    Relationship status: Not on file  Other Topics Concern  . Not on file  Social History Narrative  . Not on file    Allergies:  Allergies  Allergen Reactions  . Bee Venom Swelling  . Ciprofloxacin Other (See Comments)    Turns red from the chest up.  Marland Kitchen Hydrocodone     Rash   . Levaquin [Levofloxacin In D5w]     CANDIDA ALL OVER MOUTH TO PENIS  . Penicillins Other (See Comments)    Reaction in childhood, caused patient to pass out Has patient had a PCN reaction causing immediate rash, facial/tongue/throat  swelling, SOB or lightheadedness with hypotension:Yes Has patient had a PCN reaction causing severe rash involving mucus membranes or skin necrosis: No Has patient had a PCN reaction that required hospitalization: No Has patient had a PCN reaction occurring within the last 10 years: No If all of the above answers are "NO", then may proceed  with Cephalosporin use.   . Tramadol     Hives   . Sulfa Antibiotics Rash    Metabolic Disorder Labs: No results found for: HGBA1C, MPG No results found for: PROLACTIN No results found for: CHOL, TRIG, HDL, CHOLHDL, VLDL, LDLCALC Lab Results  Component Value Date   TSH 0.829 08/18/2015   TSH 1.440 05/19/2015    Therapeutic Level Labs: No results found for: LITHIUM No results found for: VALPROATE No components found for:  CBMZ  Current Medications: Current Outpatient Medications  Medication Sig Dispense Refill  . albuterol (PROVENTIL HFA;VENTOLIN HFA) 108 (90 BASE) MCG/ACT inhaler Inhale 2 puffs into the lungs every 6 (six) hours as needed for wheezing or shortness of breath.    Marland Kitchen albuterol (PROVENTIL) (2.5 MG/3ML) 0.083% nebulizer solution Take 2.5 mg by nebulization every 6 (six) hours as needed for wheezing or shortness of breath.    . ALPRAZolam (XANAX) 0.5 MG tablet Take 1 tablet (0.5 mg total) by mouth 2 (two) times daily as needed for anxiety or sleep. 60 tablet 0  . atorvastatin (LIPITOR) 80 MG tablet Take 40 mg by mouth every evening.     . budesonide (ENTOCORT EC) 3 MG 24 hr capsule 2 PO DAILY (Patient taking differently: Take 6 mg by mouth daily. ) 60 capsule 5  . budesonide-formoterol (SYMBICORT) 160-4.5 MCG/ACT inhaler Inhale 2 puffs into the lungs 2 (two) times daily.    . cholecalciferol (VITAMIN D) 1000 UNITS tablet Take 4,000 Units by mouth daily.     Marland Kitchen EPINEPHrine 0.3 mg/0.3 mL IJ SOAJ injection Inject 0.3 mg into the muscle as needed (allergic reaction).    Marland Kitchen escitalopram (LEXAPRO) 10 MG tablet Take 1 tablet (10 mg total) by  mouth daily. 30 tablet 0  . gabapentin (NEURONTIN) 400 MG capsule Take 400 mg by mouth 4 (four) times daily.    Marland Kitchen lisinopril-hydrochlorothiazide (PRINZIDE,ZESTORETIC) 10-12.5 MG tablet Take 1 tablet by mouth daily.    . mirtazapine (REMERON) 15 MG tablet 7.5 mg at night for one week, then 15 mg at night 30 tablet 0  . omeprazole (PRILOSEC) 20 MG capsule 1 PO 30 MINS BEFORE YOUR FIRST MEAL EVERY DAY FOREVER (Patient taking differently: Take 20 mg by mouth daily as needed. 30 minutes before breakfast) 30 capsule 11  . sildenafil (VIAGRA) 100 MG tablet Take 100 mg by mouth daily as needed for erectile dysfunction.    Marland Kitchen umeclidinium bromide (INCRUSE ELLIPTA) 62.5 MCG/INH AEPB Inhale 1 puff into the lungs every morning.     . sertraline (ZOLOFT) 50 MG tablet 25 mg daily for one week, then 50 mg daily for one week, then 100 mg daily 60 tablet 0   No current facility-administered medications for this visit.      Musculoskeletal: Strength & Muscle Tone: within normal limits Gait & Station: normal Patient leans: N/A  Psychiatric Specialty Exam: Review of Systems  Psychiatric/Behavioral: Positive for depression. Negative for hallucinations, memory loss, substance abuse and suicidal ideas. The patient is nervous/anxious and has insomnia.   All other systems reviewed and are negative.   Blood pressure 116/79, pulse 96, height 5\' 9"  (1.753 m), weight 154 lb (69.9 kg), SpO2 98 %.Body mass index is 22.74 kg/m.  General Appearance: Fairly Groomed  Eye Contact:  Good  Speech:  Clear and Coherent  Volume:  Normal  Mood:  Depressed  Affect:  Appropriate, Congruent, Restricted, Tearful and down  Thought Process:  Coherent  Orientation:  Full (Time, Place, and Person)  Thought Content:  Logical   Suicidal Thoughts:  No  Homicidal Thoughts:  No  Memory:  Immediate;   Good  Judgement:  Good  Insight:  Fair  Psychomotor Activity:  Normal  Concentration:  Concentration: Good and Attention Span: Good   Recall:  Good  Fund of Knowledge: Good  Language: Good  Akathisia:  No  Handed:  Right  AIMS (if indicated): not done  Assets:  Communication Skills Desire for Improvement  ADL's:  Intact  Cognition: WNL  Sleep:  Fair   Screenings:   Assessment and Plan:  Garrett Klein is a 62 y.o. year old male with a history of depression,  hypertension, history of hyperlipidemia, collagenous colitis , who presents for follow up appointment for Current moderate episode of major depressive disorder without prior episode ([place]) - Plan: TSH  # MDD, single episode without psychotic features Patient continues to endorse neurovegetative symptoms and had a panic attack in the context of loss of his wife from pancreatic cancer.  Will switch from Lexapro to sertraline to target depression and anxiety.  Will discontinue mirtazapine given its potential side effect of drowsiness in the morning.  Will continue Xanax as needed for anxiety at this time; he agrees to taper it off in the future given its risk of dependence and oversedation. (Patient did have accidental overdose in 09/2017 with xanax and opioid).    Validated his grief. Discussed behavioral activation Plan I have reviewed and updated plans as below 1. Decrease lexapro 5 mg daily for one week, then discontinue 2. Start sertraline 25 mg daily for one week, then 50 mg daily for one week, then 100 mg daily  3. Discontinue mirtazapine  4. Continue Xanax 0.5 mg twice a day as needed for anxiety  5. Return to clinic in one month for 30 mins - Will check TSH at the next encounter  Past trials of medication: mirtazapine (drowsiness), venlafaxine, xanax,    The patient demonstrates the following risk factors for suicide: Chronic risk factors for suicide include: psychiatric disorder of depression. Acute risk factors for suicide include: loss (financial, interpersonal, professional). Protective factors for this patient include: positive social support,  coping skills and hope for the future. Considering these factors, the overall suicide risk at this point appears to be low. Patient is appropriate for outpatient follow up.  The duration of this appointment visit was 30 minutes of face-to-face time with the patient.  Greater than 50% of this time was spent in counseling, explanation of  diagnosis, planning of further management, and coordination of care.  [name], MD 06/04/2018, 3:35 PM

## 2018-06-02 NOTE — Telephone Encounter (Signed)
Patient called and said that since Friday 05-29-18, he has been experiencing paranoia, body shaking from the inside, and some light chest tightening. Patient also stated that today he felt exteremly weird,  he had to FORCE himself out of his apartment, and he also felt "scared" when he went out. Patient wants to know is this side effects from taking Remeron 15 mg? Please advise

## 2018-06-03 ENCOUNTER — Telehealth (HOSPITAL_COMMUNITY): Payer: Self-pay | Admitting: *Deleted

## 2018-06-03 NOTE — Telephone Encounter (Signed)
Patient called asking what his MD said about his previous call from earlier in the week. Explained what was suggested per notes and then patient asked if he could have a refill of his Xanax stating he took his last pill on 06/02/18. Feels it helps with his shaking and anxiety.

## 2018-06-03 NOTE — Telephone Encounter (Signed)
It is hard to tell if it is secondary to Remoron given I believe that the patient has been on that dose for a while. Advise him to taper down the dose to 7.5 mg if he believes it is related to remoron. Also advise him to be seen by PCP/ED if has any worsening in his chest tightening.

## 2018-06-03 NOTE — Telephone Encounter (Signed)
Called patient to make him aware that MD will see him tomorrow and discuss the Xanax but was unable to give a refill at this time. He understood and states he will discuss with her tomorrow. Also explained need for PCP/ED if he has any chest pain.

## 2018-06-03 NOTE — Telephone Encounter (Signed)
I believe he should have enough xanax until the tomorrow's appointment.

## 2018-06-04 ENCOUNTER — Encounter (HOSPITAL_COMMUNITY): Payer: Self-pay | Admitting: Psychiatry

## 2018-06-04 ENCOUNTER — Ambulatory Visit (INDEPENDENT_AMBULATORY_CARE_PROVIDER_SITE_OTHER): Payer: Medicare Other | Admitting: Psychiatry

## 2018-06-04 VITALS — BP 116/79 | HR 96 | Ht 69.0 in | Wt 154.0 lb

## 2018-06-04 DIAGNOSIS — F321 Major depressive disorder, single episode, moderate: Secondary | ICD-10-CM | POA: Diagnosis not present

## 2018-06-04 MED ORDER — ALPRAZOLAM 0.5 MG PO TABS: 0.5000 mg | ORAL_TABLET | Freq: Two times a day (BID) | ORAL | 0 refills | Status: DC | PRN

## 2018-06-04 MED ORDER — SERTRALINE HCL 50 MG PO TABS: ORAL_TABLET | ORAL | 0 refills | Status: DC

## 2018-06-04 NOTE — Patient Instructions (Signed)
1. Decrease lexapro 5 mg daily for one week, then disocontinue 2. Start sertraline 25 mg daily for one week, then 50 mg daily for one week, then 100 mg daily  3. Discontinue mirtazapine  4. Continue Xanax 0.5 mg twice a day as needed for anxiety  5. Return to clinic in one month for 30 mins

## 2018-07-09 ENCOUNTER — Other Ambulatory Visit (HOSPITAL_COMMUNITY): Payer: Self-pay | Admitting: Psychiatry

## 2018-07-09 MED ORDER — SERTRALINE HCL 100 MG PO TABS: 100.0000 mg | ORAL_TABLET | Freq: Every day | ORAL | 0 refills | Status: DC

## 2018-07-10 ENCOUNTER — Other Ambulatory Visit (HOSPITAL_COMMUNITY): Payer: Self-pay | Admitting: Psychiatry

## 2018-07-10 MED ORDER — ALPRAZOLAM 0.5 MG PO TABS: 0.5000 mg | ORAL_TABLET | Freq: Two times a day (BID) | ORAL | 0 refills | Status: DC | PRN

## 2018-07-11 NOTE — Progress Notes (Signed)
BH MD/PA/NP OP Progress Note  07/16/2018 1:55 PM Garrett Klein  MRN:  [id number]  Chief Complaint:  Chief Complaint    Follow-up; Depression     HPI:  Patient presents for follow-up appointment for depression.  He states that he has been feeling better after starting Zoloft.  He feels like he is "more person." He feels good that he gained his weight as he used to weigh down from 170 lbs to 140 lbs. He has started to meet with his friends and family. He goes to church and reports his spirituality is very important. He feels guilty that he needed to sent his wife to hospice after she suffered from stroke, although he promised her that he would not. He understands that he did what he could do best for his wife. He sleeps better.  He has good appetite.  He has fair concentration.  He has more motivation.  He denies SI.  He feels anxious, tense and has occasional panic attacks with crying spells when he thinks about his wife.  He takes Xanax twice a day.  Although he understands that he would not want to continue taking Xanax, he feels he would need it so that he would not get too much overwhelmed.    Wt Readings from Last 3 Encounters:  07/16/18 170 lb (77.1 kg)  06/04/18 154 lb (69.9 kg)  05/05/18 153 lb (69.4 kg)   Per PMP,  Xanax filled on 07/10/2018    Visit Diagnosis:    ICD-10-CM   1. Current moderate episode of major depressive disorder without prior episode ([place]) F32.1     Past Psychiatric History: Please see initial evaluation for full details. I have reviewed the history. No updates at this time.     Past Medical History:  Past Medical History:  Diagnosis Date  . Chronic lower back pain   . Collagenous colitis [address] [place]  . COPD (chronic obstructive pulmonary disease) ([place])   . Essential hypertension   . Hyperlipidemia   . MRSA infection   . PUD (peptic ulcer disease)     Past Surgical History:  Procedure Laterality Date  . BACK SURGERY    . BIOPSY  06/03/2017    Procedure: BIOPSY;  Surgeon: Danie Binder, MD;  [facility];  Service: Endoscopy;;  colon  . COLONOSCOPY  2001   Dr. Sharlett Iles, normal  . COLONOSCOPY  04/2013   [facility]: Fentanyl 200mg Danford Bad 10mg : sigmoid diverticulosis, normal terminal ileum, small internal hemorrhoids, random colon bx: collagenous colitis. next tcs 04/2018  . COLONOSCOPY  2011   [facility]: two polyps, sessile serrated adenoma, mild diverticulosis  . COLONOSCOPY WITH PROPOFOL N/A 06/03/2017   Procedure: COLONOSCOPY WITH PROPOFOL;  Surgeon: Danie Binder, MD;  [facility];  Service: Endoscopy;  Laterality: N/A;  10:00am  . ELBOW BURSA SURGERY    . ESOPHAGEAL DILATION N/A 05/19/2015   Procedure: ESOPHAGEAL DILATION;  Surgeon: Danie Binder, MD;  [facility];  Service: Endoscopy;  Laterality: N/A;  . ESOPHAGOGASTRODUODENOSCOPY N/A 10/28/2014   SLF: 1. Esophagitis due to GERD. KOH neg. 2. Small hiatal hernia 3. Moderate erosive gastritis 4. RUQ pain due to large ulcer 5. [place] duodentis in the bulb.   . ESOPHAGOGASTRODUODENOSCOPY N/A 05/19/2015   SLF: 1. Mild distal esophagitis 2. Peptic stricture at the gastroesophageal junction 3. Mild non-erosive gastritis 4. Pseudo pylorus due to prior PUD.   Marland Kitchen ESOPHAGOGASTRODUODENOSCOPY N/A 03/07/2017   Procedure: ESOPHAGOGASTRODUODENOSCOPY (EGD);  Surgeon: Danie Binder, MD;  [facility];  Service: Endoscopy;  Laterality: N/A;  200  . FINGER AMPUTATION Left 1985   index finger  . JOINT REPLACEMENT     left hip surgery - January 26, 2014  . POLYPECTOMY  06/03/2017   Procedure: POLYPECTOMY;  Surgeon: Danie Binder, MD;  [facility];  Service: Endoscopy;;  colon  . ROTATOR CUFF REPAIR     November 2014  . SAVORY DILATION N/A 03/07/2017   Procedure: SAVORY DILATION;  Surgeon: Danie Binder, MD;  [facility];  Service: Endoscopy;  Laterality: N/A;    Family Psychiatric History: Please see initial evaluation for full  details. I have reviewed the history. No updates at this time.     Family History:  Family History  Problem Relation Age of Onset  . Cancer Mother   . Diabetes type II Father   . Heart attack Father   . Arthritis Sister   . Stroke Brother   . Coronary artery disease Brother   . Diabetes Other   . Heart attack Unknown   . Colon cancer Neg Hx   . Liver disease Neg Hx   . Ulcers Neg Hx     Social History:  Social History   Socioeconomic History  . Marital status: Widowed    Spouse name: Not on file  . Number of children: Not on file  . Years of education: Not on file  . Highest education level: Not on file  Occupational History  . Not on file  Social Needs  . Financial resource strain: Not on file  . Food insecurity:    Worry: Not on file    Inability: Not on file  . Transportation needs:    Medical: Not on file    Non-medical: Not on file  Tobacco Use  . Smoking status: Current Every Day Smoker    Packs/day: 0.50    Years: 40.00    Pack years: 20.00    Types: Cigarettes    Start date: 01/09/1969  . Smokeless tobacco: Never Used  Substance and Sexual Activity  . Alcohol use: No    Alcohol/week: 0.0 standard drinks  . Drug use: No  . Sexual activity: Not on file  Lifestyle  . Physical activity:    Days per week: Not on file    Minutes per session: Not on file  . Stress: Not on file  Relationships  . Social connections:    Talks on phone: Not on file    Gets together: Not on file    Attends religious service: Not on file    Active member of club or organization: Not on file    Attends meetings of clubs or organizations: Not on file    Relationship status: Not on file  Other Topics Concern  . Not on file  Social History Narrative  . Not on file    Allergies:  Allergies  Allergen Reactions  . Bee Venom Swelling  . Ciprofloxacin Other (See Comments)    Turns red from the chest up.  Marland Kitchen Hydrocodone     Rash   . Levaquin [Levofloxacin In D5w]      CANDIDA ALL OVER MOUTH TO PENIS  . Penicillins Other (See Comments)    Reaction in childhood, caused patient to pass out Has patient had a PCN reaction causing immediate rash, facial/tongue/throat swelling, SOB or lightheadedness with hypotension:Yes Has patient had a PCN reaction causing severe rash involving mucus  membranes or skin necrosis: No Has patient had a PCN reaction that required hospitalization: No Has patient had a PCN reaction occurring within the last 10 years: No If all of the above answers are "NO", then may proceed with Cephalosporin use.   . Tramadol     Hives   . Sulfa Antibiotics Rash    Metabolic Disorder Labs: No results found for: HGBA1C, MPG No results found for: PROLACTIN No results found for: CHOL, TRIG, HDL, CHOLHDL, VLDL, LDLCALC Lab Results  Component Value Date   TSH 0.829 08/18/2015   TSH 1.440 05/19/2015    Therapeutic Level Labs: No results found for: LITHIUM No results found for: VALPROATE No components found for:  CBMZ  Current Medications: Current Outpatient Medications  Medication Sig Dispense Refill  . albuterol (PROVENTIL HFA;VENTOLIN HFA) 108 (90 BASE) MCG/ACT inhaler Inhale 2 puffs into the lungs every 6 (six) hours as needed for wheezing or shortness of breath.    Marland Kitchen albuterol (PROVENTIL) (2.5 MG/3ML) 0.083% nebulizer solution Take 2.5 mg by nebulization every 6 (six) hours as needed for wheezing or shortness of breath.    Derrill Memo ON 08/10/2018] ALPRAZolam (XANAX) 0.5 MG tablet Take 1 tablet (0.5 mg total) by mouth 2 (two) times daily as needed for anxiety or sleep. 60 tablet 1  . atorvastatin (LIPITOR) 80 MG tablet Take 40 mg by mouth every evening.     . budesonide (ENTOCORT EC) 3 MG 24 hr capsule 2 PO DAILY (Patient taking differently: Take 6 mg by mouth daily. ) 60 capsule 5  . budesonide-formoterol (SYMBICORT) 160-4.5 MCG/ACT inhaler Inhale 2 puffs into the lungs 2 (two) times daily.    . cholecalciferol (VITAMIN D) 1000 UNITS  tablet Take 4,000 Units by mouth daily.     Marland Kitchen EPINEPHrine 0.3 mg/0.3 mL IJ SOAJ injection Inject 0.3 mg into the muscle as needed (allergic reaction).    . gabapentin (NEURONTIN) 400 MG capsule Take 400 mg by mouth 4 (four) times daily.    Marland Kitchen lisinopril-hydrochlorothiazide (PRINZIDE,ZESTORETIC) 10-12.5 MG tablet Take 1 tablet by mouth daily.    Marland Kitchen omeprazole (PRILOSEC) 20 MG capsule 1 PO 30 MINS BEFORE YOUR FIRST MEAL EVERY DAY FOREVER (Patient taking differently: Take 20 mg by mouth daily as needed. 30 minutes before breakfast) 30 capsule 11  . sertraline (ZOLOFT) 100 MG tablet Take 1 tablet (100 mg total) by mouth daily. 90 tablet 0  . sildenafil (VIAGRA) 100 MG tablet Take 100 mg by mouth daily as needed for erectile dysfunction.    Marland Kitchen umeclidinium bromide (INCRUSE ELLIPTA) 62.5 MCG/INH AEPB Inhale 1 puff into the lungs every morning.      No current facility-administered medications for this visit.      Musculoskeletal: Strength & Muscle Tone: within normal limits Gait & Station: normal Patient leans: N/A  Psychiatric Specialty Exam: Review of Systems  Psychiatric/Behavioral: Positive for depression. Negative for hallucinations, memory loss, substance abuse and suicidal ideas. The patient is nervous/anxious and has insomnia.   All other systems reviewed and are negative.   Blood pressure 129/78, pulse 89, height 5\' 9"  (1.753 m), weight 170 lb (77.1 kg), SpO2 98 %.Body mass index is 25.1 kg/m.  General Appearance: Fairly Groomed  Eye Contact:  Good  Speech:  Clear and Coherent  Volume:  Normal  Mood:  "better"  Affect:  Appropriate, Congruent, Tearful and more reactive  Thought Process:  Coherent  Orientation:  Full (Time, Place, and Person)  Thought Content: Logical   Suicidal Thoughts:  No  Homicidal Thoughts:  No  Memory:  Immediate;   Good  Judgement:  Good  Insight:  Good  Psychomotor Activity:  Normal  Concentration:  Concentration: Good and Attention Span: Good   Recall:  Good  Fund of Knowledge: Good  Language: Good  Akathisia:  No  Handed:  Right  AIMS (if indicated): not done  Assets:  Communication Skills Desire for Improvement  ADL's:  Intact  Cognition: WNL  Sleep:  Good   Screenings:   Assessment and Plan:  Garrett Klein is a 62 y.o. year old male with a history of depression,  hypertension, history of hyperlipidemia,collagenous colitis, who presents for follow up appointment for Current moderate episode of major depressive disorder without prior episode ([place])  # MDD, single episode without psychotic features There has been significant improvement in neurovegetative symptoms since switching from Lexapro to sertraline.  Psychosocial stressors including grief of loss of his wife from pancreatic cancer.  Will continue current dose of sertraline to target depression and to see if it exerts full effect.  Will continue Xanax as needed for anxiety at this time; he agrees to taper it off in the future.  Discussed risk of dependence and oversedation (Patient did have accidental overdose in 09/2017 with xanax and opioid). Validated his grief.  Discussed behavioral activation.   Plan I have reviewed and updated plans as below 1. Continue sertraline 100 mg daily  2. Continue Xanax 0.5 mg twice a day as needed for anxiety  5. Return to clinic in two months for 30 mins - Will check TSH at the next encounter  Past trials of medication:lexapro, mirtazapine (drowsiness), venlafaxine, xanax,    The patient demonstrates the following risk factors for suicide: Chronic risk factors for suicide include:psychiatric disorder ofdepression. Acute risk factorsfor suicide include: loss (financial, interpersonal, professional). Protective factorsfor this patient include: positive social support, coping skills and hope for the future. Considering these factors, the overall suicide risk at this point appears to below. Patientisappropriate for outpatient  follow up.  The duration of this appointment visit was 30 minutes of face-to-face time with the patient.  Greater than 50% of this time was spent in counseling, explanation of  diagnosis, planning of further management, and coordination of care.  [name], MD 07/16/2018, 1:55 PM

## 2018-07-13 ENCOUNTER — Ambulatory Visit (HOSPITAL_COMMUNITY): Payer: Medicare Other | Admitting: Licensed Clinical Social Worker

## 2018-07-16 ENCOUNTER — Encounter (HOSPITAL_COMMUNITY): Payer: Self-pay | Admitting: Psychiatry

## 2018-07-16 ENCOUNTER — Ambulatory Visit (INDEPENDENT_AMBULATORY_CARE_PROVIDER_SITE_OTHER): Payer: Medicare Other | Admitting: Psychiatry

## 2018-07-16 VITALS — BP 129/78 | HR 89 | Ht 69.0 in | Wt 170.0 lb

## 2018-07-16 DIAGNOSIS — F321 Major depressive disorder, single episode, moderate: Secondary | ICD-10-CM

## 2018-07-16 MED ORDER — SERTRALINE HCL 100 MG PO TABS: 100.0000 mg | ORAL_TABLET | Freq: Every day | ORAL | 0 refills | Status: DC

## 2018-07-16 MED ORDER — ALPRAZOLAM 0.5 MG PO TABS: 0.5000 mg | ORAL_TABLET | Freq: Two times a day (BID) | ORAL | 1 refills | Status: DC | PRN

## 2018-07-16 NOTE — Patient Instructions (Signed)
1. Continue sertraline 100 mg daily  2. Continue Xanax 0.5 mg twice a day as needed for anxiety  5. Return to clinic in two months for 30 mins

## 2018-08-11 ENCOUNTER — Other Ambulatory Visit: Payer: Self-pay | Admitting: Urology

## 2018-08-11 DIAGNOSIS — N430 Encysted hydrocele: Secondary | ICD-10-CM

## 2018-08-13 ENCOUNTER — Other Ambulatory Visit (HOSPITAL_COMMUNITY)
Admission: RE | Admit: 2018-08-13 | Discharge: 2018-08-13 | Disposition: A | Discharge status: Home / Self Care | Payer: Medicare Other | Source: Ambulatory Visit | Attending: Gastroenterology | Admitting: Gastroenterology

## 2018-08-13 ENCOUNTER — Ambulatory Visit (HOSPITAL_COMMUNITY)
Admission: RE | Admit: 2018-08-13 | Discharge: 2018-08-13 | Disposition: A | Discharge status: Home / Self Care | Payer: Medicare Other | Source: Ambulatory Visit | Attending: Urology | Admitting: Urology

## 2018-08-13 ENCOUNTER — Ambulatory Visit (INDEPENDENT_AMBULATORY_CARE_PROVIDER_SITE_OTHER): Payer: Medicare Other | Admitting: Gastroenterology

## 2018-08-13 ENCOUNTER — Encounter: Payer: Self-pay | Admitting: Gastroenterology

## 2018-08-13 VITALS — BP 148/87 | HR 84 | Temp 98.2°F | Ht 70.0 in | Wt 168.4 lb

## 2018-08-13 DIAGNOSIS — N430 Encysted hydrocele: Secondary | ICD-10-CM | POA: Diagnosis present

## 2018-08-13 DIAGNOSIS — N433 Hydrocele, unspecified: Secondary | ICD-10-CM | POA: Diagnosis not present

## 2018-08-13 DIAGNOSIS — N5089 Other specified disorders of the male genital organs: Secondary | ICD-10-CM | POA: Insufficient documentation

## 2018-08-13 DIAGNOSIS — R1310 Dysphagia, unspecified: Secondary | ICD-10-CM

## 2018-08-13 DIAGNOSIS — K921 Melena: Secondary | ICD-10-CM | POA: Diagnosis not present

## 2018-08-13 DIAGNOSIS — R195 Other fecal abnormalities: Secondary | ICD-10-CM | POA: Diagnosis not present

## 2018-08-13 DIAGNOSIS — K529 Noninfective gastroenteritis and colitis, unspecified: Secondary | ICD-10-CM | POA: Insufficient documentation

## 2018-08-13 DIAGNOSIS — R1013 Epigastric pain: Secondary | ICD-10-CM | POA: Diagnosis not present

## 2018-08-13 LAB — CBC WITH DIFFERENTIAL/PLATELET
Basophils Absolute: 0 10*3/uL (ref 0.0–0.1)
Basophils Relative: 0 %
EOS PCT: 1 %
Eosinophils Absolute: 0.1 10*3/uL (ref 0.0–0.7)
HEMATOCRIT: 42.4 % (ref 39.0–52.0)
Hemoglobin: 14.6 g/dL (ref 13.0–17.0)
LYMPHS PCT: 10 %
Lymphs Abs: 1.2 10*3/uL (ref 0.7–4.0)
MCH: 32.4 pg (ref 26.0–34.0)
MCHC: 34.4 g/dL (ref 30.0–36.0)
MCV: 94 fL (ref 78.0–100.0)
MONOS PCT: 7 %
Monocytes Absolute: 0.9 10*3/uL (ref 0.1–1.0)
NEUTROS ABS: 9.6 10*3/uL — AB (ref 1.7–7.7)
Neutrophils Relative %: 82 %
Platelets: 310 10*3/uL (ref 150–400)
RBC: 4.51 MIL/uL (ref 4.22–5.81)
RDW: 14.3 % (ref 11.5–15.5)
WBC: 11.9 10*3/uL — ABNORMAL HIGH (ref 4.0–10.5)

## 2018-08-13 LAB — COMPREHENSIVE METABOLIC PANEL
ALBUMIN: 3.9 g/dL (ref 3.5–5.0)
ALT: 20 U/L (ref 0–44)
AST: 21 U/L (ref 15–41)
Alkaline Phosphatase: 72 U/L (ref 38–126)
Anion gap: 9 (ref 5–15)
BILIRUBIN TOTAL: 0.6 mg/dL (ref 0.3–1.2)
BUN: 12 mg/dL (ref 8–23)
CHLORIDE: 104 mmol/L (ref 98–111)
CO2: 26 mmol/L (ref 22–32)
CREATININE: 0.69 mg/dL (ref 0.61–1.24)
Calcium: 8.8 mg/dL — ABNORMAL LOW (ref 8.9–10.3)
GFR calc Af Amer: >60 mL/min (ref >60)
GFR calc non Af Amer: >60 mL/min (ref >60)
GLUCOSE: 106 mg/dL — AB (ref 70–99)
POTASSIUM: 3.9 mmol/L (ref 3.5–5.1)
Sodium: 139 mmol/L (ref 135–145)
Total Protein: 6.5 g/dL (ref 6.5–8.1)

## 2018-08-13 LAB — LIPASE, BLOOD: Lipase: 34 U/L (ref 11–51)

## 2018-08-13 NOTE — Patient Instructions (Signed)
1. Please have labs done today. We will call with results and next step for work up.

## 2018-08-13 NOTE — Progress Notes (Addendum)
REVIEWED. Entocort should have been stopped AUG 2019. PT SHOULD STOP ENTOCORT TODAY. DIARRHEA LIKELY DUE TO STRESS/ANXIETY. USE IMODIUM TO CONTROL DIARRHEA. FOLLOW UP IN NOV 21 @1130  WITH DR. FIELDS, Dx: DIARRHEA. CONSIDER REPEAT TCS IF DIARRHEA PERSISTS. IF MICROSCOPIC COLITIS PRESENT, WILL RE-START ENTOCORT AN DPT WILL NEED TO TAPER OFF PPI AND ZOLOFT.  Primary Care Physician: [name], MD  Primary Gastroenterologist:  Barney Drain, MD   Chief Complaint  Patient presents with  . Blood In Stools    HPI: Garrett Klein is a 62 y.o. male here at the request of PCP for heme positive stool.  Patient states he had Hemoccults performed his annual physical.  He took 2 separate tests which were both positive.  Patient had a colonoscopy back in July 2018 for history of colon polyps.  He had several hyperplastic polyps removed, diverticulosis, random colon biopsies for chronic diarrhea and history of collagenous colitis but these were negative for microscopic colitis, internal/external hemorrhoids.  His last EGD was in April 2018, he had a esophageal stricture and gastritis.  February 2016 at time of barium esophagram he had laryngeal penetration and aspiration of contrast with no spontaneous cough reflex.  Modified barium swallow study the following week showed mild oropharyngeal phase dysphagia characterized by premature spillage of liquids, decreased tongue base retraction, reduced hypo-laryngeal excursion, and decreased laryngeal vestibule closure resulting in premature spillage of liquids, penetration of thins and nectar as before and during swallowing and trace/minimal amounts, vallecular residue with liquids after the swallow.  Patient was not sensate to penetration but was able to clear cues.  Penetration occurred in greater amounts and with one episode of actual aspiration with straw sips thin.  Patient's epiglottis noted to fully invert and remained inverted against posterior pharyngeal  wall for up to 8 seconds at times when swallowing thin and nectar thick liquids. Recommend self regulated regular textures and thin liquids via small sips with periodic cough/throat clear; no straws.   Since patient was last seen, his wife passed away.  He has multiple complaints today.  Notifies me of his ED visit back in April with diarrhea and blood in the stool along with left lower quadrant pain.  He was treated for possible diverticulitis versus colitis.  Symptoms seem to get better at that point.  He continues to take Entocort 1 g daily for chronic diarrhea stating he has not been able to get off of it due to recurrent diarrhea.  He also tells me he is never really tried Imodium.  Microscopic colitis colonoscopy last year.  He has 3-4 loose stools daily.  A few days ago he began having epigastric pain and states his stools turned black.  Denies Pepto use.  This morning he started to be brown again.  Continues to have difficulty swallowing.  Not so much having choking or coughing spells but notes that several minutes or even more than an hour after food might regurgitate back up.  Denies vomiting.  Heartburn okay.   Current Outpatient Medications  Medication Sig Dispense Refill  . albuterol (PROVENTIL HFA;VENTOLIN HFA) 108 (90 BASE) MCG/ACT inhaler Inhale 2 puffs into the lungs every 6 (six) hours as needed for wheezing or shortness of breath.    Marland Kitchen albuterol (PROVENTIL) (2.5 MG/3ML) 0.083% nebulizer solution Take 2.5 mg by nebulization every 6 (six) hours as needed for wheezing or shortness of breath.    . ALPRAZolam (XANAX) 0.5 MG tablet Take 1 tablet (0.5 mg total) by mouth 2 (  two) times daily as needed for anxiety or sleep. 60 tablet 1  . atorvastatin (LIPITOR) 80 MG tablet Take 40 mg by mouth every evening.     . budesonide (ENTOCORT EC) 3 MG 24 hr capsule 2 PO DAILY (Patient taking differently: Take 6 mg by mouth daily. ) 60 capsule 5  . budesonide-formoterol (SYMBICORT) 160-4.5 MCG/ACT  inhaler Inhale 2 puffs into the lungs 2 (two) times daily.    . cholecalciferol (VITAMIN D) 1000 UNITS tablet Take 4,000 Units by mouth daily.     Marland Kitchen EPINEPHrine 0.3 mg/0.3 mL IJ SOAJ injection Inject 0.3 mg into the muscle as needed (allergic reaction).    . gabapentin (NEURONTIN) 400 MG capsule Take 400 mg by mouth 4 (four) times daily.    Marland Kitchen lisinopril-hydrochlorothiazide (PRINZIDE,ZESTORETIC) 10-12.5 MG tablet Take 1 tablet by mouth daily.    Marland Kitchen omeprazole (PRILOSEC) 20 MG capsule 1 PO 30 MINS BEFORE YOUR FIRST MEAL EVERY DAY FOREVER (Patient taking differently: Take 20 mg by mouth daily as needed. 30 minutes before breakfast) 30 capsule 11  . sertraline (ZOLOFT) 100 MG tablet Take 1 tablet (100 mg total) by mouth daily. 90 tablet 0  . sildenafil (VIAGRA) 100 MG tablet Take 100 mg by mouth daily as needed for erectile dysfunction.    Marland Kitchen umeclidinium bromide (INCRUSE ELLIPTA) 62.5 MCG/INH AEPB Inhale 1 puff into the lungs every morning.      No current facility-administered medications for this visit.     Allergies as of 08/13/2018 - Review Complete 08/13/2018  Allergen Reaction Noted  . Bee venom Swelling 06/21/2012  . Ciprofloxacin Other (See Comments) 08/06/2016  . Hydrocodone  03/28/2013  . Levaquin [levofloxacin in d5w]  12/11/2016  . Penicillins Other (See Comments) 12/19/2010  . Tramadol  10/18/2014  . Sulfa antibiotics Rash 03/28/2013   Past Medical History:  Diagnosis Date  . Chronic lower back pain   . Collagenous colitis [address] [place]  . COPD (chronic obstructive pulmonary disease) ([place])   . Essential hypertension   . Hyperlipidemia   . MRSA infection   . PUD (peptic ulcer disease)    Past Surgical History:  Procedure Laterality Date  . BACK SURGERY    . BIOPSY  06/03/2017   Procedure: BIOPSY;  Surgeon: Danie Binder, MD;  [facility];  Service: Endoscopy;;  colon  . COLONOSCOPY  2001   Dr. Sharlett Iles, normal  . COLONOSCOPY  04/2013   [facility]:  Fentanyl 200mg Danford Bad 10mg : sigmoid diverticulosis, normal terminal ileum, small internal hemorrhoids, random colon bx: collagenous colitis. next tcs 04/2018  . COLONOSCOPY  2011   [facility]: two polyps, sessile serrated adenoma, mild diverticulosis  . COLONOSCOPY WITH PROPOFOL N/A 06/03/2017   Dr. Oneida Alar: Diverticulosis, three 2 to 3 mm polyps removed from the mid transverse colon and cecum, three 4 to 6 mm polyps in the rectum and mid descending colon removed.  External/internal hemorrhoids.  hyperplastic polyps.  Random colon biopsies negative for microscopic colitis. next colonoscopy in 5 years.  . ELBOW BURSA SURGERY    . ESOPHAGEAL DILATION N/A 05/19/2015   Procedure: ESOPHAGEAL DILATION;  Surgeon: Danie Binder, MD;  [facility];  Service: Endoscopy;  Laterality: N/A;  . ESOPHAGOGASTRODUODENOSCOPY N/A 10/28/2014   SLF: 1. Esophagitis due to GERD. KOH neg. 2. Small hiatal hernia 3. Moderate erosive gastritis 4. RUQ pain due to large ulcer 5. [place] duodentis in the bulb.   . ESOPHAGOGASTRODUODENOSCOPY N/A 05/19/2015   SLF: 1. Mild  distal esophagitis 2. Peptic stricture at the gastroesophageal junction 3. Mild non-erosive gastritis 4. Pseudo pylorus due to prior PUD.   Marland Kitchen ESOPHAGOGASTRODUODENOSCOPY N/A 03/07/2017   Dr. Oneida Alar: Benign-appearing esophageal stenosis status post dilation, mild gastritis  . FINGER AMPUTATION Left 1985   index finger  . JOINT REPLACEMENT     left hip surgery - January 26, 2014  . POLYPECTOMY  06/03/2017   Procedure: POLYPECTOMY;  Surgeon: Danie Binder, MD;  [facility];  Service: Endoscopy;;  colon  . ROTATOR CUFF REPAIR     November 2014  . SAVORY DILATION N/A 03/07/2017   Procedure: SAVORY DILATION;  Surgeon: Danie Binder, MD;  [facility];  Service: Endoscopy;  Laterality: N/A;    ROS:  General: Negative for anorexia, weight loss, fever, chills, fatigue, weakness. ENT: Negative for hoarseness, difficulty swallowing , nasal  congestion. CV: Negative for chest pain, angina, palpitations, dyspnea on exertion, peripheral edema.  Respiratory: Negative for dyspnea at rest, dyspnea on exertion, cough, sputum, wheezing.  GI: See history of present illness. GU:  Negative for dysuria, hematuria, urinary incontinence, urinary frequency, nocturnal urination.  Testicular swelling, ultrasound planned today.  History of hydrocele. Endo: Negative for unusual weight change.    Physical Examination:   BP (!) 148/87   Pulse 84   Temp 98.2 F (36.8 C) (Oral)   Ht 5\' 10"  (1.778 m)   Wt 168 lb 6.4 oz (76.4 kg)   BMI 24.16 kg/m   General: Well-nourished, well-developed in no acute distress.  Eyes: No icterus. Mouth: Oropharyngeal mucosa moist and pink , no lesions erythema or exudate. Lungs: Clear to auscultation bilaterally.  Heart: Regular rate and rhythm, no murmurs rubs or gallops.  Abdomen: Bowel sounds are normal, nontender, nondistended, no hepatosplenomegaly or masses, no abdominal bruits or hernia , no rebound or guarding.   Extremities: No lower extremity edema. No clubbing or deformities. Neuro: Alert and oriented x 4   Skin: Warm and dry, no jaundice.   Psych: Alert and cooperative, normal mood and affect.  Labs:  Lab Results  Component Value Date   WBC 20.0 (H) 03/02/2018   HGB 16.1 03/02/2018   HCT 48.7 03/02/2018   MCV 98.2 03/02/2018   PLT 335 03/02/2018   Lab Results  Component Value Date   CREATININE 1.57 (H) 03/02/2018   BUN 29 (H) 03/02/2018   NA 133 (L) 03/02/2018   K 4.5 03/02/2018   CL 97 (L) 03/02/2018   CO2 23 03/02/2018   Lab Results  Component Value Date   ALT 16 (L) 03/02/2018   AST 16 03/02/2018   ALKPHOS 98 03/02/2018   BILITOT 0.6 03/02/2018     Imaging Studies: No results found.

## 2018-08-13 NOTE — Assessment & Plan Note (Signed)
62 year old gentleman with history of chronic diarrhea, previously diagnosed with collagenous colitis and has required chronic Entocort (although last colonoscopy with negative colon biopsies 2018), chronic dysphagia with history of stricture as well as abnormal epiglottis findings on modified barium swallow study, who presents after having routine stool Hemoccults performed which were positive.  Patient had an EGD and colonoscopy last year.  Most recent CBC with normal hemoglobin.  Etiology of heme positive stool may be secondary to benign findings such as hemorrhoids, anorectal irritation with chronic diarrhea or even upper GI tract.  As far as swallowing concerns, patient for the most part is been fairly stable.  Chronic diarrhea managed with Entocort although he was unable to taper off as recommended.  May consider switching to antidiarrheal see how he does.  For the past 3 days has had postprandial epigastric pain, stools are guaiac although starting to turn brown again.  Denies Pepto.  No weakness, dizziness, increased shortness of breath.  Initially will check labs including CBC, CMET, lipase.  Determine next step in work-up based on findings.

## 2018-08-14 ENCOUNTER — Ambulatory Visit (INDEPENDENT_AMBULATORY_CARE_PROVIDER_SITE_OTHER): Payer: Medicare Other | Admitting: Urology

## 2018-08-14 ENCOUNTER — Other Ambulatory Visit (HOSPITAL_COMMUNITY)
Admission: AD | Admit: 2018-08-14 | Discharge: 2018-08-14 | Disposition: A | Discharge status: Home / Self Care | Payer: Medicare Other | Source: Other Acute Inpatient Hospital | Attending: Urology | Admitting: Urology

## 2018-08-14 DIAGNOSIS — N50811 Right testicular pain: Secondary | ICD-10-CM

## 2018-08-14 DIAGNOSIS — N500 Atrophy of testis: Secondary | ICD-10-CM

## 2018-08-14 DIAGNOSIS — N430 Encysted hydrocele: Secondary | ICD-10-CM | POA: Diagnosis not present

## 2018-08-14 LAB — URINALYSIS, COMPLETE (UACMP) WITH MICROSCOPIC
BACTERIA UA: NONE SEEN
BILIRUBIN URINE: NEGATIVE
Glucose, UA: NEGATIVE mg/dL
Hgb urine dipstick: NEGATIVE
Ketones, ur: NEGATIVE mg/dL
Leukocytes, UA: NEGATIVE
Nitrite: NEGATIVE
Protein, ur: NEGATIVE mg/dL
SPECIFIC GRAVITY, URINE: 1.008 (ref 1.005–1.030)
pH: 6 (ref 5.0–8.0)

## 2018-08-14 NOTE — Progress Notes (Signed)
CC'D TO PCP °

## 2018-08-17 NOTE — Progress Notes (Signed)
PT is aware.

## 2018-08-19 ENCOUNTER — Other Ambulatory Visit: Payer: Self-pay | Admitting: Urology

## 2018-08-26 ENCOUNTER — Encounter: Payer: Self-pay | Admitting: Gastroenterology

## 2018-08-26 NOTE — Progress Notes (Signed)
PATIENT SCHEDULED AND LETTER SENT  °

## 2018-08-31 NOTE — Progress Notes (Signed)
SLF recommendations noted. See result note.

## 2018-09-02 NOTE — Progress Notes (Signed)
Pt is aware of instructions and plan.

## 2018-09-10 NOTE — Progress Notes (Signed)
BH MD/PA/NP OP Progress Note  09/15/2018 2:29 PM Garrett Klein  MRN:  [id number]  Chief Complaint:  Chief Complaint    Depression; Follow-up     HPI:  Patient presents for follow-up appointment for depression.  He states that he has been doing well.  However, on further elaboration, he feels it has been still difficult to think about his wife now that holiday is approaching.  He states that his wife used to love the holiday.  He finds grief therapy to be very helpful.  He have started to have a cat, who used to live with a person, who deceased in the apartment.  He wants to have his cat as emotional pet.  He also feels a little anxious about having cataract surgery tomorrow.  He has middle insomnia.  He feels more motivated, although he tends to stay in the house.  He has fair concentration.  He denies SI.  He feels anxious and tense at times.  He wakes up in the morning with panic attacks.  He has good appetite.   Per PMP,  Xanax filled on 09/04/2018    Wt Readings from Last 3 Encounters:  09/15/18 166 lb (75.3 kg)  08/13/18 168 lb 6.4 oz (76.4 kg)  07/16/18 170 lb (77.1 kg)   Visit Diagnosis:    ICD-10-CM   1. Current moderate episode of major depressive disorder without prior episode ([place]) F32.1 TSH    Past Psychiatric History: Please see initial evaluation for full details. I have reviewed the history. No updates at this time.     Past Medical History:  Past Medical History:  Diagnosis Date  . Chronic lower back pain   . Collagenous colitis [address] [place]  . COPD (chronic obstructive pulmonary disease) ([place])   . Essential hypertension   . Hyperlipidemia   . MRSA infection   . PUD (peptic ulcer disease)     Past Surgical History:  Procedure Laterality Date  . BACK SURGERY    . BIOPSY  06/03/2017   Procedure: BIOPSY;  Surgeon: Danie Binder, MD;  [facility];  Service: Endoscopy;;  colon  . COLONOSCOPY  2001   Dr. Sharlett Iles, normal  . COLONOSCOPY   04/2013   [facility]: Fentanyl 200mg Danford Bad 10mg : sigmoid diverticulosis, normal terminal ileum, small internal hemorrhoids, random colon bx: collagenous colitis. next tcs 04/2018  . COLONOSCOPY  2011   [facility]: two polyps, sessile serrated adenoma, mild diverticulosis  . COLONOSCOPY WITH PROPOFOL N/A 06/03/2017   Dr. Oneida Alar: Diverticulosis, three 2 to 3 mm polyps removed from the mid transverse colon and cecum, three 4 to 6 mm polyps in the rectum and mid descending colon removed.  External/internal hemorrhoids.  hyperplastic polyps.  Random colon biopsies negative for microscopic colitis. next colonoscopy in 5 years.  . ELBOW BURSA SURGERY    . ESOPHAGEAL DILATION N/A 05/19/2015   Procedure: ESOPHAGEAL DILATION;  Surgeon: Danie Binder, MD;  [facility];  Service: Endoscopy;  Laterality: N/A;  . ESOPHAGOGASTRODUODENOSCOPY N/A 10/28/2014   [facility]: 1. Esophagitis due to GERD. KOH neg. 2. Small hiatal hernia 3. Moderate erosive gastritis 4. RUQ pain due to large ulcer 5. Ramsey duodentis in the bulb.   . ESOPHAGOGASTRODUODENOSCOPY N/A 05/19/2015   [facility]: 1. Mild distal esophagitis 2. Peptic stricture at the gastroesophageal junction 3. Mild non-erosive gastritis 4. Pseudo pylorus due to prior PUD.   Marland Kitchen ESOPHAGOGASTRODUODENOSCOPY N/A 03/07/2017   Dr. Oneida Alar: Benign-appearing esophageal stenosis status post  dilation, mild gastritis  . FINGER AMPUTATION Left 1985   index finger  . JOINT REPLACEMENT     left hip surgery - January 26, 2014  . POLYPECTOMY  06/03/2017   Procedure: POLYPECTOMY;  Surgeon: Danie Binder, MD;  [facility];  Service: Endoscopy;;  colon  . ROTATOR CUFF REPAIR     November 2014  . SAVORY DILATION N/A 03/07/2017   Procedure: SAVORY DILATION;  Surgeon: Danie Binder, MD;  [facility];  Service: Endoscopy;  Laterality: N/A;    Family Psychiatric History: Please see initial evaluation for full details. I have reviewed the history. No updates at this time.      Family History:  Family History  Problem Relation Age of Onset  . Cancer Mother        ovarian  . Diabetes type II Father   . Heart attack Father   . Arthritis Sister   . Stroke Brother   . Coronary artery disease Brother   . Diabetes Other   . Heart attack Unknown   . Colon cancer Neg Hx   . Liver disease Neg Hx   . Ulcers Neg Hx     Social History:  Social History   Socioeconomic History  . Marital status: Widowed    Spouse name: Not on file  . Number of children: Not on file  . Years of education: Not on file  . Highest education level: Not on file  Occupational History  . Not on file  Social Needs  . Financial resource strain: Not on file  . Food insecurity:    Worry: Not on file    Inability: Not on file  . Transportation needs:    Medical: Not on file    Non-medical: Not on file  Tobacco Use  . Smoking status: Current Every Day Smoker    Packs/day: 0.50    Years: 40.00    Pack years: 20.00    Types: Cigarettes    Start date: 01/09/1969  . Smokeless tobacco: Never Used  Substance and Sexual Activity  . Alcohol use: No    Alcohol/week: 0.0 standard drinks  . Drug use: No  . Sexual activity: Not on file  Lifestyle  . Physical activity:    Days per week: Not on file    Minutes per session: Not on file  . Stress: Not on file  Relationships  . Social connections:    Talks on phone: Not on file    Gets together: Not on file    Attends religious service: Not on file    Active member of club or organization: Not on file    Attends meetings of clubs or organizations: Not on file    Relationship status: Not on file  Other Topics Concern  . Not on file  Social History Narrative  . Not on file    Allergies:  Allergies  Allergen Reactions  . Bee Venom Swelling  . Ciprofloxacin Other (See Comments)    Turns red from the chest up.  Marland Kitchen Hydrocodone     Rash   . Levaquin [Levofloxacin In D5w]     CANDIDA ALL OVER MOUTH TO PENIS  . Penicillins  Other (See Comments)    Reaction in childhood, caused patient to pass out Has patient had a PCN reaction causing immediate rash, facial/tongue/throat swelling, SOB or lightheadedness with hypotension:Yes Has patient had a PCN reaction causing severe rash involving mucus membranes or skin necrosis: No Has patient had  a PCN reaction that required hospitalization: No Has patient had a PCN reaction occurring within the last 10 years: No If all of the above answers are "NO", then may proceed with Cephalosporin use.   . Tramadol     Hives   . Sulfa Antibiotics Rash    Metabolic Disorder Labs: No results found for: HGBA1C, MPG No results found for: PROLACTIN No results found for: CHOL, TRIG, HDL, CHOLHDL, VLDL, LDLCALC Lab Results  Component Value Date   TSH 0.829 08/18/2015   TSH 1.440 05/19/2015    Therapeutic Level Labs: No results found for: LITHIUM No results found for: VALPROATE No components found for:  CBMZ  Current Medications: Current Outpatient Medications  Medication Sig Dispense Refill  . albuterol (PROVENTIL HFA;VENTOLIN HFA) 108 (90 BASE) MCG/ACT inhaler Inhale 2 puffs into the lungs every 6 (six) hours as needed for wheezing or shortness of breath.    Marland Kitchen albuterol (PROVENTIL) (2.5 MG/3ML) 0.083% nebulizer solution Take 2.5 mg by nebulization every 6 (six) hours as needed for wheezing or shortness of breath.    Derrill Memo ON 10/05/2018] ALPRAZolam (XANAX) 0.5 MG tablet Take 1 tablet (0.5 mg total) by mouth 2 (two) times daily as needed for anxiety or sleep. 60 tablet 1  . atorvastatin (LIPITOR) 80 MG tablet Take 40 mg by mouth every evening.     . budesonide (ENTOCORT EC) 3 MG 24 hr capsule 2 PO DAILY (Patient taking differently: Take 6 mg by mouth daily. ) 60 capsule 5  . budesonide-formoterol (SYMBICORT) 160-4.5 MCG/ACT inhaler Inhale 2 puffs into the lungs 2 (two) times daily.    . cholecalciferol (VITAMIN D) 1000 UNITS tablet Take 4,000 Units by mouth daily.     Marland Kitchen  EPINEPHrine 0.3 mg/0.3 mL IJ SOAJ injection Inject 0.3 mg into the muscle as needed (allergic reaction).    . gabapentin (NEURONTIN) 400 MG capsule Take 400 mg by mouth 4 (four) times daily.    Marland Kitchen lisinopril-hydrochlorothiazide (PRINZIDE,ZESTORETIC) 10-12.5 MG tablet Take 1 tablet by mouth daily.    Marland Kitchen omeprazole (PRILOSEC) 20 MG capsule 1 PO 30 MINS BEFORE YOUR FIRST MEAL EVERY DAY FOREVER (Patient taking differently: Take 20 mg by mouth daily as needed. 30 minutes before breakfast) 30 capsule 11  . sertraline (ZOLOFT) 100 MG tablet Take 1.5 tablets (150 mg total) by mouth daily. 135 tablet 0  . sildenafil (VIAGRA) 100 MG tablet Take 100 mg by mouth daily as needed for erectile dysfunction.    Marland Kitchen umeclidinium bromide (INCRUSE ELLIPTA) 62.5 MCG/INH AEPB Inhale 1 puff into the lungs every morning.      No current facility-administered medications for this visit.      Musculoskeletal: Strength & Muscle Tone: within normal limits Gait & Station: normal Patient leans: N/A  Psychiatric Specialty Exam: Review of Systems  Psychiatric/Behavioral: Positive for depression. Negative for hallucinations, memory loss, substance abuse and suicidal ideas. The patient is nervous/anxious and has insomnia.   All other systems reviewed and are negative.   Blood pressure 112/74, pulse 77, height 5\' 10"  (1.778 m), weight 166 lb (75.3 kg), SpO2 94 %.Body mass index is 23.82 kg/m.  General Appearance: Fairly Groomed  Eye Contact:  Good  Speech:  Clear and Coherent  Volume:  Normal  Mood:  Depressed  Affect:  Appropriate, Congruent, Tearful and down  Thought Process:  Coherent  Orientation:  Full (Time, Place, and Person)  Thought Content: Logical   Suicidal Thoughts:  No  Homicidal Thoughts:  No  Memory:  Immediate;  Good  Judgement:  Good  Insight:  Good  Psychomotor Activity:  Normal  Concentration:  Concentration: Good and Attention Span: Good  Recall:  Good  Fund of Knowledge: Good  Language:  Good  Akathisia:  No  Handed:  Right  AIMS (if indicated): not done  Assets:  Communication Skills Desire for Improvement  ADL's:  Intact  Cognition: WNL  Sleep:  Poor   Screenings:   Assessment and Plan:  Garrett Klein is a 62 y.o. year old male with a history of depression, hypertension,  history of hyperlipidemia,collagenous colitis , who presents for follow up appointment for Current moderate episode of major depressive disorder without prior episode ([place]) - Plan: TSH  # MDD, single episode without psychotic features Although there has been improvement in neurovegetative symptoms since switching from Lexapro to sertraline, he still does have residual symptoms of depression due to loss of his wife from pancreatic cancer.  Will do father up titration of sertraline to target depression.  Will continue Xanax as needed for anxiety at this time; he agrees to taper it off in the future.  Discussed risk of dependence and oversedation (Patient did have accidental overdose in 09/2017 with xanax and opioid).  Validated his grief.  Discussed behavioral activation.   Plan I have reviewed and updated plans as below 1. Increase sertraline 150 mg daily  2. Continue Xanax 0.5 mg twice a day as needed for anxiety  3. Check TSH 4.Return to clinic in two months for 30 mins - wrote a form to support the patient to have his cat as emotional pet   Past trials of medication:lexapro, mirtazapine (drowsiness),venlafaxine, xanax,  The patient demonstrates the following risk factors for suicide: Chronic risk factors for suicide include:psychiatric disorder ofdepression. Acute risk factorsfor suicide include: loss (financial, interpersonal, professional). Protective factorsfor this patient include: positive social support, coping skills and hope for the future. Considering these factors, the overall suicide risk at this point appears to below. Patientisappropriate for outpatient follow  up.  [name], MD 09/15/2018, 2:29 PM

## 2018-09-15 ENCOUNTER — Encounter (HOSPITAL_COMMUNITY): Payer: Self-pay | Admitting: Psychiatry

## 2018-09-15 ENCOUNTER — Ambulatory Visit (INDEPENDENT_AMBULATORY_CARE_PROVIDER_SITE_OTHER): Payer: Medicare Other | Admitting: Psychiatry

## 2018-09-15 VITALS — BP 112/74 | HR 77 | Ht 70.0 in | Wt 166.0 lb

## 2018-09-15 DIAGNOSIS — G4700 Insomnia, unspecified: Secondary | ICD-10-CM | POA: Diagnosis not present

## 2018-09-15 DIAGNOSIS — F419 Anxiety disorder, unspecified: Secondary | ICD-10-CM | POA: Diagnosis not present

## 2018-09-15 DIAGNOSIS — F321 Major depressive disorder, single episode, moderate: Secondary | ICD-10-CM

## 2018-09-15 MED ORDER — SERTRALINE HCL 100 MG PO TABS: 150.0000 mg | ORAL_TABLET | Freq: Every day | ORAL | 0 refills | Status: DC

## 2018-09-15 MED ORDER — ALPRAZOLAM 0.5 MG PO TABS: 0.5000 mg | ORAL_TABLET | Freq: Two times a day (BID) | ORAL | 1 refills | Status: DC | PRN

## 2018-09-15 NOTE — Patient Instructions (Signed)
1. Increase sertraline 150 mg daily  2. Continue Xanax 0.5 mg twice a day as needed for anxiety  3. Check TSH 4.Return to clinic in two months for 30 mins

## 2018-09-16 LAB — TSH: TSH: 1.27 mIU/L (ref 0.40–4.50)

## 2018-09-25 ENCOUNTER — Encounter (HOSPITAL_COMMUNITY): Payer: Self-pay

## 2018-09-28 ENCOUNTER — Encounter (HOSPITAL_COMMUNITY)
Admission: RE | Admit: 2018-09-28 | Discharge: 2018-09-28 | Disposition: A | Discharge status: Home / Self Care | Payer: Medicare Other | Source: Ambulatory Visit | Attending: Urology | Admitting: Urology

## 2018-10-02 ENCOUNTER — Ambulatory Visit (HOSPITAL_COMMUNITY): Payer: Medicare Other | Admitting: Anesthesiology

## 2018-10-02 ENCOUNTER — Encounter (HOSPITAL_COMMUNITY): Payer: Self-pay | Admitting: *Deleted

## 2018-10-02 ENCOUNTER — Ambulatory Visit (HOSPITAL_COMMUNITY)
Admission: RE | Admit: 2018-10-02 | Discharge: 2018-10-02 | Disposition: A | Discharge status: Home / Self Care | Payer: Medicare Other | Source: Ambulatory Visit | Attending: Urology | Admitting: Urology

## 2018-10-02 ENCOUNTER — Encounter (HOSPITAL_COMMUNITY)
Admission: RE | Disposition: A | Discharge status: Home / Self Care | Payer: Self-pay | Source: Ambulatory Visit | Attending: Urology

## 2018-10-02 DIAGNOSIS — Z888 Allergy status to other drugs, medicaments and biological substances status: Secondary | ICD-10-CM | POA: Diagnosis not present

## 2018-10-02 DIAGNOSIS — F329 Major depressive disorder, single episode, unspecified: Secondary | ICD-10-CM | POA: Insufficient documentation

## 2018-10-02 DIAGNOSIS — Z79899 Other long term (current) drug therapy: Secondary | ICD-10-CM | POA: Insufficient documentation

## 2018-10-02 DIAGNOSIS — Z885 Allergy status to narcotic agent status: Secondary | ICD-10-CM | POA: Diagnosis not present

## 2018-10-02 DIAGNOSIS — Z95810 Presence of automatic (implantable) cardiac defibrillator: Secondary | ICD-10-CM | POA: Insufficient documentation

## 2018-10-02 DIAGNOSIS — N433 Hydrocele, unspecified: Secondary | ICD-10-CM | POA: Insufficient documentation

## 2018-10-02 DIAGNOSIS — J45909 Unspecified asthma, uncomplicated: Secondary | ICD-10-CM | POA: Diagnosis not present

## 2018-10-02 DIAGNOSIS — Z881 Allergy status to other antibiotic agents status: Secondary | ICD-10-CM | POA: Insufficient documentation

## 2018-10-02 DIAGNOSIS — N500 Atrophy of testis: Secondary | ICD-10-CM | POA: Insufficient documentation

## 2018-10-02 DIAGNOSIS — I10 Essential (primary) hypertension: Secondary | ICD-10-CM | POA: Insufficient documentation

## 2018-10-02 DIAGNOSIS — E7800 Pure hypercholesterolemia, unspecified: Secondary | ICD-10-CM | POA: Diagnosis not present

## 2018-10-02 DIAGNOSIS — Z87891 Personal history of nicotine dependence: Secondary | ICD-10-CM | POA: Diagnosis not present

## 2018-10-02 DIAGNOSIS — F419 Anxiety disorder, unspecified: Secondary | ICD-10-CM | POA: Diagnosis not present

## 2018-10-02 DIAGNOSIS — Z880 Allergy status to penicillin: Secondary | ICD-10-CM | POA: Insufficient documentation

## 2018-10-02 DIAGNOSIS — N430 Encysted hydrocele: Secondary | ICD-10-CM | POA: Diagnosis not present

## 2018-10-02 DIAGNOSIS — Z882 Allergy status to sulfonamides status: Secondary | ICD-10-CM | POA: Diagnosis not present

## 2018-10-02 HISTORY — PX: HYDROCELE EXCISION: SHX482

## 2018-10-02 SURGERY — HYDROCELECTOMY
Anesthesia: General | Site: Scrotum | Laterality: Right

## 2018-10-02 MED ORDER — BUPIVACAINE HCL (PF) 0.25 % IJ SOLN
INTRAMUSCULAR | Status: AC
  Filled 2018-10-02: qty 30

## 2018-10-02 MED ORDER — LACTATED RINGERS IV SOLN: INTRAVENOUS | Status: DC

## 2018-10-02 MED ORDER — HYDROMORPHONE HCL 4 MG PO TABS
4.0000 mg | ORAL_TABLET | ORAL | Status: DC | PRN
  Filled 2018-10-02: qty 1

## 2018-10-02 MED ORDER — SODIUM CHLORIDE 0.9 % IV SOLN: 250.0000 mL | INTRAVENOUS | Status: DC | PRN

## 2018-10-02 MED ORDER — HYDROMORPHONE HCL 2 MG PO TABS: 2.0000 mg | ORAL_TABLET | ORAL | 0 refills | Status: DC | PRN

## 2018-10-02 MED ORDER — FENTANYL CITRATE (PF) 100 MCG/2ML IJ SOLN
25.0000 ug | INTRAMUSCULAR | Status: DC | PRN
  Administered 2018-10-02 (×2): 50 ug via INTRAVENOUS
  Filled 2018-10-02: qty 2

## 2018-10-02 MED ORDER — CEFAZOLIN SODIUM-DEXTROSE 2-4 GM/100ML-% IV SOLN
2.0000 g | INTRAVENOUS | Status: AC
  Administered 2018-10-02: 2 g via INTRAVENOUS

## 2018-10-02 MED ORDER — CEFAZOLIN SODIUM-DEXTROSE 2-4 GM/100ML-% IV SOLN
INTRAVENOUS | Status: AC
  Filled 2018-10-02: qty 100

## 2018-10-02 MED ORDER — EPHEDRINE SULFATE 50 MG/ML IJ SOLN
INTRAMUSCULAR | Status: DC | PRN
  Administered 2018-10-02: 10 mg via INTRAVENOUS

## 2018-10-02 MED ORDER — SODIUM CHLORIDE 0.9% FLUSH: 3.0000 mL | Freq: Two times a day (BID) | INTRAVENOUS | Status: DC

## 2018-10-02 MED ORDER — LORAZEPAM 2 MG/ML IJ SOLN
INTRAMUSCULAR | Status: AC
  Filled 2018-10-02: qty 1

## 2018-10-02 MED ORDER — FENTANYL CITRATE (PF) 100 MCG/2ML IJ SOLN
INTRAMUSCULAR | Status: DC | PRN
  Administered 2018-10-02 (×2): 25 ug via INTRAVENOUS
  Administered 2018-10-02: 50 ug via INTRAVENOUS

## 2018-10-02 MED ORDER — SODIUM CHLORIDE 0.9% FLUSH: 3.0000 mL | INTRAVENOUS | Status: DC | PRN

## 2018-10-02 MED ORDER — LORAZEPAM 2 MG/ML IJ SOLN
0.5000 mg | Freq: Once | INTRAMUSCULAR | Status: AC
  Administered 2018-10-02: 0.5 mg via INTRAVENOUS

## 2018-10-02 MED ORDER — IPRATROPIUM-ALBUTEROL 0.5-2.5 (3) MG/3ML IN SOLN
RESPIRATORY_TRACT | Status: AC
  Filled 2018-10-02: qty 3

## 2018-10-02 MED ORDER — FENTANYL CITRATE (PF) 100 MCG/2ML IJ SOLN
INTRAMUSCULAR | Status: AC
  Filled 2018-10-02: qty 2

## 2018-10-02 MED ORDER — BUPIVACAINE HCL (PF) 0.25 % IJ SOLN
INTRAMUSCULAR | Status: DC | PRN
  Administered 2018-10-02: 16 mL

## 2018-10-02 MED ORDER — PROPOFOL 10 MG/ML IV BOLUS
INTRAVENOUS | Status: DC | PRN
  Administered 2018-10-02: 30 mg via INTRAVENOUS
  Administered 2018-10-02: 170 mg via INTRAVENOUS
  Administered 2018-10-02: 30 mg via INTRAVENOUS
  Administered 2018-10-02: 20 mg via INTRAVENOUS

## 2018-10-02 MED ORDER — PROMETHAZINE HCL 25 MG/ML IJ SOLN: 6.2500 mg | INTRAMUSCULAR | Status: DC | PRN

## 2018-10-02 MED ORDER — MIDAZOLAM HCL 5 MG/5ML IJ SOLN
INTRAMUSCULAR | Status: DC | PRN
  Administered 2018-10-02: 2 mg via INTRAVENOUS

## 2018-10-02 MED ORDER — ONDANSETRON HCL 4 MG/2ML IJ SOLN
INTRAMUSCULAR | Status: AC
  Filled 2018-10-02: qty 2

## 2018-10-02 MED ORDER — MEPERIDINE HCL 50 MG/ML IJ SOLN: 6.2500 mg | INTRAMUSCULAR | Status: DC | PRN

## 2018-10-02 MED ORDER — ACETAMINOPHEN 325 MG PO TABS: 650.0000 mg | ORAL_TABLET | ORAL | Status: DC | PRN

## 2018-10-02 MED ORDER — PROPOFOL 10 MG/ML IV BOLUS
INTRAVENOUS | Status: AC
  Filled 2018-10-02: qty 20

## 2018-10-02 MED ORDER — MIDAZOLAM HCL 2 MG/2ML IJ SOLN
INTRAMUSCULAR | Status: AC
  Filled 2018-10-02: qty 2

## 2018-10-02 MED ORDER — ACETAMINOPHEN 650 MG RE SUPP
650.0000 mg | RECTAL | Status: DC | PRN
  Filled 2018-10-02: qty 1

## 2018-10-02 MED ORDER — 0.9 % SODIUM CHLORIDE (POUR BTL) OPTIME
TOPICAL | Status: DC | PRN
  Administered 2018-10-02: 1000 mL

## 2018-10-02 MED ORDER — IPRATROPIUM-ALBUTEROL 0.5-2.5 (3) MG/3ML IN SOLN
3.0000 mL | Freq: Once | RESPIRATORY_TRACT | Status: AC
  Administered 2018-10-02: 3 mL via RESPIRATORY_TRACT

## 2018-10-02 MED ORDER — LACTATED RINGERS IV SOLN
INTRAVENOUS | Status: DC
  Administered 2018-10-02: 1000 mL via INTRAVENOUS
  Administered 2018-10-02 (×2): via INTRAVENOUS

## 2018-10-02 MED ORDER — ONDANSETRON HCL 4 MG/2ML IJ SOLN
INTRAMUSCULAR | Status: DC | PRN
  Administered 2018-10-02: 4 mg via INTRAVENOUS

## 2018-10-02 SURGICAL SUPPLY — 26 items
BNDG GAUZE ELAST 4 BULKY (GAUZE/BANDAGES/DRESSINGS) ×2 IMPLANT
CLOTH BEACON ORANGE TIMEOUT ST (SAFETY) ×2 IMPLANT
COVER LIGHT HANDLE STERIS (MISCELLANEOUS) ×4 IMPLANT
DRAIN PENROSE 12X.25 LTX STRL (MISCELLANEOUS) ×2 IMPLANT
DRAIN PENROSE 18X1/2 LTX STRL (DRAIN) ×2 IMPLANT
ELECT REM PT RETURN 9FT ADLT (ELECTROSURGICAL) ×2
ELECTRODE REM PT RTRN 9FT ADLT (ELECTROSURGICAL) ×1 IMPLANT
GAUZE SPONGE 4X4 12PLY STRL (GAUZE/BANDAGES/DRESSINGS) ×2 IMPLANT
GLOVE BIO SURGEON STRL SZ7 (GLOVE) ×2 IMPLANT
GLOVE BIOGEL PI IND STRL 7.0 (GLOVE) ×2 IMPLANT
GLOVE BIOGEL PI INDICATOR 7.0 (GLOVE) ×2
GLOVE SURG SS PI 8.0 STRL IVOR (GLOVE) ×2 IMPLANT
GOWN STRL REUS W/TWL LRG LVL3 (GOWN DISPOSABLE) ×4 IMPLANT
INST SET MINOR GENERAL (KITS) ×2 IMPLANT
NEEDLE HYPO 25X1 1.5 SAFETY (NEEDLE) ×2 IMPLANT
NS IRRIG 1000ML POUR BTL (IV SOLUTION) ×2 IMPLANT
PACK MINOR (CUSTOM PROCEDURE TRAY) ×2 IMPLANT
SET BASIN LINEN APH (SET/KITS/TRAYS/PACK) ×2 IMPLANT
SOL PREP PROV IODINE SCRUB 4OZ (MISCELLANEOUS) ×2 IMPLANT
SPONGE LAP 4X18 RFD (DISPOSABLE) ×4 IMPLANT
SUPPORT SCROTAL LRG NO STRP (SOFTGOODS) IMPLANT
SUPPORT SCROTAL MEDIUM (SOFTGOODS) ×2 IMPLANT
SUT CHROMIC 3 0 SH 27 (SUTURE) ×8 IMPLANT
SYR 10ML LL (SYRINGE) ×2 IMPLANT
TOWEL OR 17X26 4PK STRL BLUE (TOWEL DISPOSABLE) ×2 IMPLANT
YANKAUER SUCT 12FT TUBE ARGYLE (SUCTIONS) ×2 IMPLANT

## 2018-10-02 NOTE — Anesthesia Postprocedure Evaluation (Signed)
Anesthesia Post Note  Patient: Garrett Klein  Procedure(s) Performed: HYDROCELECTOMY ADULT (Right Scrotum)  Anesthesia Type: General Level of consciousness: awake and patient cooperative Pain management: pain level controlled Vital Signs Assessment: post-procedure vital signs reviewed and stable Respiratory status: spontaneous breathing, nonlabored ventilation and respiratory function stable Cardiovascular status: blood pressure returned to baseline Postop Assessment: no apparent nausea or vomiting Anesthetic complications: no     Last Vitals:  Vitals:   10/02/18 1400 10/02/18 1411  BP: 110/69 122/66  Pulse: 88 89  Resp: 14 18  Temp:  36.7 C  SpO2: 98% 93%    Last Pain:  Vitals:   10/02/18 1411  TempSrc: Oral  PainSc: 4                  Daevion Navarette J

## 2018-10-02 NOTE — Transfer of Care (Signed)
Immediate Anesthesia Transfer of Care Note  Patient: Garrett Klein  Procedure(s) Performed: HYDROCELECTOMY ADULT (Right Scrotum)  Patient Location: PACU  Anesthesia Type:General  Level of Consciousness: awake and patient cooperative  Airway & Oxygen Therapy: Patient Spontanous Breathing  Post-op Assessment: Report given to RN, Post -op Vital signs reviewed and stable and Patient moving all extremities  Post vital signs: Reviewed and stable  Last Vitals:  Vitals Value Taken Time  BP    Temp    Pulse    Resp    SpO2      Last Pain:  Vitals:   10/02/18 1100  PainSc: 0-No pain         Complications: No apparent anesthesia complications

## 2018-10-02 NOTE — Op Note (Signed)
Procedure: Right hydrocelectomy.  Preop diagnosis: Right hydrocele.  Postop diagnosis: Same.  Surgeon: Dr. Irine Seal.  Anesthesia: General.  Specimen: None.  Drain: Half-inch Penrose scrotal drain.  EBL: Minimal.  Complications: None.  Indications: Garrett Klein is a 62 year old white male with a symptomatic right hydrocele who is elected hydrocelectomy.  Procedure: He was taken operating room where he was given 2 g of Ancef.  He was placed in supine position and a general anesthetic was induced.  He was fitted with PAS hose.  His scrotum was clipped and he was prepped with Betadine solution and draped in usual sterile fashion.  A right anterior oblique incision was made along the skin lines over the hydrocele.  The dartos was incised with the Bovie.  The testicle within the hydrocele sac was delivered from the wound and dissected back to the cord.  A cord block was performed with 8 mL of quarter percent Marcaine.  The hydrocele sac was then opened and drained.  There were several prominent veins noted on the hydrocele sac there was fulgurated and oversewn as needed.  The sac was then imbricated behind the testicle in a water bottle fashion using a running locked 3-0 chromic suture.  When hemostasis was assured, the testicle was returned to the right hemiscrotum.  Half inch Penrose drain was placed through separate stab wound in the inferior right hemiscrotum.  The dartos was then closed using a running 3-0 chromic.  An additional 5 to 6 cc of quarter percent Marcaine was infiltrated around the incision.  The skin was closed using a running vertical mattress 3-0 chromic.  The drain was trimmed to approximately 2 inches in length outside the incision but not secured to the skin.  The wound was cleansed and a dressing of 4 x 4's, fluffs Curlex and a scrotal support was applied.  His anesthetic was reversed and he was moved to recovery room in stable condition.  There were no complications.

## 2018-10-02 NOTE — Anesthesia Procedure Notes (Signed)
Procedure Name: LMA Insertion Date/Time: 10/02/2018 12:32 PM Performed by: Charmaine Downs, CRNA Pre-anesthesia Checklist: Patient identified, Patient being monitored, Emergency Drugs available, Timeout performed and Suction available Patient Re-evaluated:Patient Re-evaluated prior to induction Oxygen Delivery Method: Circle System Utilized Preoxygenation: Pre-oxygenation with 100% oxygen Induction Type: IV induction Ventilation: Mask ventilation without difficulty LMA: LMA inserted LMA Size: 4.0 Number of attempts: 1 Placement Confirmation: positive ETCO2 and breath sounds checked- equal and bilateral Tube secured with: Tape Dental Injury: Teeth and Oropharynx as per pre-operative assessment

## 2018-10-02 NOTE — Anesthesia Preprocedure Evaluation (Signed)
Anesthesia Evaluation    Airway Mallampati: II       Dental  (+) Edentulous Lower, Edentulous Upper   Pulmonary pneumonia, COPD,  COPD inhaler, Current Smoker,     + decreased breath sounds+ wheezing      Cardiovascular hypertension, On Medications + CAD   Rhythm:regular     Neuro/Psych Depression    GI/Hepatic PUD,   Endo/Other    Renal/GU      Musculoskeletal   Abdominal   Peds  Hematology   Anesthesia Other Findings No resp tx this am- DuoNeb preop Xanax tolerant/abuse.  Increased anxiety, no AM xanax- tx preop w/ ativan 0.5 mg  Reproductive/Obstetrics                             Anesthesia Physical Anesthesia Plan  ASA: IV  Anesthesia Plan: General   Post-op Pain Management:    Induction:   PONV Risk Score and Plan:   Airway Management Planned:   Additional Equipment:   Intra-op Plan:   Post-operative Plan:   Informed Consent:   Plan Discussed with: Anesthesiologist  Anesthesia Plan Comments:         Anesthesia Quick Evaluation

## 2018-10-02 NOTE — Discharge Instructions (Addendum)
HOME CARE INSTRUCTIONS FOR SCROTAL PROCEDURES  Wound Care & Hygiene: You may apply an ice bag to the scrotum for the first 24 hours.  This may help decrease swelling and soreness.  You may have a dressing held in place by an athletic supporter.  You may remove the dressing in 24 hours and shower in 48 hours.  Continue to use the athletic supporter or tight briefs for at least a week. Activity: Rest today - not necessarily flat bed rest.  Just take it easy.  You should not do strenuous activities until your follow-up visit with your doctor.  You may resume light activity in 48 hours.  Return to Work:  Your doctor will advise you of this depending on the type of work you do  Diet: Drink liquids or eat a light diet this evening.  You may resume a regular diet tomorrow.  General Expectations: You may have a small amount of bleeding.  The scrotum may be swollen or bruised for about a week.  Call your Doctor if these occur:  -persistent or heavy bleeding  -temperature of 101 degrees or more  -severe pain, not relieved by your pain medication  You may remove the drain in the morning.  It should slide out without difficulty.  If you don't feel you can remove it, please contact our office Monday to arrange removal on Monday or Tuesday.    Patient Signature:  __________________________________________________  Nurse's Signature:  __________________________________________________       General Anesthesia, Adult, Care After These instructions provide you with information about caring for yourself after your procedure. Your health care provider may also give you more specific instructions. Your treatment has been planned according to current medical practices, but problems sometimes occur. Call your health care provider if you have any problems or questions after your procedure. What can I expect after the procedure? After the procedure, it is common to have:  Vomiting.  A sore  throat.  Mental slowness.  It is common to feel:  Nauseous.  Cold or shivery.  Sleepy.  Tired.  Sore or achy, even in parts of your body where you did not have surgery.  Follow these instructions at home: For at least 24 hours after the procedure:  Do not: ? Participate in activities where you could fall or become injured. ? Drive. ? Use heavy machinery. ? Drink alcohol. ? Take sleeping pills or medicines that cause drowsiness. ? Make important decisions or sign legal documents. ? Take care of children on your own.  Rest. Eating and drinking  If you vomit, drink water, juice, or soup when you can drink without vomiting.  Drink enough fluid to keep your urine clear or pale yellow.  Make sure you have little or no nausea before eating solid foods.  Follow the diet recommended by your health care provider. General instructions  Have a responsible adult stay with you until you are awake and alert.  Return to your normal activities as told by your health care provider. Ask your health care provider what activities are safe for you.  Take over-the-counter and prescription medicines only as told by your health care provider.  If you smoke, do not smoke without supervision.  Keep all follow-up visits as told by your health care provider. This is important. Contact a health care provider if:  You continue to have nausea or vomiting at home, and medicines are not helpful.  You cannot drink fluids or start eating again.  You cannot  urinate after 8-12 hours.  You develop a skin rash.  You have fever.  You have increasing redness at the site of your procedure. Get help right away if:  You have difficulty breathing.  You have chest pain.  You have unexpected bleeding.  You feel that you are having a life-threatening or urgent problem. This information is not intended to replace advice given to you by your health care provider. Make sure you discuss any  questions you have with your health care provider. Document Released: 02/10/2001 Document Revised: 04/08/2016 Document Reviewed: 10/19/2015 Elsevier Interactive Patient Education  [employer].

## 2018-10-02 NOTE — H&P (Signed)
CC: I have swelling in my scrotum.  HPI: Garrett Klein is a 62 year-old male patient who was referred by Dr. Weldon Picking C. Manuella Ghazi, MD who is here for scrotal swelling.    Garrett Klein is a 62 yo WM who is sent in consultation by Dr. Manuella Ghazi for right scrotal swelling. He had a right inguinal hernia repair in 2/19 and has had progressive swelling since the surgery but he had some swelling before the surgery. He has some pain with it intermittently. He is voiding well with an IPSS of 3. He has had no other GU History. He has no associated signs or symptoms. He had a scrotal US prior to this visit which confirms a right hydrocele.      ALLERGIES: Ciprofloxacin Hydrocodone Levaquin Penicillin SULFA Tramadol    MEDICATIONS: Omeprazole 20 mg tablet, delayed release 1 tablet PO Daily  Albuterol Sulfate 2.5 mg/3 ml (0.083 %) vial, nebulizer 1 PO Daily  Albuterol Sulfate Hfa 1 PO Daily  Alprazolam 0.5 mg tablet 1 tablet PO Daily  Atorvastatin Calcium 80 mg tablet 1 tablet PO Daily  Budesonide 1 PO Daily  Cholecalciferol 1 PO Daily  Epinephrine 0.3 mg/0.3 ml auto-injector 1 PO Daily  Gabapentin 400 mg capsule 1 capsule PO Daily  Incruse Ellipta 62.5 mcg/actuation blister, with inhalation device 1 PO Daily  Lisinopril-Hydrochlorothiazide 10 mg-12.5 mg tablet 1 tablet PO Daily  Sertraline Hcl 100 mg tablet 1 tablet PO Daily  Sildenafil Citrate 100 mg tablet 1 tablet PO Daily  Symbicort 160 mcg-4.5 mcg/actuation hfa aerosol with adapter 1 PO Daily     GU PSH: None   NON-GU PSH: Back surgery, L4,L5 - 2003 Hernia Repair - 01/14/2018 Hip Replacement, Left - 2015 Shoulder Surgery (Unspecified), Right - 2014    GU PMH: None   NON-GU PMH: Anxiety Asthma Depression Duodenal ulcer, unspecified as acute or chronic, without hemorrhage or perforation Heartburn Hypercholesterolemia Hypertension    FAMILY HISTORY: Heart Attack - Father   SOCIAL HISTORY: Marital Status: Widowed Preferred Language:  English; Race: White Current Smoking Status: Patient does not smoke anymore. Has not smoked since 07/19/2016. Smoked for 40 years. Smoked 1/2 pack per day.   Tobacco Use Assessment Completed: Used Tobacco in last 30 days? Does not drink anymore.  Does not drink caffeine.    REVIEW OF SYSTEMS:    GU Review Male:   Patient denies frequent urination, hard to postpone urination, burning/ pain with urination, get up at night to urinate, leakage of urine, stream starts and stops, trouble starting your stream, have to strain to urinate , erection problems, and penile pain.  Gastrointestinal (Upper):   Patient reports indigestion/ heartburn. Patient denies nausea and vomiting.  Gastrointestinal (Lower):   Patient reports diarrhea. Patient denies constipation.  Constitutional:   Patient denies fever, night sweats, weight loss, and fatigue.  Skin:   Patient denies skin rash/ lesion and itching.  Eyes:   Patient denies blurred vision and double vision.  Ears/ Nose/ Throat:   Patient denies sore throat and sinus problems.  Hematologic/Lymphatic:   Patient reports easy bruising. Patient denies swollen glands.  Cardiovascular:   Patient denies leg swelling and chest pains.  Respiratory:   Patient reports shortness of breath. Patient denies cough.  Endocrine:   Patient denies excessive thirst.  Musculoskeletal:   Patient reports back pain and joint pain.   Neurological:   Patient denies headaches and dizziness.  Psychologic:   His wife died in 2017/12/04. Patient reports depression and anxiety.  VITAL SIGNS:      08/14/2018 03:24 PM  Weight 168 lb / 76.2 kg  Height 70 in / 177.8 cm  BP 150/76 mmHg  Pulse 78 /min  Temperature 98.9 F / 37.1 C  BMI 24.1 kg/m   GU PHYSICAL EXAMINATION:    Scrotum: No lesions. No edema. No cysts. No warts.  Epididymides: Right: no spermatocele, no masses, no cysts, no tenderness, no induration, no enlargement. Left: no spermatocele, no masses, no cysts, no  tenderness, no induration, no enlargement.  Testes: Tender right testis. 3-5 cm hydrocele right testis. No tenderness, no swelling, no enlargement left testis. No swelling, no enlargement right testis. Normal location left testis. Normal location right testis. No mass, no cyst, no varicocele, no hydrocele left testis. No mass, no cyst, no varicocele right testis.   Urethral Meatus: Normal size. No lesion, no wart, no discharge, no polyp. Normal location.  Penis: Penis uncircumcised. No foreskin warts, no cracks. No dorsal peyronie's plaques, no left corporal peyronie's plaques, no right corporal peyronie's plaques, no scarring, no shaft warts. No balanitis, no meatal stenosis.    MULTI-SYSTEM PHYSICAL EXAMINATION:    Constitutional: Well-nourished. No physical deformities. Normally developed. Good grooming.  Neck: Neck symmetrical, not swollen. Normal tracheal position.  Respiratory: No labored breathing, no use of accessory muscles. CTA   Cardiovascular: Normal temperature,RRR without murmur.   Lymphatic: No enlargement of neck, axillae, groin.  Skin: No paleness, no jaundice, no cyanosis. No lesion, no ulcer, no rash.  Neurologic / Psychiatric: Oriented to time, oriented to place, oriented to person. No depression, no anxiety, no agitation.  Gastrointestinal: Left inguinal hernia. No mass, no tenderness, no rigidity, non obese abdomen.   Musculoskeletal: Normal gait and station of head and neck.     PAST DATA REVIEWED:  Source Of History:  Patient  Records Review:   AUA Symptom Score, Previous Doctor Records  Urine Test Review:   Urinalysis  X-Ray Review: Scrotal Ultrasound: Reviewed Films. Reviewed Report. Discussed With Patient.  C.T. Abdomen/Pelvis: Reviewed Films. Reviewed Report. He has an impending LIH with some bladder involvement.    Notes:                     Records from Dr. Manuella Ghazi reviewed.    PROCEDURES:          Urinalysis - 81003 Dipstick Dipstick Cont'd  Specimen: Voided  Bilirubin: Neg  Color: Yellow Ketones: Neg  Appearance: Clear Blood: Trace Intact  Specific Gravity: <= 1.005 Protein: Neg  pH: 6.0 Urobilinogen: 0.2  Glucose: Neg Nitrites: Neg    Leukocyte Esterase: Neg    ASSESSMENT:      ICD-10 Details  1 GU:   Hydrocele - N43.0 He has a moderate soft right hydrocele with associated pain that is difficult to say is entirely from the hydrocele or from testicular pain. I discussed options and will get him set up for a right hydrocelectomy. I reviewed the risks of bleeding, infection, recurrent hydrocele, persistent pain, testicular injury, thrombotic events and anesthetic complications.   2   Right testicular pain - N50.811   3   Testicular atrophy - N50.0 The left testicle has moderate atrophy.    PLAN:           Orders Labs Urinalysis w/Scope          Schedule Return Visit/Planned Activity: Next Available Appointment - Schedule Surgery          Document Letter(s):  Created for Patient: Clinical Summary

## 2018-10-05 ENCOUNTER — Encounter (HOSPITAL_COMMUNITY): Payer: Self-pay | Admitting: Urology

## 2018-10-08 ENCOUNTER — Ambulatory Visit: Payer: Medicare Other | Admitting: Gastroenterology

## 2018-10-08 NOTE — Progress Notes (Deleted)
   Subjective:    Patient ID: Garrett Klein, male    DOB: June 24, 1956, 62 y.o.   MRN: [id number]  HPI    Review of Systems     Objective:   Physical Exam        Assessment & Plan:

## 2018-10-23 ENCOUNTER — Ambulatory Visit: Payer: Self-pay | Admitting: Urology

## 2018-12-04 ENCOUNTER — Other Ambulatory Visit (HOSPITAL_COMMUNITY): Payer: Self-pay | Admitting: Psychiatry

## 2018-12-04 MED ORDER — ALPRAZOLAM 0.5 MG PO TABS: 0.5000 mg | ORAL_TABLET | Freq: Two times a day (BID) | ORAL | 1 refills | Status: DC | PRN

## 2018-12-05 NOTE — Progress Notes (Signed)
[facility] MD/PA/NP OP Progress Note  12/07/2018 2:12 PM Garrett Klein  MRN:  [id number]  Chief Complaint:  Chief Complaint    Depression; Follow-up     HPI:  Patient presents for follow-up appointment for depression.  He states that it was horrible on holiday.  His transmission broke and he could not go anywhere.  He stayed at home by himself on Christmas.  He is hoping to go to hospice care and do volunteer when the transmission is fixed. He cries when he talks about his wife.  Although he believes that she would have told him to let her go, he is unable to do so. He reports chronic diarrhea.  Although he is unsure of the onset, it was around the time he was started on sertraline.  He has insomnia if he does not take Xanax.  He feels depressed.  He has no energy and motivation.  He denies SI.  He feels anxious, tense and has occasional panic attacks.    Wt Readings from Last 3 Encounters:  12/07/18 170 lb (77.1 kg)  09/15/18 166 lb (75.3 kg)  08/13/18 168 lb 6.4 oz (76.4 kg)   Xanax filled on 12/04/2018   Visit Diagnosis:    ICD-10-CM   1. Current moderate episode of major depressive disorder without prior episode ([place]) F32.1     Past Psychiatric History: Please see initial evaluation for full details. I have reviewed the history. No updates at this time.     Past Medical History:  Past Medical History:  Diagnosis Date  . Chronic lower back pain   . Collagenous colitis [address] [place]  . COPD (chronic obstructive pulmonary disease) ([place])   . Essential hypertension   . Hyperlipidemia   . MRSA infection   . PUD (peptic ulcer disease)     Past Surgical History:  Procedure Laterality Date  . BACK SURGERY    . BIOPSY  06/03/2017   Procedure: BIOPSY;  Surgeon: Danie Binder, MD;  Location: [facility];  Service: Endoscopy;;  colon  . COLONOSCOPY  2001   Dr. Sharlett Iles, normal  . COLONOSCOPY  04/2013   [facility]: Fentanyl 200mg Danford Bad 10mg : sigmoid diverticulosis, normal terminal  ileum, small internal hemorrhoids, random colon bx: collagenous colitis. next tcs 04/2018  . COLONOSCOPY  2011   [facility]: two polyps, sessile serrated adenoma, mild diverticulosis  . COLONOSCOPY WITH PROPOFOL N/A 06/03/2017   Dr. Oneida Alar: Diverticulosis, three 2 to 3 mm polyps removed from the mid transverse colon and cecum, three 4 to 6 mm polyps in the rectum and mid descending colon removed.  External/internal hemorrhoids.  hyperplastic polyps.  Random colon biopsies negative for microscopic colitis. next colonoscopy in 5 years.  . ELBOW BURSA SURGERY    . ESOPHAGEAL DILATION N/A 05/19/2015   Procedure: ESOPHAGEAL DILATION;  Surgeon: Danie Binder, MD;  Location: [facility];  Service: Endoscopy;  Laterality: N/A;  . ESOPHAGOGASTRODUODENOSCOPY N/A 10/28/2014   [facility]: 1. Esophagitis due to GERD. KOH neg. 2. Small hiatal hernia 3. Moderate erosive gastritis 4. RUQ pain due to large ulcer 5. [place] duodentis in the bulb.   . ESOPHAGOGASTRODUODENOSCOPY N/A 05/19/2015   [facility]: 1. Mild distal esophagitis 2. Peptic stricture at the gastroesophageal junction 3. Mild non-erosive gastritis 4. Pseudo pylorus due to prior PUD.   Marland Kitchen ESOPHAGOGASTRODUODENOSCOPY N/A 03/07/2017   Dr. Oneida Alar: Benign-appearing esophageal stenosis status post dilation, mild gastritis  . FINGER AMPUTATION Left 1985   index finger  . HYDROCELE  EXCISION Right 10/02/2018   Procedure: HYDROCELECTOMY ADULT;  Surgeon: Irine Seal, MD;  Location: [facility];  Service: Urology;  Laterality: Right;  . JOINT REPLACEMENT     left hip surgery - January 26, 2014  . POLYPECTOMY  06/03/2017   Procedure: POLYPECTOMY;  Surgeon: Danie Binder, MD;  [facility];  Service: Endoscopy;;  colon  . ROTATOR CUFF REPAIR     November 2014  . SAVORY DILATION N/A 03/07/2017   Procedure: SAVORY DILATION;  Surgeon: Danie Binder, MD;  [facility];  Service: Endoscopy;  Laterality: N/A;    Family Psychiatric History: Please see initial  evaluation for full details. I have reviewed the history. No updates at this time.     Family History:  Family History  Problem Relation Age of Onset  . Cancer Mother        ovarian  . Diabetes type II Father   . Heart attack Father   . Arthritis Sister   . Stroke Brother   . Coronary artery disease Brother   . Diabetes Other   . Heart attack Unknown   . Colon cancer Neg Hx   . Liver disease Neg Hx   . Ulcers Neg Hx     Social History:  Social History   Socioeconomic History  . Marital status: Widowed    Spouse name: Not on file  . Number of children: Not on file  . Years of education: Not on file  . Highest education level: Not on file  Occupational History  . Not on file  Social Needs  . Financial resource strain: Not on file  . Food insecurity:    Worry: Not on file    Inability: Not on file  . Transportation needs:    Medical: Not on file    Non-medical: Not on file  Tobacco Use  . Smoking status: Current Every Day Smoker    Packs/day: 0.50    Years: 40.00    Pack years: 20.00    Types: Cigarettes    Start date: 01/09/1969  . Smokeless tobacco: Never Used  Substance and Sexual Activity  . Alcohol use: No    Alcohol/week: 0.0 standard drinks  . Drug use: No  . Sexual activity: Not on file  Lifestyle  . Physical activity:    Days per week: Not on file    Minutes per session: Not on file  . Stress: Not on file  Relationships  . Social connections:    Talks on phone: Not on file    Gets together: Not on file    Attends religious service: Not on file    Active member of club or organization: Not on file    Attends meetings of clubs or organizations: Not on file    Relationship status: Not on file  Other Topics Concern  . Not on file  Social History Narrative  . Not on file    Allergies:  Allergies  Allergen Reactions  . Bee Venom Swelling  . Ciprofloxacin Other (See Comments)    Turns red from the chest up.  Mack [place] [Levofloxacin In D5w]  Other (See Comments)    CANDIDA ALL OVER MOUTH TO PENIS  . Tramadol Hives       . Hydrocodone Rash  . Sulfa Antibiotics Rash    Metabolic Disorder Labs: No results found for: HGBA1C, MPG No results found for: PROLACTIN No results found for: CHOL, TRIG, HDL, CHOLHDL, VLDL, LDLCALC Lab Results  Component  Value Date   TSH 1.27 09/15/2018   TSH 0.829 08/18/2015    Therapeutic Level Labs: No results found for: LITHIUM No results found for: VALPROATE No components found for:  CBMZ  Current Medications: Current Outpatient Medications  Medication Sig Dispense Refill  . albuterol (PROVENTIL HFA;VENTOLIN HFA) 108 (90 BASE) MCG/ACT inhaler Inhale 2 puffs into the lungs every 6 (six) hours as needed for wheezing or shortness of breath.    Marland Kitchen albuterol (PROVENTIL) (2.5 MG/3ML) 0.083% nebulizer solution Take 2.5 mg by nebulization every 6 (six) hours as needed for wheezing or shortness of breath.    . ALPRAZolam (XANAX) 0.5 MG tablet Take 1 tablet (0.5 mg total) by mouth 2 (two) times daily as needed for anxiety or sleep. 60 tablet 1  . atorvastatin (LIPITOR) 80 MG tablet Take 40 mg by mouth every evening.     . budesonide-formoterol (SYMBICORT) 160-4.5 MCG/ACT inhaler Inhale 2 puffs into the lungs 2 (two) times daily.    Marland Kitchen EPINEPHrine 0.3 mg/0.3 mL IJ SOAJ injection Inject 0.3 mg into the muscle once.     Marland Kitchen HYDROmorphone (DILAUDID) 2 MG tablet Take 1 tablet (2 mg total) by mouth every 4 (four) hours as needed for severe pain. 8 tablet 0  . lisinopril-hydrochlorothiazide (PRINZIDE,ZESTORETIC) 10-12.5 MG tablet Take 1 tablet by mouth daily.    Marland Kitchen omeprazole (PRILOSEC) 20 MG capsule 1 PO 30 MINS BEFORE YOUR FIRST MEAL EVERY DAY FOREVER (Patient taking differently: Take 20 mg by mouth daily as needed (for acid reflux). ) 30 capsule 11  . sertraline (ZOLOFT) 100 MG tablet Take 1.5 tablets (150 mg total) by mouth daily. 135 tablet 0  . umeclidinium bromide (INCRUSE ELLIPTA) 62.5 MCG/INH AEPB Inhale 1  puff into the lungs every morning.     . nortriptyline (PAMELOR) 25 MG capsule 25 mg at night for one week, then 50 mg at night 60 capsule 1   No current facility-administered medications for this visit.      Musculoskeletal: Strength & Muscle Tone: within normal limits Gait & Station: normal Patient leans: N/A  Psychiatric Specialty Exam: Review of Systems  Psychiatric/Behavioral: Positive for depression. Negative for hallucinations, memory loss, substance abuse and suicidal ideas. The patient is nervous/anxious and has insomnia.   All other systems reviewed and are negative.   Blood pressure 130/76, pulse 82, height 5\' 10"  (1.778 m), weight 170 lb (77.1 kg), SpO2 98 %.Body mass index is 24.39 kg/m.  General Appearance: Fairly Groomed  Eye Contact:  Good  Speech:  Clear and Coherent  Volume:  Normal  Mood:  Depressed  Affect:  Appropriate, Congruent and Tearful  Thought Process:  Coherent  Orientation:  Full (Time, Place, and Person)  Thought Content: Logical   Suicidal Thoughts:  No  Homicidal Thoughts:  No  Memory:  Immediate;   Good  Judgement:  Good  Insight:  Fair  Psychomotor Activity:  Normal  Concentration:  Concentration: Good and Attention Span: Good  Recall:  Good  Fund of Knowledge: Good  Language: Good  Akathisia:  No  Handed:  Right  AIMS (if indicated): not done  Assets:  Communication Skills Desire for Improvement  ADL's:  Intact  Cognition: WNL  Sleep:  Fair   Screenings:   Assessment and Plan:  Garrett Klein is a 62 y.o. year old male with a history of depression, hypertension,  history of hyperlipidemia,collagenous colitis  , who presents for follow up appointment for Current moderate episode of major depressive disorder without prior  episode [facility])  # MDD, single episode without psychotic features He reports depressive symptoms despite up titration of sertraline.  Psychosocial stressors including loss of his wife from pancreatic cancer.   Will switch from sertraline to nortriptyline especially given his chronic diarrhea, which may have started since starting sertraline.  Discussed potential risk of serotonin syndrome.  Discussed potential side effect of tachycardia, drowsiness.  He has no known history of cardiac disease.  Will continue Xanax as needed for anxiety.  Discussed risk of dependence and oversedation(Patient did have accidental overdose in 09/2017 with xanax and opioid).  Validated his grief.  Discussed behavioral activation.   Plan I have reviewed and updated plans as below 1. Decrease sertraline 100 mg daily for one week, then 50 mg daily for one week, then discontinue 2. Start nortriptyline 25 mg at night for one week, then 50 mg at night  3. Continue Xanax 0.5 mg twice a day as needed for anxiety  4. Return to clinic in two months for 30 mins  Past trials of medication:lexapro,mirtazapine (drowsiness),venlafaxine, xanax,  The patient demonstrates the following risk factors for suicide: Chronic risk factors for suicide include:psychiatric disorder ofdepression. Acute risk factorsfor suicide include: loss (financial, interpersonal, professional). Protective factorsfor this patient include: positive social support, coping skills and hope for the future. Considering these factors, the overall suicide risk at this point appears to below. Patientisappropriate for outpatient follow up.  The duration of this appointment visit was 30 minutes of face-to-face time with the patient.  Greater than 50% of this time was spent in counseling, explanation of  diagnosis, planning of further management, and coordination of care.  [name], MD 12/07/2018, 2:12 PM

## 2018-12-07 ENCOUNTER — Ambulatory Visit (INDEPENDENT_AMBULATORY_CARE_PROVIDER_SITE_OTHER): Payer: Medicare Other | Admitting: Psychiatry

## 2018-12-07 ENCOUNTER — Encounter (HOSPITAL_COMMUNITY): Payer: Self-pay | Admitting: Psychiatry

## 2018-12-07 VITALS — BP 130/76 | HR 82 | Ht 70.0 in | Wt 170.0 lb

## 2018-12-07 DIAGNOSIS — F321 Major depressive disorder, single episode, moderate: Secondary | ICD-10-CM

## 2018-12-07 MED ORDER — NORTRIPTYLINE HCL 25 MG PO CAPS: ORAL_CAPSULE | ORAL | 1 refills | Status: DC

## 2018-12-07 NOTE — Patient Instructions (Signed)
1. Decrease sertraline 100 mg daily for one week, then 50 mg daily for one week, then discontinue 2. Start nortriptyline 25 mg at night for one week, then 50 mg at night  3. Continue Xanax 0.5 mg twice a day as needed for anxiety  4. Return to clinic in two months for 30 mins

## 2018-12-23 ENCOUNTER — Ambulatory Visit: Payer: Medicare Other | Admitting: Gastroenterology

## 2019-01-22 ENCOUNTER — Ambulatory Visit (INDEPENDENT_AMBULATORY_CARE_PROVIDER_SITE_OTHER): Payer: Medicare Other | Admitting: Urology

## 2019-01-22 DIAGNOSIS — N430 Encysted hydrocele: Secondary | ICD-10-CM

## 2019-01-25 ENCOUNTER — Other Ambulatory Visit (HOSPITAL_COMMUNITY): Payer: Self-pay | Admitting: Psychiatry

## 2019-02-01 ENCOUNTER — Other Ambulatory Visit (HOSPITAL_COMMUNITY): Payer: Self-pay | Admitting: Psychiatry

## 2019-02-01 ENCOUNTER — Telehealth (HOSPITAL_COMMUNITY): Payer: Self-pay

## 2019-02-01 MED ORDER — ALPRAZOLAM 0.5 MG PO TABS: 0.5000 mg | ORAL_TABLET | Freq: Two times a day (BID) | ORAL | 0 refills | Status: DC | PRN

## 2019-02-01 MED ORDER — NORTRIPTYLINE HCL 50 MG PO CAPS: 50.0000 mg | ORAL_CAPSULE | Freq: Every day | ORAL | 0 refills | Status: DC

## 2019-02-01 NOTE — Telephone Encounter (Signed)
Medication refill request - Telephone call with patient who reported he is out of Alprazolam and will be of Nortriptyline before appointment scheduled 02/08/19.  Requests new one time orders be sent in by Dr. Modesta Messing to his [facility] in [place].

## 2019-02-01 NOTE — Telephone Encounter (Signed)
ordered

## 2019-02-01 NOTE — Telephone Encounter (Signed)
Medication refills - Telephone call with patient to inform Dr. Modesta Messing had sent in his requested one time refills to his [facility] in [place].  Patient to keep appointment on 02/08/2019.

## 2019-02-02 NOTE — Progress Notes (Deleted)
[place] MD/PA/NP OP Progress Note  02/02/2019 2:59 PM Garrett WEISENSEL  MRN:  [id number]  Chief Complaint:  HPI: *** Visit Diagnosis: No diagnosis found.  Past Psychiatric History: Please see initial evaluation for full details. I have reviewed the history. No updates at this time.     Past Medical History:  Past Medical History:  Diagnosis Date  . Chronic lower back pain   . Collagenous colitis [address] [place]  . COPD (chronic obstructive pulmonary disease) ([place])   . Essential hypertension   . Hyperlipidemia   . MRSA infection   . PUD (peptic ulcer disease)     Past Surgical History:  Procedure Laterality Date  . BACK SURGERY    . BIOPSY  06/03/2017   Procedure: BIOPSY;  Surgeon: Danie Binder, MD;  [facility];  Service: Endoscopy;;  colon  . COLONOSCOPY  2001   Dr. Sharlett Iles, normal  . COLONOSCOPY  04/2013   [facility]: Fentanyl 200mg Danford Bad 10mg : sigmoid diverticulosis, normal terminal ileum, small internal hemorrhoids, random colon bx: collagenous colitis. next tcs 04/2018  . COLONOSCOPY  2011   [facility]: two polyps, sessile serrated adenoma, mild diverticulosis  . COLONOSCOPY WITH PROPOFOL N/A 06/03/2017   Dr. Oneida Alar: Diverticulosis, three 2 to 3 mm polyps removed from the mid transverse colon and cecum, three 4 to 6 mm polyps in the rectum and mid descending colon removed.  External/internal hemorrhoids.  hyperplastic polyps.  Random colon biopsies negative for microscopic colitis. next colonoscopy in 5 years.  . ELBOW BURSA SURGERY    . ESOPHAGEAL DILATION N/A 05/19/2015   Procedure: ESOPHAGEAL DILATION;  Surgeon: Danie Binder, MD;  [facility];  Service: Endoscopy;  Laterality: N/A;  . ESOPHAGOGASTRODUODENOSCOPY N/A 10/28/2014   SLF: 1. Esophagitis due to GERD. KOH neg. 2. Small hiatal hernia 3. Moderate erosive gastritis 4. RUQ pain due to large ulcer 5. [place] duodentis in the bulb.   . ESOPHAGOGASTRODUODENOSCOPY N/A 05/19/2015   SLF: 1. Mild distal  esophagitis 2. Peptic stricture at the gastroesophageal junction 3. Mild non-erosive gastritis 4. Pseudo pylorus due to prior PUD.   Marland Kitchen ESOPHAGOGASTRODUODENOSCOPY N/A 03/07/2017   Dr. Oneida Alar: Benign-appearing esophageal stenosis status post dilation, mild gastritis  . FINGER AMPUTATION Left 1985   index finger  . HYDROCELE EXCISION Right 10/02/2018   Procedure: HYDROCELECTOMY ADULT;  Surgeon: Irine Seal, MD;  Location: [facility];  Service: Urology;  Laterality: Right;  . JOINT REPLACEMENT     left hip surgery - January 26, 2014  . POLYPECTOMY  06/03/2017   Procedure: POLYPECTOMY;  Surgeon: Danie Binder, MD;  [facility];  Service: Endoscopy;;  colon  . ROTATOR CUFF REPAIR     November 2014  . SAVORY DILATION N/A 03/07/2017   Procedure: SAVORY DILATION;  Surgeon: Danie Binder, MD;  [facility];  Service: Endoscopy;  Laterality: N/A;    Family Psychiatric History: Please see initial evaluation for full details. I have reviewed the history. No updates at this time.     Family History:  Family History  Problem Relation Age of Onset  . Cancer Mother        ovarian  . Diabetes type II Father   . Heart attack Father   . Arthritis Sister   . Stroke Brother   . Coronary artery disease Brother   . Diabetes Other   . Heart attack Unknown   . Colon cancer Neg Hx   . Liver disease  Neg Hx   . Ulcers Neg Hx     Social History:  Social History   Socioeconomic History  . Marital status: Widowed    Spouse name: Not on file  . Number of children: Not on file  . Years of education: Not on file  . Highest education level: Not on file  Occupational History  . Not on file  Social Needs  . Financial resource strain: Not on file  . Food insecurity:    Worry: Not on file    Inability: Not on file  . Transportation needs:    Medical: Not on file    Non-medical: Not on file  Tobacco Use  . Smoking status: Current Every Day Smoker    Packs/day: 0.50    Years: 40.00     Pack years: 20.00    Types: Cigarettes    Start date: 01/09/1969  . Smokeless tobacco: Never Used  Substance and Sexual Activity  . Alcohol use: No    Alcohol/week: 0.0 standard drinks  . Drug use: No  . Sexual activity: Not on file  Lifestyle  . Physical activity:    Days per week: Not on file    Minutes per session: Not on file  . Stress: Not on file  Relationships  . Social connections:    Talks on phone: Not on file    Gets together: Not on file    Attends religious service: Not on file    Active member of club or organization: Not on file    Attends meetings of clubs or organizations: Not on file    Relationship status: Not on file  Other Topics Concern  . Not on file  Social History Narrative  . Not on file    Allergies:  Allergies  Allergen Reactions  . Bee Venom Swelling  . Ciprofloxacin Other (See Comments)    Turns red from the chest up.  Mack Hook [Levofloxacin In D5w] Other (See Comments)    CANDIDA ALL OVER MOUTH TO PENIS  . Tramadol Hives       . Hydrocodone Rash  . Sulfa Antibiotics Rash    Metabolic Disorder Labs: No results found for: HGBA1C, MPG No results found for: PROLACTIN No results found for: CHOL, TRIG, HDL, CHOLHDL, VLDL, LDLCALC Lab Results  Component Value Date   TSH 1.27 09/15/2018   TSH 0.829 08/18/2015    Therapeutic Level Labs: No results found for: LITHIUM No results found for: VALPROATE No components found for:  CBMZ  Current Medications: Current Outpatient Medications  Medication Sig Dispense Refill  . albuterol (PROVENTIL HFA;VENTOLIN HFA) 108 (90 BASE) MCG/ACT inhaler Inhale 2 puffs into the lungs every 6 (six) hours as needed for wheezing or shortness of breath.    Marland Kitchen albuterol (PROVENTIL) (2.5 MG/3ML) 0.083% nebulizer solution Take 2.5 mg by nebulization every 6 (six) hours as needed for wheezing or shortness of breath.    . ALPRAZolam (XANAX) 0.5 MG tablet Take 1 tablet (0.5 mg total) by mouth 2 (two) times  daily as needed for anxiety or sleep. 60 tablet 0  . atorvastatin (LIPITOR) 80 MG tablet Take 40 mg by mouth every evening.     . budesonide-formoterol (SYMBICORT) 160-4.5 MCG/ACT inhaler Inhale 2 puffs into the lungs 2 (two) times daily.    Marland Kitchen EPINEPHrine 0.3 mg/0.3 mL IJ SOAJ injection Inject 0.3 mg into the muscle once.     Marland Kitchen HYDROmorphone (DILAUDID) 2 MG tablet Take 1 tablet (2 mg total) by mouth every 4 (  four) hours as needed for severe pain. 8 tablet 0  . lisinopril-hydrochlorothiazide (PRINZIDE,ZESTORETIC) 10-12.5 MG tablet Take 1 tablet by mouth daily.    . nortriptyline (PAMELOR) 50 MG capsule Take 1 capsule (50 mg total) by mouth at bedtime. 30 capsule 0  . omeprazole (PRILOSEC) 20 MG capsule 1 PO 30 MINS BEFORE YOUR FIRST MEAL EVERY DAY FOREVER (Patient taking differently: Take 20 mg by mouth daily as needed (for acid reflux). ) 30 capsule 11  . umeclidinium bromide (INCRUSE ELLIPTA) 62.5 MCG/INH AEPB Inhale 1 puff into the lungs every morning.      No current facility-administered medications for this visit.      Musculoskeletal: Strength & Muscle Tone: within normal limits Gait & Station: normal Patient leans: N/A  Psychiatric Specialty Exam: ROS  There were no vitals taken for this visit.There is no height or weight on file to calculate BMI.  General Appearance: Fairly Groomed  Eye Contact:  Good  Speech:  Clear and Coherent  Volume:  Normal  Mood:  {BHH MOOD:22306}  Affect:  {Affect (PAA):22687}  Thought Process:  Coherent  Orientation:  Full (Time, Place, and Person)  Thought Content: Logical   Suicidal Thoughts:  {ST/HT (PAA):22692}  Homicidal Thoughts:  {ST/HT (PAA):22692}  Memory:  Immediate;   Good  Judgement:  {Judgement (PAA):22694}  Insight:  {Insight (PAA):22695}  Psychomotor Activity:  Normal  Concentration:  Concentration: Good and Attention Span: Good  Recall:  Good  Fund of Knowledge: Good  Language: Good  Akathisia:  No  Handed:  Right  AIMS (if  indicated): not done  Assets:  Communication Skills Desire for Improvement  ADL's:  Intact  Cognition: WNL  Sleep:  {BHH GOOD/FAIR/POOR:22877}   Screenings:   Assessment and Plan:  Garrett Klein is a 63 y.o. year old male with a history of depression, hypertension,history of hyperlipidemia,collagenous colitis , who presents for follow up appointment for No diagnosis found.  # MDD, single episode without psychotic features  He reports depressive symptoms despite up titration of sertraline.  Psychosocial stressors including loss of his wife from pancreatic cancer.  Will switch from sertraline to nortriptyline especially given his chronic diarrhea, which may have started since starting sertraline.  Discussed potential risk of serotonin syndrome.  Discussed potential side effect of tachycardia, drowsiness.  He has no known history of cardiac disease.  Will continue Xanax as needed for anxiety.  Discussed risk of dependence and oversedation(Patient did have accidental overdose in 09/2017 with xanax and opioid).  Validated his grief.  Discussed behavioral activation.   Plan  1.Decrease sertraline 100 mg daily for one week, then 50 mg daily for one week, then discontinue 2. Start nortriptyline 25 mg at night for one week, then 50 mg at night  3. Continue Xanax 0.5 mg twice a day as needed for anxiety 4. Return to clinic in two months for 30 mins  Past trials of medication:lexapro,mirtazapine (drowsiness),venlafaxine, xanax,  The patient demonstrates the following risk factors for suicide: Chronic risk factors for suicide include:psychiatric disorder ofdepression. Acute risk factorsfor suicide include: loss (financial, interpersonal, professional). Protective factorsfor this patient include: positive social support, coping skills and hope for the future. Considering these factors, the overall suicide risk at this point appears to below. Patientisappropriate for outpatient  follow up.  [name], MD 02/02/2019, 2:59 PM

## 2019-02-08 ENCOUNTER — Ambulatory Visit (HOSPITAL_COMMUNITY): Payer: Medicare Other | Admitting: Psychiatry

## 2019-02-15 NOTE — Progress Notes (Signed)
Virtual Visit via Telephone Note  I connected with Bill Salinas on 02/23/19 at  9:30 AM EDT by telephone and verified that I am speaking with the correct person using two identifiers.   I discussed the limitations, risks, security and privacy concerns of performing an evaluation and management service by telephone and the availability of in person appointments. I also discussed with the patient that there may be a patient responsible charge related to this service. The patient expressed understanding and agreed to proceed.    I discussed the assessment and treatment plan with the patient. The patient was provided an opportunity to ask questions and all were answered. The patient agreed with the plan and demonstrated an understanding of the instructions.   The patient was advised to call back or seek an in-person evaluation if the symptoms worsen or if the condition fails to improve as anticipated.  I provided 30 minutes of non-face-to-face time during this encounter.   [name], MD   [facility] MD/PA/NP OP Progress Note  02/23/2019 10:02 AM BASIR NIVEN  MRN:  [id number]  Chief Complaint:  Chief Complaint    Depression; Follow-up     HPI:  This is a follow-up visit for depression.  He states that he had a panic attack when he attended group therapy for grief.  He was asked to share a memory about his wife.  He had intense anxiety, and started screaming.  Although he has been feeling fine until that day, he has been feeling more stressed lately.  He also reports continued chronic diarrhea, which has been unchanged since cross tapering from sertraline to nortriptyline.  He wonders if he ever feels better.  He has been talking with people who he met at the church.  He has insomnia, although he is slightly found that helpful to take nortriptyline.  He feels depressed.  Has fair concentration.  He has fair appetite.  He denies SI.  He occasionally feels anxious and tense.   Remember good about  his wife. Started screaming,  Xanax filled on 02/01/2019   Visit Diagnosis:    ICD-10-CM   1. Current moderate episode of major depressive disorder without prior episode ([place]) F32.1     Past Psychiatric History: Please see initial evaluation for full details. I have reviewed the history. No updates at this time.     Past Medical History:  Past Medical History:  Diagnosis Date  . Chronic lower back pain   . Collagenous colitis [address] [place]  . COPD (chronic obstructive pulmonary disease) ([place])   . Essential hypertension   . Hyperlipidemia   . MRSA infection   . PUD (peptic ulcer disease)     Past Surgical History:  Procedure Laterality Date  . BACK SURGERY    . BIOPSY  06/03/2017   Procedure: BIOPSY;  Surgeon: Danie Binder, MD;  [facility];  Service: Endoscopy;;  colon  . COLONOSCOPY  2001   Dr. Sharlett Iles, normal  . COLONOSCOPY  04/2013   [facility]: Fentanyl 244m/versed 163m sigmoid diverticulosis, normal terminal ileum, small internal hemorrhoids, random colon bx: collagenous colitis. next tcs 04/2018  . [facility], sess[facility] serr[facility] adenoma, mild diverticulosis  . COLONOSCOPY WITH PROPOFOL N/A 06/03/2017   Dr. fiOneida AlarDiverticulosis, three 2 to 3 mm polyps removed from the mid transverse colon and cecum, three 4 to 6 mm polyps in the rectum and mid descending colon removed.  External/internal hemorrhoids.  hyperplastic polyps.  Random colon biopsies negative for microscopic colitis. next colonoscopy in 5 years.  . ELBOW BURSA SURGERY    . ESOPHAGEAL DILATION N/A 05/19/2015   Procedure: ESOPHAGEAL DILATION;  Surgeon: Danie Binder, MD;  [facility];  Service: Endoscopy;  Laterality: N/A;  . ESOPHAGOGASTRODUODENOSCOPY N/A 10/28/2014   [facility]: 1. Esophagitis due to GERD. KOH neg. 2. Small hiatal hernia 3. Moderate erosive gastritis 4. RUQ pain due to large ulcer 5. [place] duodentis in the bulb.   . ESOPHAGOGASTRODUODENOSCOPY  N/A 05/19/2015   SLF: 1. Mild distal esophagitis 2. Peptic stricture at the gastroesophageal junction 3. Mild non-erosive gastritis 4. Pseudo pylorus due to prior PUD.   Marland Kitchen ESOPHAGOGASTRODUODENOSCOPY N/A 03/07/2017   Dr. Oneida Alar: Benign-appearing esophageal stenosis status post dilation, mild gastritis  . FINGER AMPUTATION Left 1985   index finger  . HYDROCELE EXCISION Right 10/02/2018   Procedure: HYDROCELECTOMY ADULT;  Surgeon: Irine Seal, MD;  [facility]: [facility];  Service: Urology;  Laterality: Right;  . JOINT REPLACEMENT     left hip surgery - January 26, 2014  . POLYPECTOMY  06/03/2017   Procedure: POLYPECTOMY;  Surgeon: Danie Binder, MD;  [facility];  Service: Endoscopy;;  colon  . ROTATOR CUFF REPAIR     November 2014  . SAVORY DILATION N/A 03/07/2017   Procedure: SAVORY DILATION;  Surgeon: Danie Binder, MD;  [facility];  Service: Endoscopy;  Laterality: N/A;    Family Psychiatric History: Please see initial evaluation for full details. I have reviewed the history. No updates at this time.     Family History:  Family History  Problem Relation Age of Onset  . Cancer Mother        ovarian  . Diabetes type II Father   . Heart attack Father   . Arthritis Sister   . Stroke Brother   . Coronary artery disease Brother   . Diabetes Other   . Heart attack Unknown   . Colon cancer Neg Hx   . Liver disease Neg Hx   . Ulcers Neg Hx     Social History:  Social History   Socioeconomic History  . Marital status: Widowed    Spouse name: Not on file  . Number of children: Not on file  . Years of education: Not on file  . Highest education level: Not on file  Occupational History  . Not on file  Social Needs  . Financial resource strain: Not on file  . Food insecurity:    Worry: Not on file    Inability: Not on file  . Transportation needs:    Medical: Not on file    Non-medical: Not on file  Tobacco Use  . Smoking status: Current Every Day Smoker     Packs/day: 0.50    Years: 40.00    Pack years: 20.00    Types: Cigarettes    Start date: 01/09/1969  . Smokeless tobacco: Never Used  Substance and Sexual Activity  . Alcohol use: No    Alcohol/week: 0.0 standard drinks  . Drug use: No  . Sexual activity: Not on file  Lifestyle  . Physical activity:    Days per week: Not on file    Minutes per session: Not on file  . Stress: Not on file  Relationships  . Social connections:    Talks on phone: Not on file    Gets together: Not on file    Attends religious service: Not  on file    Active member of club or organization: Not on file    Attends meetings of clubs or organizations: Not on file    Relationship status: Not on file  Other Topics Concern  . Not on file  Social History Narrative  . Not on file    Allergies:  Allergies  Allergen Reactions  . Bee Venom Swelling  . Ciprofloxacin Other (See Comments)    Turns red from the chest up.  Mack Hook [Levofloxacin In D5w] Other (See Comments)    CANDIDA ALL OVER MOUTH TO PENIS  . Tramadol Hives       . Hydrocodone Rash  . Sulfa Antibiotics Rash    Metabolic Disorder Labs: No results found for: HGBA1C, MPG No results found for: PROLACTIN No results found for: CHOL, TRIG, HDL, CHOLHDL, VLDL, LDLCALC Lab Results  Component Value Date   TSH 1.27 09/15/2018   TSH 0.829 08/18/2015    Therapeutic Level Labs: No results found for: LITHIUM No results found for: VALPROATE No components found for:  CBMZ  Current Medications: Current Outpatient Medications  Medication Sig Dispense Refill  . albuterol (PROVENTIL HFA;VENTOLIN HFA) 108 (90 BASE) MCG/ACT inhaler Inhale 2 puffs into the lungs every 6 (six) hours as needed for wheezing or shortness of breath.    Marland Kitchen albuterol (PROVENTIL) (2.5 MG/3ML) 0.083% nebulizer solution Take 2.5 mg by nebulization every 6 (six) hours as needed for wheezing or shortness of breath.    Derrill Memo ON 03/02/2019] ALPRAZolam (XANAX) 0.5 MG  tablet Take 1 tablet (0.5 mg total) by mouth 2 (two) times daily as needed for anxiety or sleep. 60 tablet 0  . atorvastatin (LIPITOR) 80 MG tablet Take 40 mg by mouth every evening.     . budesonide-formoterol (SYMBICORT) 160-4.5 MCG/ACT inhaler Inhale 2 puffs into the lungs 2 (two) times daily.    Marland Kitchen EPINEPHrine 0.3 mg/0.3 mL IJ SOAJ injection Inject 0.3 mg into the muscle once.     Marland Kitchen HYDROmorphone (DILAUDID) 2 MG tablet Take 1 tablet (2 mg total) by mouth every 4 (four) hours as needed for severe pain. 8 tablet 0  . lisinopril-hydrochlorothiazide (PRINZIDE,ZESTORETIC) 10-12.5 MG tablet Take 1 tablet by mouth daily.    . nortriptyline (PAMELOR) 50 MG capsule Take 2 capsules (100 mg total) by mouth at bedtime. 60 capsule 0  . omeprazole (PRILOSEC) 20 MG capsule 1 PO 30 MINS BEFORE YOUR FIRST MEAL EVERY DAY FOREVER (Patient taking differently: Take 20 mg by mouth daily as needed (for acid reflux). ) 30 capsule 11  . umeclidinium bromide (INCRUSE ELLIPTA) 62.5 MCG/INH AEPB Inhale 1 puff into the lungs every morning.      No current facility-administered medications for this visit.      Musculoskeletal: Strength & Muscle Tone: N/A Gait & Station: N/A Patient leans: N/A  Psychiatric Specialty Exam: Review of Systems  Psychiatric/Behavioral: Positive for depression. Negative for hallucinations, memory loss, substance abuse and suicidal ideas. The patient is nervous/anxious and has insomnia.   All other systems reviewed and are negative.   There were no vitals taken for this visit.There is no height or weight on file to calculate BMI.  General Appearance: NA  Eye Contact:  NA  Speech:  Clear and Coherent  Volume:  Normal  Mood:  Depressed  Affect:  NA  Thought Process:  Coherent  Orientation:  Full (Time, Place, and Person)  Thought Content: Logical   Suicidal Thoughts:  No  Homicidal Thoughts:  No  Memory:  Immediate;   Good  Judgement:  Good  Insight:  Fair  Psychomotor Activity:   Normal  Concentration:  Concentration: Good and Attention Span: Good  Recall:  Good  Fund of Knowledge: Good  Language: Good  Akathisia:  No  Handed:  Right  AIMS (if indicated): not done  Assets:  Communication Skills Desire for Improvement  ADL's:  Intact  Cognition: WNL  Sleep:  Poor   Screenings:   Assessment and Plan:  SQUARE JOWETT is a 63 y.o. year old male with a history of depression,hypertension, history of hyperlipidemia,collagenous colitis , who presents for follow up appointment for Current moderate episode of major depressive disorder without prior episode ([place])  # MDD,  Moderate, single episode without psychotic features He continues to report depressive symptoms due to loss of his wife from pancreatic cancer.  He denies any side effect after starting nortriptyline; although he reports continued chronic diarrhea, he reports no change in his symptoms.  Will uptitrate nortriptyline to target depression and insomnia.  He has no known history of cardiac disease.  Will continue Xanax as needed for anxiety.  Discussed risk of dependence and oversedation. (Patient did have accidental overdose in 09/2017 with xanax and opioid).    Validated his grief.  Discussed behavioral activation.   Plan I have reviewed and updated plans as below 1.Increase nortriptyline 100 mg at night   2. Continue Xanax 0.5 mg twice a day as needed for anxiety 3. Return to clinic in one month, May 5 th at 9:30 for 30 mins  Past trials of medication:sertraline, lexapro,mirtazapine (drowsiness),venlafaxine, xanax,  The patient demonstrates the following risk factors for suicide: Chronic risk factors for suicide include:psychiatric disorder ofdepression. Acute risk factorsfor suicide include: loss (financial, interpersonal, professional). Protective factorsfor this patient include: positive social support, coping skills and hope for the future. Considering these factors, the overall suicide  risk at this point appears to below. Patientisappropriate for outpatient follow up.  [name], MD 02/23/2019, 10:02 AM

## 2019-02-22 ENCOUNTER — Ambulatory Visit (HOSPITAL_COMMUNITY): Payer: Medicare Other | Admitting: Psychiatry

## 2019-02-23 ENCOUNTER — Encounter (HOSPITAL_COMMUNITY): Payer: Self-pay | Admitting: Psychiatry

## 2019-02-23 ENCOUNTER — Other Ambulatory Visit: Payer: Self-pay

## 2019-02-23 ENCOUNTER — Telehealth (HOSPITAL_COMMUNITY): Payer: Self-pay | Admitting: *Deleted

## 2019-02-23 ENCOUNTER — Ambulatory Visit (INDEPENDENT_AMBULATORY_CARE_PROVIDER_SITE_OTHER): Payer: Medicare Other | Admitting: Psychiatry

## 2019-02-23 DIAGNOSIS — F321 Major depressive disorder, single episode, moderate: Secondary | ICD-10-CM

## 2019-02-23 MED ORDER — ALPRAZOLAM 0.5 MG PO TABS: 0.5000 mg | ORAL_TABLET | Freq: Two times a day (BID) | ORAL | 0 refills | Status: DC | PRN

## 2019-02-23 MED ORDER — NORTRIPTYLINE HCL 50 MG PO CAPS: 100.0000 mg | ORAL_CAPSULE | Freq: Every day | ORAL | 0 refills | Status: DC

## 2019-02-23 NOTE — Telephone Encounter (Signed)
Dr Modesta Messing Patient called requesting refill on the Xanax & Pamelor(still having Diarrhea"terribly" Hasn't slowed down @ all .  Goes @ least 20 times daily)

## 2019-02-23 NOTE — Patient Instructions (Addendum)
1.Increase nortriptyline 100 mg at night   2. Continue Xanax 0.5 mg twice a day as needed for anxiety 3. Return to clinic on May 5 th at 9:30 for 30 mins

## 2019-02-23 NOTE — Telephone Encounter (Signed)
He has an appointment with me this morning. Will discuss at the visit.
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
## 2019-03-23 ENCOUNTER — Ambulatory Visit (INDEPENDENT_AMBULATORY_CARE_PROVIDER_SITE_OTHER): Payer: Medicare Other | Admitting: Psychiatry

## 2019-03-23 ENCOUNTER — Other Ambulatory Visit: Payer: Self-pay

## 2019-03-23 ENCOUNTER — Encounter (HOSPITAL_COMMUNITY): Payer: Self-pay | Admitting: Psychiatry

## 2019-03-23 DIAGNOSIS — F320 Major depressive disorder, single episode, mild: Secondary | ICD-10-CM

## 2019-03-23 MED ORDER — ALPRAZOLAM 0.5 MG PO TABS: 0.5000 mg | ORAL_TABLET | Freq: Two times a day (BID) | ORAL | 2 refills | Status: DC | PRN

## 2019-03-23 MED ORDER — NORTRIPTYLINE HCL 50 MG PO CAPS: 100.0000 mg | ORAL_CAPSULE | Freq: Every day | ORAL | 0 refills | Status: DC

## 2019-03-23 NOTE — Patient Instructions (Signed)
1.Continue nortriptyline 100 mg at night   2. Continue Xanax 0.5 mg twice a day as needed for anxiety 3. Next appointment: 7/27 10 AM

## 2019-03-24 ENCOUNTER — Ambulatory Visit (INDEPENDENT_AMBULATORY_CARE_PROVIDER_SITE_OTHER): Payer: Medicare Other | Admitting: Gastroenterology

## 2019-03-24 ENCOUNTER — Encounter: Payer: Self-pay | Admitting: Gastroenterology

## 2019-03-24 DIAGNOSIS — K529 Noninfective gastroenteritis and colitis, unspecified: Secondary | ICD-10-CM

## 2019-03-24 DIAGNOSIS — R1310 Dysphagia, unspecified: Secondary | ICD-10-CM | POA: Diagnosis not present

## 2019-03-24 DIAGNOSIS — R1319 Other dysphagia: Secondary | ICD-10-CM

## 2019-03-24 NOTE — Patient Instructions (Addendum)
CONTINUE WITH MEAL SERVICE IF POSSIBLE. FOLLOW A SOFT MECHANICAL DIET WHEN YOU CAN.  MEATS SHOULD BE GROUND ONLY. DO NOT EAT CHUNKS OF ANYTHING.  AVOID REFLUX TRIGGERS. SEE INFO BELOW.   RE-START OMEPRAZOLE.  TAKE 30 MINUTES PRIOR TO YOUR FIRST MEAL.   PLEASE CALL IN ONE MONTH IF SYMPTOMS ARE NOT IMPROVED.   FOLLOW UP IN 6 MOS.  Lifestyle and home remedies TO CONTROL REFLUX/REGURGITATION  You may eliminate or reduce the frequency of heartburn by making the following lifestyle changes:  . Control your weight. Being overweight is a major risk factor for heartburn and GERD. Excess pounds put pressure on your abdomen, pushing up your stomach and causing acid to back up into your esophagus.   . Eat smaller meals. 4 TO 6 MEALS A DAY. This reduces pressure on the lower esophageal sphincter, helping to prevent the valve from opening and acid from washing back into your esophagus.   Dolphus Jenny your belt. Clothes that fit tightly around your waist put pressure on your abdomen and the lower esophageal sphincter.   . Eliminate heartburn triggers. Everyone has specific triggers. Common triggers such as fatty or fried foods, spicy food, tomato sauce, carbonated beverages, alcohol, chocolate, mint, garlic, onion, caffeine and nicotine may make heartburn worse.   Marland Kitchen Avoid stooping or bending. Tying your shoes is OK. Bending over for longer periods to weed your garden isn't, especially soon after eating.   . Don't lie down after a meal. Wait at least three to four hours after eating before going to bed, and don't lie down right after eating.   Alternative medicine . Several home remedies exist for treating GERD, but they provide only temporary relief. They include drinking baking soda (sodium bicarbonate) added to water or drinking other fluids such as baking soda mixed with cream of tartar and water. . Although these liquids create temporary relief by neutralizing, washing away or buffering acids,  eventually they aggravate the situation by adding gas and fluid to your stomach, increasing pressure and causing more acid reflux. Further, adding more sodium to your diet may increase your blood pressure and add stress to your heart, and excessive bicarbonate ingestion can alter the acid-base balance in your body.

## 2019-03-24 NOTE — Assessment & Plan Note (Signed)
SYMPTOMS CONTROLLED/RESOLVED.  CONTINUE TO MONITOR SYMPTOMS. CONTINUE WITH MEAL SERVICE IF POSSIBLE.  FOLLOW UP IN 6 MOS.

## 2019-03-24 NOTE — Assessment & Plan Note (Addendum)
SYMPTOMS FAIRLY WELL CONTROLLED ON PRN PPI AND MAY BE DUE TO UNCONTROLLED ACID REFLUX.  CONTINUE WITH MEAL SERVICE IF POSSIBLE. FOLLOW A SOFT MECHANICAL DIET WHEN POSSIBLE. AVOID REFLUX TRIGGERS.  HANDOUT GIVEN. RE-START OMEPRAZOLE.  TAKE 30 MINUTES PRIOR TO YOUR FIRST MEAL. PLEASE CALL IN ONE MONTH IF SYMPTOMS ARE NOT IMPROVED.  FOLLOW UP IN 6 MOS.

## 2019-03-24 NOTE — Progress Notes (Signed)
Subjective:    Patient ID: Garrett Klein, male    DOB: 08/22/1956, 63 y.o.   MRN: [id number]   Primary Care Physician:  [name], MD  Primary GI:  Barney Drain, MD   Patient Location: home   Provider Location: [facility] office   Reason for Visit: DIARRHEA/DYSPHAGIA   Persons present on the virtual encounter, with roles: patient, myself (provider), MARTINA BOOTH CMA (update meds/allergies)   Total time (minutes) spent on medical discussion:  13 MINUTES   Due to COVID-19, visit was VIA TELEPHONE VISIT DUE TO COVID 19. VISIT IS CONDUCTED VIRTUALLY AND WAS REQUESTED BY PATIENT.   Virtual Visit via TELEPHONE   I connected with Garrett Klein and verified that I am speaking with the correct person using two identifiers.   I discussed the limitations, risks, security and privacy concerns of performing an evaluation and management service by telephone/video and the availability of in person appointments. I also discussed with the patient that there may be a patient responsible charge related to this service. The patient expressed understanding and agreed to proceed.  HPI WAS HAVING CHRONIC DIARRHEA AND TALKED TO [employer]. GOT MEALS ON WHEELS. HATES COOKING AND BEEN EATING OUT OF A CAN: BRUNSWICK STEW, SPAGHETTI O's, BEENIE WEENIES.  AFTER STARTED EATING MORE REGULAR STOOL GOT BETTER. WAS HAVING TROUBLE BUT DIARRHEA HAS CLEARED UP. STARTING TO CHOKE ONCE AGAIN: 2-3X/WEEK(SOLIDS AND THEN IT STARTS AND TRIES TO [place] IT DOWN AND THEN IT MAKES IT WORSE. TRIES INDUCE VOMITING. STILL OMEPRAZOLE AS NEEDED FOR PAST YEAR. HEARTBURN SEEMS TO BE CONTROLLED. NO ETOH. NO ASPIRIN, BC/GOODY POWDERS, IBUPROFEN/MOTRIN, OR NAPROXEN/ALEVE. BMs: 2X/DAY-FORMED. WAS HAVING PAIN ON LEFT SIDE BUT NOW IT'S BETTER THAT STOOL IS BETTER. LAST TIME 2-3 WEEKS.  PT DENIES FEVER, CHILLS, HEMATOCHEZIA, HEMATEMESIS, nausea, vomiting, melena, diarrhea, CHEST PAIN, SHORTNESS OF BREATH,  CHANGE IN BOWEL IN HABITS, constipation, abdominal  pain, problems swallowing, problems with sedation, OR heartburn or indigestion.  Past Medical History:  Diagnosis Date  . Chronic lower back pain   . Collagenous colitis [address] [place]  . COPD (chronic obstructive pulmonary disease) ([place])   . Essential hypertension   . Hyperlipidemia   . MRSA infection   . PUD (peptic ulcer disease)    Past Surgical History:  Procedure Laterality Date  . BACK SURGERY    . BIOPSY  06/03/2017   Procedure: BIOPSY;  Surgeon: Danie Binder, MD;  [facility];  Service: Endoscopy;;  colon  . COLONOSCOPY  2001   Dr. Sharlett Iles, normal  . COLONOSCOPY  04/2013   [facility]: Fentanyl 200mg Danford Bad 10mg : sigmoid diverticulosis, normal terminal ileum, small internal hemorrhoids, random colon bx: collagenous colitis. next tcs 04/2018  . COLONOSCOPY  2011   [facility]: two polyps, sessile serrated adenoma, mild diverticulosis  . COLONOSCOPY WITH PROPOFOL N/A 06/03/2017   Dr. Oneida Alar: Diverticulosis, three 2 to 3 mm polyps removed from the mid transverse colon and cecum, three 4 to 6 mm polyps in the rectum and mid descending colon removed.  External/internal hemorrhoids.  hyperplastic polyps.  Random colon biopsies negative for microscopic colitis. next colonoscopy in 5 years.  . ELBOW BURSA SURGERY    . ESOPHAGEAL DILATION N/A 05/19/2015   Procedure: ESOPHAGEAL DILATION;  Surgeon: Danie Binder, MD;  [facility];  Service: Endoscopy;  Laterality: N/A;  . ESOPHAGOGASTRODUODENOSCOPY N/A 10/28/2014   SLF: 1. Esophagitis due to GERD. KOH neg. 2. Small hiatal hernia 3. Moderate erosive gastritis 4. RUQ  pain due to large ulcer 5. [place] duodentis in the bulb.   . ESOPHAGOGASTRODUODENOSCOPY N/A 05/19/2015   SLF: 1. Mild distal esophagitis 2. Peptic stricture at the gastroesophageal junction 3. Mild non-erosive gastritis 4. Pseudo pylorus due to prior PUD.   Marland Kitchen ESOPHAGOGASTRODUODENOSCOPY N/A 03/07/2017   Dr. Oneida Alar: Benign-appearing esophageal stenosis status  post dilation, mild gastritis  . FINGER AMPUTATION Left 1985   index finger  . HYDROCELE EXCISION Right 10/02/2018   Procedure: HYDROCELECTOMY ADULT;  Surgeon: Irine Seal, MD;  [facility];  Service: Urology;  Laterality: Right;  . JOINT REPLACEMENT     left hip surgery - January 26, 2014  . POLYPECTOMY  06/03/2017   Procedure: POLYPECTOMY;  Surgeon: Danie Binder, MD;  [facility];  Service: Endoscopy;;  colon  . ROTATOR CUFF REPAIR     November 2014  . SAVORY DILATION N/A 03/07/2017   Procedure: SAVORY DILATION;  Surgeon: Danie Binder, MD;  [facility];  Service: Endoscopy;  Laterality: N/A;   Allergies  Allergen Reactions  . Bee Venom Swelling  . Ciprofloxacin Other (See Comments)    Turns red from the chest up.  Mack Hook [Levofloxacin In D5w] Other (See Comments)    CANDIDA ALL OVER MOUTH TO PENIS  . Tramadol Hives       . Hydrocodone Rash  . Sulfa Antibiotics Rash   Current Outpatient Medications  Medication Sig    . albuterol (PROVENTIL HFA;VENTOLIN HFA) 108 (90 BASE) MCG/ACT inhaler Inhale 2 puffs into the lungs every 6 (six) hours as needed for wheezing or shortness of breath.    Marland Kitchen albuterol (PROVENTIL) (2.5 MG/3ML) 0.083% nebulizer solution Take 2.5 mg by nebulization every 6 (six) hours as needed for wheezing or shortness of breath.    Derrill Memo ON 04/01/2019] ALPRAZolam (XANAX) 0.5 MG tablet Take 1 tablet (0.5 mg total) by mouth 2 (two) times daily as needed for anxiety or sleep.    Marland Kitchen atorvastatin (LIPITOR) 80 MG tablet Take 40 mg by mouth every evening.     . budesonide-formoterol (SYMBICORT) 160-4.5 MCG/ACT inhaler Inhale 2 puffs into the lungs 2 (two) times daily.    Marland Kitchen EPINEPHrine 0.3 mg/0.3 mL IJ SOAJ injection Inject 0.3 mg into the muscle once.     Marland Kitchen lisinopril-hydrochlorothiazide (PRINZIDE,ZESTORETIC) 10-12.5 MG tablet Take 1 tablet by mouth daily.    . nortriptyline 50 MG capsule Take 2 capsules (100 mg total) by mouth at bedtime.     Marland Kitchen omeprazole 20 MG capsule Take 20 mg by mouth daily as needed (for acid reflux). )    . umeclidinium bromide (INCRUSE ELLIPTA) 62.5 MCG/INH AEPB Inhale 1 puff into the lungs every morning.     .       Review of Systems PER HPI OTHERWISE ALL SYSTEMS ARE NEGATIVE.    Objective:   Physical Exam  TELEPHONE VISIT DUE TO COVID 19, VISIT IS CONDUCTED VIRTUALLY AND WAS REQUESTED BY PATIENT.    Assessment & Plan:

## 2019-03-25 NOTE — Progress Notes (Signed)
ON RECALL  °

## 2019-03-25 NOTE — Progress Notes (Signed)
CC'D TO PCP °

## 2019-04-30 ENCOUNTER — Encounter: Payer: Self-pay | Admitting: Gastroenterology

## 2019-06-07 NOTE — Progress Notes (Signed)
Virtual Visit via Telephone Note  I connected with Garrett Klein on 06/14/19 at 10:00 AM EDT by telephone and verified that I am speaking with the correct person using two identifiers.   I discussed the limitations, risks, security and privacy concerns of performing an evaluation and management service by telephone and the availability of in person appointments. I also discussed with the patient that there may be a patient responsible charge related to this service. The patient expressed understanding and agreed to proceed.      I discussed the assessment and treatment plan with the patient. The patient was provided an opportunity to ask questions and all were answered. The patient agreed with the plan and demonstrated an understanding of the instructions.   The patient was advised to call back or seek an in-person evaluation if the symptoms worsen or if the condition fails to improve as anticipated.  I provided 15 minutes of non-face-to-face time during this encounter.   [name], MD     [facility] MD/PA/NP OP Progress Note  06/14/2019 10:11 AM Garrett Klein  MRN:  [id number]  Chief Complaint:  Chief Complaint    Follow-up; Depression     HPI:  This is a follow-up appointment for depression.  He states that he has pain in his hip, which was "replaced itself" at one night; he will be seen by orthopedics in August.  He states that he has been contacting with a lady, who he used to know at age 31. He also talks with his granddaughter, age 62 and his brother. It has been "exciting time for me" to talk with them on the phone, although he has not been able to go outside due to pandemic.  He sleeps better after starting nortriptyline.  He feels less depressed. He has good appetite.  He has good concentration. He denies SI. He feels anxious, tense; feels "jerk inside of me." He takes xanax up to twice a day. He denies panic attacks.   180.3 lbs Wt Readings from Last 3 Encounters:  12/07/18  170 lb (77.1 kg)  09/15/18 166 lb (75.3 kg)  08/13/18 168 lb 6.4 oz (76.4 kg)     Visit Diagnosis:    ICD-10-CM   1. Current mild episode of major depressive disorder without prior episode ([place])  F32.0     Past Psychiatric History: Please see initial evaluation for full details. I have reviewed the history. No updates at this time.     Past Medical History:  Past Medical History:  Diagnosis Date  . Chronic lower back pain   . Collagenous colitis [address] [place]  . COPD (chronic obstructive pulmonary disease) ([place])   . Essential hypertension   . Hyperlipidemia   . MRSA infection   . PUD (peptic ulcer disease)     Past Surgical History:  Procedure Laterality Date  . BACK SURGERY    . BIOPSY  06/03/2017   Procedure: BIOPSY;  Surgeon: Danie Binder, MD;  [facility];  Service: Endoscopy;;  colon  . COLONOSCOPY  2001   Dr. Sharlett Iles, normal  . COLONOSCOPY  04/2013   [facility]: Fentanyl 200mg Danford Bad 10mg : sigmoid diverticulosis, normal terminal ileum, small internal hemorrhoids, random colon bx: collagenous colitis. next tcs 04/2018  . COLONOSCOPY  2011   [facility]: two polyps, sessile serrated adenoma, mild diverticulosis  . COLONOSCOPY WITH PROPOFOL N/A 06/03/2017   Dr. Oneida Alar: Diverticulosis, three 2 to 3 mm polyps removed from the mid transverse colon  and cecum, three 4 to 6 mm polyps in the rectum and mid descending colon removed.  External/internal hemorrhoids.  hyperplastic polyps.  Random colon biopsies negative for microscopic colitis. next colonoscopy in 5 years.  . ELBOW BURSA SURGERY    . ESOPHAGEAL DILATION N/A 05/19/2015   Procedure: ESOPHAGEAL DILATION;  Surgeon: Danie Binder, MD;  [facility];  Service: Endoscopy;  Laterality: N/A;  . ESOPHAGOGASTRODUODENOSCOPY N/A 10/28/2014   SLF: 1. Esophagitis due to GERD. KOH neg. 2. Small hiatal hernia 3. Moderate erosive gastritis 4. RUQ pain due to large ulcer 5. [place] duodentis in the bulb.   .  ESOPHAGOGASTRODUODENOSCOPY N/A 05/19/2015   SLF: 1. Mild distal esophagitis 2. Peptic stricture at the gastroesophageal junction 3. Mild non-erosive gastritis 4. Pseudo pylorus due to prior PUD.   Marland Kitchen ESOPHAGOGASTRODUODENOSCOPY N/A 03/07/2017   Dr. Oneida Alar: Benign-appearing esophageal stenosis status post dilation, mild gastritis  . FINGER AMPUTATION Left 1985   index finger  . HYDROCELE EXCISION Right 10/02/2018   Procedure: HYDROCELECTOMY ADULT;  Surgeon: Irine Seal, MD;  Location: [facility];  Service: Urology;  Laterality: Right;  . JOINT REPLACEMENT     left hip surgery - January 26, 2014  . POLYPECTOMY  06/03/2017   Procedure: POLYPECTOMY;  Surgeon: Danie Binder, MD;  [facility];  Service: Endoscopy;;  colon  . ROTATOR CUFF REPAIR     November 2014  . SAVORY DILATION N/A 03/07/2017   Procedure: SAVORY DILATION;  Surgeon: Danie Binder, MD;  [facility];  Service: Endoscopy;  Laterality: N/A;    Family Psychiatric History: Please see initial evaluation for full details. I have reviewed the history. No updates at this time.     Family History:  Family History  Problem Relation Age of Onset  . Cancer Mother        ovarian  . Diabetes type II Father   . Heart attack Father   . Arthritis Sister   . Stroke Brother   . Coronary artery disease Brother   . Diabetes Other   . Heart attack Unknown   . Colon cancer Neg Hx   . Liver disease Neg Hx   . Ulcers Neg Hx     Social History:  Social History   Socioeconomic History  . Marital status: Widowed    Spouse name: Not on file  . Number of children: Not on file  . Years of education: Not on file  . Highest education level: Not on file  Occupational History  . Not on file  Social Needs  . Financial resource strain: Not on file  . Food insecurity    Worry: Not on file    Inability: Not on file  . Transportation needs    Medical: Not on file    Non-medical: Not on file  Tobacco Use  . Smoking status:  Current Every Day Smoker    Packs/day: 0.50    Years: 40.00    Pack years: 20.00    Types: Cigarettes    Start date: 01/09/1969  . Smokeless tobacco: Never Used  Substance and Sexual Activity  . Alcohol use: No    Alcohol/week: 0.0 standard drinks  . Drug use: No  . Sexual activity: Not on file  Lifestyle  . Physical activity    Days per week: Not on file    Minutes per session: Not on file  . Stress: Not on file  Relationships  . Social connections    Talks  on phone: Not on file    Gets together: Not on file    Attends religious service: Not on file    Active member of club or organization: Not on file    Attends meetings of clubs or organizations: Not on file    Relationship status: Not on file  Other Topics Concern  . Not on file  Social History Narrative  . Not on file    Allergies:  Allergies  Allergen Reactions  . Bee Venom Swelling  . Ciprofloxacin Other (See Comments)    Turns red from the chest up.  [place] [Levofloxacin In D5w] Other (See Comments)    CANDIDA ALL OVER MOUTH TO PENIS  . Tramadol Hives       . Hydrocodone Rash  . Sulfa Antibiotics Rash    Metabolic Disorder Labs: No results found for: HGBA1C, MPG No results found for: PROLACTIN No results found for: CHOL, TRIG, HDL, CHOLHDL, VLDL, LDLCALC Lab Results  Component Value Date   TSH 1.27 09/15/2018   TSH 0.829 08/18/2015    Therapeutic Level Labs: No results found for: LITHIUM No results found for: VALPROATE No components found for:  CBMZ  Current Medications: Current Outpatient Medications  Medication Sig Dispense Refill  . albuterol (PROVENTIL HFA;VENTOLIN HFA) 108 (90 BASE) MCG/ACT inhaler Inhale 2 puffs into the lungs every 6 (six) hours as needed for wheezing or shortness of breath.    Marland Kitchen albuterol (PROVENTIL) (2.5 MG/3ML) 0.083% nebulizer solution Take 2.5 mg by nebulization every 6 (six) hours as needed for wheezing or shortness of breath.    . ALPRAZolam (XANAX) 0.5 MG  tablet Take 1 tablet (0.5 mg total) by mouth 2 (two) times daily as needed for anxiety or sleep. 60 tablet 2  . atorvastatin (LIPITOR) 80 MG tablet Take 40 mg by mouth every evening.     . budesonide-formoterol (SYMBICORT) 160-4.5 MCG/ACT inhaler Inhale 2 puffs into the lungs 2 (two) times daily.    Marland Kitchen EPINEPHrine 0.3 mg/0.3 mL IJ SOAJ injection Inject 0.3 mg into the muscle once.     Marland Kitchen HYDROmorphone (DILAUDID) 2 MG tablet Take 1 tablet (2 mg total) by mouth every 4 (four) hours as needed for severe pain. (Patient not taking: Reported on 03/24/2019) 8 tablet 0  . lisinopril-hydrochlorothiazide (PRINZIDE,ZESTORETIC) 10-12.5 MG tablet Take 1 tablet by mouth daily.    . nortriptyline (PAMELOR) 50 MG capsule Take 2 capsules (100 mg total) by mouth at bedtime. 180 capsule 0  . omeprazole (PRILOSEC) 20 MG capsule 1 PO 30 MINS BEFORE YOUR FIRST MEAL EVERY DAY FOREVER (Patient taking differently: Take 20 mg by mouth daily as needed (for acid reflux). ) 30 capsule 11  . umeclidinium bromide (INCRUSE ELLIPTA) 62.5 MCG/INH AEPB Inhale 1 puff into the lungs every morning.      No current facility-administered medications for this visit.      Musculoskeletal: Strength & Muscle Tone: N/A Gait & Station: N/A Patient leans: N/A  Psychiatric Specialty Exam: Review of Systems  Psychiatric/Behavioral: Positive for depression. Negative for hallucinations, memory loss, substance abuse and suicidal ideas. The patient is nervous/anxious. The patient does not have insomnia.   All other systems reviewed and are negative.   There were no vitals taken for this visit.There is no height or weight on file to calculate BMI.  General Appearance: NA  Eye Contact:  NA  Speech:  Clear and Coherent  Volume:  Normal  Mood:  Anxious  Affect:  NA  Thought Process:  Coherent  Orientation:  Full (Time, Place, and Person)  Thought Content: Logical   Suicidal Thoughts:  No  Homicidal Thoughts:  No  Memory:  Immediate;   Good   Judgement:  Fair  Insight:  Present  Psychomotor Activity:  Normal  Concentration:  Concentration: Good and Attention Span: Good  Recall:  Good  Fund of Knowledge: Good  Language: Good  Akathisia:  No  Handed:  Right  AIMS (if indicated): not done  Assets:  Communication Skills Desire for Improvement  ADL's:  Intact  Cognition: WNL  Sleep:  Good   Screenings:   Assessment and Plan:  Garrett Klein is a 63 y.o. year old male with a history of depression, hypertension ,history of hyperlipidemia,collagenous colitis, who presents for follow up appointment for depression.   # MDD, mild, single episode without psychotic features There has been steady improvement in depressive symptoms after up titration of nortriptyline.  Psychosocial stressors includes loss of his wife from pancreatic cancer. Will continue nortriptyline to target depression and insomnia.  He has no known history of cardiac disease, and he denies any side effect.  Will continue Xanax as needed for anxiety.  Discussed risk of dependence and oversedation. (Patient did have accidental overdose in 09/2017 with xanax and opioid).Discussed behavioral activation.   Plan I have reviewed and updated plans as below 1.Continue nortriptyline 100 mg at night  2.Continue Xanax 0.5 mg twice a day as needed for anxiety, 05/27/2019 3. Next appointment: 10/19 at 9:20 for 20 mins, phone  Past trials of medication:sertraline,lexapro,mirtazapine (drowsiness),venlafaxine, xanax,  The patient demonstrates the following risk factors for suicide: Chronic risk factors for suicide include:psychiatric disorder ofdepression. Acute risk factorsfor suicide include: loss (financial, interpersonal, professional). Protective factorsfor this patient include: positive social support, coping skills and hope for the future. Considering these factors, the overall suicide risk at   [name], MD 06/14/2019, 10:11 AM

## 2019-06-09 ENCOUNTER — Telehealth: Payer: Self-pay

## 2019-06-09 NOTE — Telephone Encounter (Signed)
Patient called requesting a refill on his Nortriptyline 50mg . He has an upcoming appointment on 06/14/19. Please review and advise. Thank you.

## 2019-06-09 NOTE — Telephone Encounter (Signed)
There is an order of 90 days on 5/5. He should have enough medication. Please verify it with pharmacy.

## 2019-06-14 ENCOUNTER — Other Ambulatory Visit: Payer: Self-pay

## 2019-06-14 ENCOUNTER — Ambulatory Visit (INDEPENDENT_AMBULATORY_CARE_PROVIDER_SITE_OTHER): Payer: Medicare Other | Admitting: Psychiatry

## 2019-06-14 ENCOUNTER — Encounter (HOSPITAL_COMMUNITY): Payer: Self-pay | Admitting: Psychiatry

## 2019-06-14 DIAGNOSIS — F320 Major depressive disorder, single episode, mild: Secondary | ICD-10-CM

## 2019-06-14 MED ORDER — ALPRAZOLAM 0.5 MG PO TABS: 0.5000 mg | ORAL_TABLET | Freq: Two times a day (BID) | ORAL | 2 refills | Status: DC | PRN

## 2019-06-14 MED ORDER — NORTRIPTYLINE HCL 50 MG PO CAPS: 100.0000 mg | ORAL_CAPSULE | Freq: Every day | ORAL | 0 refills | Status: DC

## 2019-06-14 NOTE — Patient Instructions (Signed)
1.Continue nortriptyline 100 mg at night  2.Continue Xanax 0.5 mg twice a day as needed for anxiety 3. Next appointment: 10/19 at 9:20

## 2019-06-17 ENCOUNTER — Telehealth: Payer: Self-pay | Admitting: *Deleted

## 2019-06-17 NOTE — Telephone Encounter (Signed)
Patient verbally consented for telehealth visits with [facility] and understands that his insurance company will be billed for the encounter.   Aware to have vitals available 

## 2019-06-21 ENCOUNTER — Ambulatory Visit: Payer: Medicare Other | Admitting: Cardiology

## 2019-06-22 ENCOUNTER — Encounter: Payer: Self-pay | Admitting: *Deleted

## 2019-06-22 ENCOUNTER — Telehealth (INDEPENDENT_AMBULATORY_CARE_PROVIDER_SITE_OTHER): Payer: Medicare Other | Admitting: Cardiology

## 2019-06-22 VITALS — BP 131/88 | Ht 70.0 in | Wt 178.6 lb

## 2019-06-22 DIAGNOSIS — I10 Essential (primary) hypertension: Secondary | ICD-10-CM

## 2019-06-22 DIAGNOSIS — E782 Mixed hyperlipidemia: Secondary | ICD-10-CM | POA: Diagnosis not present

## 2019-06-22 DIAGNOSIS — Z720 Tobacco use: Secondary | ICD-10-CM | POA: Diagnosis not present

## 2019-06-22 DIAGNOSIS — I2510 Atherosclerotic heart disease of native coronary artery without angina pectoris: Secondary | ICD-10-CM

## 2019-06-22 NOTE — Progress Notes (Signed)
Virtual Visit via Telephone Note   This visit type was conducted due to national recommendations for restrictions regarding the COVID-19 Pandemic (e.g. social distancing) in an effort to limit this patient's exposure and mitigate transmission in our community.  Due to his co-morbid illnesses, this patient is at least at moderate risk for complications without adequate follow up.  This format is felt to be most appropriate for this patient at this time.  The patient did not have access to video technology/had technical difficulties with video requiring transitioning to audio format only (telephone).  All issues noted in this document were discussed and addressed.  No physical exam could be performed with this format.  Please refer to the patient's chart for his  consent to telehealth for [facility].   Date:  06/22/2019   ID:  Garrett Klein, DOB 02-13-1956, MRN [id number]  Patient Location: Home Provider Location: Office  PCP:  [name], MD  Cardiologist:  Rozann Lesches, MD Electrophysiologist:  None   Evaluation Performed:  Follow-Up Visit  Chief Complaint:   Cardiac follow-up  History of Present Illness:    Garrett Klein is a 63 y.o. male last seen in May 2019.  We spoke by phone today.  He does not report any definitive angina symptoms.  He states that back in January he had an unusual sharp pain in his chest after twisting a certain way, lasted a few days, and has not recurred since then.  I reviewed his medications which are outlined below.  He reports compliance.  He states that sometimes he feels lightheaded when he stands up.  Today's blood pressure was normal.  No palpitations or syncope.  He is having some hip problems, has an orthopedic consultation to the [facility] later this month.  The patient does not have symptoms concerning for COVID-19 infection (fever, chills, cough, or new shortness of breath).  He states that he wears a mask when he goes out.   Past  Medical History:  Diagnosis Date  . Chronic lower back pain   . Collagenous colitis [address] [place]  . COPD (chronic obstructive pulmonary disease) ([place])   . Essential hypertension   . Hyperlipidemia   . MRSA infection   . PUD (peptic ulcer disease)    Past Surgical History:  Procedure Laterality Date  . BACK SURGERY    . BIOPSY  06/03/2017   Procedure: BIOPSY;  Surgeon: Danie Binder, MD;  [facility];  Service: Endoscopy;;  colon  . COLONOSCOPY  2001   Dr. Sharlett Iles, normal  . COLONOSCOPY  04/2013   [facility]: Fentanyl 200mg Danford Bad 10mg : sigmoid diverticulosis, normal terminal ileum, small internal hemorrhoids, random colon bx: collagenous colitis. next tcs 04/2018  . COLONOSCOPY  2011   [facility]: two polyps, sessile serrated adenoma, mild diverticulosis  . COLONOSCOPY WITH PROPOFOL N/A 06/03/2017   Dr. Oneida Alar: Diverticulosis, three 2 to 3 mm polyps removed from the mid transverse colon and cecum, three 4 to 6 mm polyps in the rectum and mid descending colon removed.  External/internal hemorrhoids.  hyperplastic polyps.  Random colon biopsies negative for microscopic colitis. next colonoscopy in 5 years.  . ELBOW BURSA SURGERY    . ESOPHAGEAL DILATION N/A 05/19/2015   Procedure: ESOPHAGEAL DILATION;  Surgeon: Danie Binder, MD;  [facility];  Service: Endoscopy;  Laterality: N/A;  . ESOPHAGOGASTRODUODENOSCOPY N/A 10/28/2014   SLF: 1. Esophagitis due to GERD. KOH neg. 2. Small hiatal hernia 3.  Moderate erosive gastritis 4. RUQ pain due to large ulcer 5. [place] duodentis in the bulb.   . ESOPHAGOGASTRODUODENOSCOPY N/A 05/19/2015   SLF: 1. Mild distal esophagitis 2. Peptic stricture at the gastroesophageal junction 3. Mild non-erosive gastritis 4. Pseudo pylorus due to prior PUD.   Marland Kitchen ESOPHAGOGASTRODUODENOSCOPY N/A 03/07/2017   Dr. Oneida Alar: Benign-appearing esophageal stenosis status post dilation, mild gastritis  . FINGER AMPUTATION Left 1985   index finger  .  HYDROCELE EXCISION Right 10/02/2018   Procedure: HYDROCELECTOMY ADULT;  Surgeon: Irine Seal, MD;  Location: [facility];  Service: Urology;  Laterality: Right;  . JOINT REPLACEMENT     left hip surgery - January 26, 2014  . POLYPECTOMY  06/03/2017   Procedure: POLYPECTOMY;  Surgeon: Danie Binder, MD;  [facility];  Service: Endoscopy;;  colon  . ROTATOR CUFF REPAIR     November 2014  . SAVORY DILATION N/A 03/07/2017   Procedure: SAVORY DILATION;  Surgeon: Danie Binder, MD;  [facility];  Service: Endoscopy;  Laterality: N/A;     Current Meds  Medication Sig  . albuterol (PROVENTIL HFA;VENTOLIN HFA) 108 (90 BASE) MCG/ACT inhaler Inhale 2 puffs into the lungs every 6 (six) hours as needed for wheezing or shortness of breath.  Marland Kitchen albuterol (PROVENTIL) (2.5 MG/3ML) 0.083% nebulizer solution Take 2.5 mg by nebulization every 6 (six) hours as needed for wheezing or shortness of breath.  Derrill Memo ON 06/25/2019] ALPRAZolam (XANAX) 0.5 MG tablet Take 1 tablet (0.5 mg total) by mouth 2 (two) times daily as needed for anxiety or sleep.  Marland Kitchen atorvastatin (LIPITOR) 80 MG tablet Take 40 mg by mouth every evening.   . budesonide-formoterol (SYMBICORT) 160-4.5 MCG/ACT inhaler Inhale 2 puffs into the lungs 2 (two) times daily.  Marland Kitchen EPINEPHrine 0.3 mg/0.3 mL IJ SOAJ injection Inject 0.3 mg into the muscle as needed (allergic reaction).   Marland Kitchen lisinopril-hydrochlorothiazide (PRINZIDE,ZESTORETIC) 10-12.5 MG tablet Take 1 tablet by mouth daily.  Derrill Memo ON 06/23/2019] nortriptyline (PAMELOR) 50 MG capsule Take 2 capsules (100 mg total) by mouth at bedtime.  Marland Kitchen omeprazole (PRILOSEC) 20 MG capsule 1 PO 30 MINS BEFORE YOUR FIRST MEAL EVERY DAY FOREVER (Patient taking differently: Take 20 mg by mouth daily as needed (for acid reflux). )  . umeclidinium bromide (INCRUSE ELLIPTA) 62.5 MCG/INH AEPB Inhale 1 puff into the lungs every morning.      Allergies:   Bee venom, Ciprofloxacin, Levaquin [levofloxacin  in d5w], Tramadol, Hydrocodone, and Sulfa antibiotics   Social History   Tobacco Use  . Smoking status: Current Every Day Smoker    Packs/day: 0.50    Years: 40.00    Pack years: 20.00    Types: Cigarettes    Start date: 01/09/1969  . Smokeless tobacco: Never Used  Substance Use Topics  . Alcohol use: No    Alcohol/week: 0.0 standard drinks  . Drug use: No     ROS:   Please see the history of present illness. All other systems reviewed and are negative.   Prior CV studies:   The following studies were reviewed today:  Lexiscan Myoview 08/08/2015([facility]): No evidence of scar or ischemia with LVEF 59%.  Chest CTA 10/09/2014: IMPRESSION: No evidence of acute abnormality. No evidence of pulmonary emboli or thoracic aortic aneurysm.  Echocardiogram 08/29/2016: Study Conclusions  - Left ventricle: The cavity size was normal. Wall thickness was increased in a pattern of mild LVH. Systolic function was normal. The estimated ejection fraction was in the  range of 60% to 65%. Wall motion was normal; there were no regional wall motion abnormalities. Doppler parameters are consistent with abnormal left ventricular relaxation (grade 1 diastolic dysfunction). - Aortic valve: Trileaflet; mildly calcified leaflets. There was trivial regurgitation. - Mitral valve: There was trivial regurgitation. - Right atrium: Central venous pressure (est): 3 mm Hg. - Tricuspid valve: There was trivial regurgitation. - Pulmonary arteries: Systolic pressure could not be accurately estimated. - Pericardium, extracardiac: There was no pericardial effusion.  Impressions:  - Mild LVH with LVEF 60-65%. Grade 1 diastolic dysfunction. Trivial mitral regurgitation. Mildly sclerotic aortic valve with trivial aortic regurgitation. Trivial tricuspid regurgitation.  Lower extremity arterial Dopplers 08/29/2016: Normal ABIs (right 1.3 and left 1.2) with no evidence of segmental  lower extremity arterial disease at rest.  Labs/Other Tests and Data Reviewed:    EKG:  An ECG dated 09/30/2017 was personally reviewed today and demonstrated:  Sinus rhythm with right bundle branch block.  Recent Labs: 08/13/2018: ALT 20; BUN 12; Creatinine, Ser 0.69; Hemoglobin 14.6; Platelets 310; Potassium 3.9; Sodium 139 09/15/2018: TSH 1.27    Wt Readings from Last 3 Encounters:  06/22/19 178 lb 9.6 oz (81 kg)  08/13/18 168 lb 6.4 oz (76.4 kg)  03/24/18 144 lb (65.3 kg)     Objective:    Vital Signs:  BP 131/88   Ht 5\' 10"  (1.778 m)   Wt 178 lb 9.6 oz (81 kg)   BMI 25.63 kg/m    Patient spoke in full sentences, not short of breath. No audible wheezing or coughing. Speech pattern normal.  ASSESSMENT & PLAN:    1.  Coronary artery calcification by CT imaging.  He reports no definite angina at this time.  I recommended he that he continue taking a baby aspirin a day along with statin therapy.  2.  Tobacco abuse.  Smoking cessation has been discussed over time.  He has not been able to quit so far.  3.  Essential hypertension on Prinzide.  He does describe some orthostatic dizziness.  I asked him to check in with his PCP Dr. Manuella Ghazi to see if any adjustments need to be made.  4.  Hyperlipidemia on Lipitor.  Follow-up with Dr. Brigitte Pulse for repeat lab work.  COVID-19 Education: The signs and symptoms of COVID-19 were discussed with the patient and how to seek care for testing (follow up with PCP or arrange E-visit).  The importance of social distancing was discussed today.  Time:   Today, I have spent 6 minutes with the patient with telehealth technology discussing the above problems.     Medication Adjustments/Labs and Tests Ordered: Current medicines are reviewed at length with the patient today.  Concerns regarding medicines are outlined above.   Tests Ordered: No orders of the defined types were placed in this encounter.   Medication Changes: No orders of the defined  types were placed in this encounter.   Follow Up:  In Person 1 year in the [place] office.  Signed, Rozann Lesches, MD  06/22/2019 10:15 AM    [place] [place]

## 2019-06-22 NOTE — Patient Instructions (Addendum)

## 2019-06-28 ENCOUNTER — Ambulatory Visit: Payer: Medicare Other | Admitting: Nurse Practitioner

## 2019-07-22 ENCOUNTER — Ambulatory Visit: Payer: Medicare Other | Admitting: Nurse Practitioner

## 2019-08-18 ENCOUNTER — Ambulatory Visit: Payer: Medicare Other | Admitting: Nurse Practitioner

## 2019-08-30 NOTE — Progress Notes (Signed)
Virtual Visit via Telephone Note  I connected with Garrett Klein on 09/06/19 at  9:20 AM EDT by telephone and verified that I am speaking with the correct person using two identifiers.   I discussed the limitations, risks, security and privacy concerns of performing an evaluation and management service by telephone and the availability of in person appointments. I also discussed with the patient that there may be a patient responsible charge related to this service. The patient expressed understanding and agreed to proceed.      I discussed the assessment and treatment plan with the patient. The patient was provided an opportunity to ask questions and all were answered. The patient agreed with the plan and demonstrated an understanding of the instructions.   The patient was advised to call back or seek an in-person evaluation if the symptoms worsen or if the condition fails to improve as anticipated.  I provided 15 minutes of non-face-to-face time during this encounter.   [name], MD    [facility] MD/PA/NP OP Progress Note  09/06/2019 9:42 AM Garrett Klein  MRN:  [id number]  Chief Complaint:  Chief Complaint    Depression; Follow-up     HPI:  This is a follow-up appointment for depression.  He states that he has been trying to take it one day a time. He feels more anxious and down due to upcoming holidays. The fall used to be a favorite time of the year of his deceased wife. He wants to "get through" with it and hopes to come off his medication (although he later agreed to stay on the medication after provided psychoeducation of maintenance therapy).  He has good support from his sister.  His brother had a stroke last month. His brother has dysarthria, although he has been doing better.  He has insomnia, which he attributes to hip pain.  He feels fatigue. He has fair concentration. He has mild anhedonia. He enjoys reading at times. He denies SI. He denies panic attacks. He has good  appetite. He feels good about his recent weight gain.    Wt Readings from Last 3 Encounters:  09/01/19 186 lb (84.4 kg)  06/22/19 178 lb 9.6 oz (81 kg)  12/07/18 170 lb (77.1 kg)    Visit Diagnosis:    ICD-10-CM   1. Current mild episode of major depressive disorder without prior episode ([place])  F32.0     Past Psychiatric History: Please see initial evaluation for full details. I have reviewed the history. No updates at this time.     Past Medical History:  Past Medical History:  Diagnosis Date  . Chronic lower back pain   . Collagenous colitis [address] [place]  . COPD (chronic obstructive pulmonary disease) ([place])   . Essential hypertension   . Hyperlipidemia   . MRSA infection   . PUD (peptic ulcer disease)     Past Surgical History:  Procedure Laterality Date  . BACK SURGERY    . BIOPSY  06/03/2017   Procedure: BIOPSY;  Surgeon: Danie Binder, MD;  [facility];  Service: Endoscopy;;  colon  . COLONOSCOPY  2001   Dr. Sharlett Iles, normal  . COLONOSCOPY  04/2013   [facility]: Fentanyl 200mg Danford Bad 10mg : sigmoid diverticulosis, normal terminal ileum, small internal hemorrhoids, random colon bx: collagenous colitis. next tcs 04/2018  . COLONOSCOPY  2011   [facility]: two polyps, sessile serrated adenoma, mild diverticulosis  . COLONOSCOPY WITH PROPOFOL N/A 06/03/2017   Dr. Oneida Alar:  Diverticulosis, three 2 to 3 mm polyps removed from the mid transverse colon and cecum, three 4 to 6 mm polyps in the rectum and mid descending colon removed.  External/internal hemorrhoids.  hyperplastic polyps.  Random colon biopsies negative for microscopic colitis. next colonoscopy in 5 years.  . ELBOW BURSA SURGERY    . ESOPHAGEAL DILATION N/A 05/19/2015   Procedure: ESOPHAGEAL DILATION;  Surgeon: Danie Binder, MD;  [facility];  Service: Endoscopy;  Laterality: N/A;  . ESOPHAGOGASTRODUODENOSCOPY N/A 10/28/2014   SLF: 1. Esophagitis due to GERD. KOH neg. 2. Small hiatal hernia  3. Moderate erosive gastritis 4. RUQ pain due to large ulcer 5. [place] duodentis in the bulb.   . ESOPHAGOGASTRODUODENOSCOPY N/A 05/19/2015   SLF: 1. Mild distal esophagitis 2. Peptic stricture at the gastroesophageal junction 3. Mild non-erosive gastritis 4. Pseudo pylorus due to prior PUD.   Marland Kitchen ESOPHAGOGASTRODUODENOSCOPY N/A 03/07/2017   Dr. Oneida Alar: Benign-appearing esophageal stenosis status post dilation, mild gastritis  . FINGER AMPUTATION Left 1985   index finger  . HYDROCELE EXCISION Right 10/02/2018   Procedure: HYDROCELECTOMY ADULT;  Surgeon: Irine Seal, MD;  Location: [facility];  Service: Urology;  Laterality: Right;  . JOINT REPLACEMENT     left hip surgery - January 26, 2014  . POLYPECTOMY  06/03/2017   Procedure: POLYPECTOMY;  Surgeon: Danie Binder, MD;  [facility];  Service: Endoscopy;;  colon  . ROTATOR CUFF REPAIR     November 2014  . SAVORY DILATION N/A 03/07/2017   Procedure: SAVORY DILATION;  Surgeon: Danie Binder, MD;  [facility];  Service: Endoscopy;  Laterality: N/A;    Family Psychiatric History: Please see initial evaluation for full details. I have reviewed the history. No updates at this time.     Family History:  Family History  Problem Relation Age of Onset  . Cancer Mother        ovarian  . Diabetes type II Father   . Heart attack Father   . Arthritis Sister   . Stroke Brother   . Coronary artery disease Brother   . Diabetes Other   . Heart attack Other   . Colon cancer Neg Hx   . Liver disease Neg Hx   . Ulcers Neg Hx     Social History:  Social History   Socioeconomic History  . Marital status: Widowed    Spouse name: Not on file  . Number of children: Not on file  . Years of education: Not on file  . Highest education level: Not on file  Occupational History  . Not on file  Social Needs  . Financial resource strain: Not on file  . Food insecurity    Worry: Not on file    Inability: Not on file  .  Transportation needs    Medical: Not on file    Non-medical: Not on file  Tobacco Use  . Smoking status: Current Every Day Smoker    Packs/day: 0.50    Years: 40.00    Pack years: 20.00    Types: Cigarettes    Start date: 01/09/1969  . Smokeless tobacco: Never Used  Substance and Sexual Activity  . Alcohol use: No    Alcohol/week: 0.0 standard drinks  . Drug use: No  . Sexual activity: Not on file  Lifestyle  . Physical activity    Days per week: Not on file    Minutes per session: Not on file  . Stress:  Not on file  Relationships  . Social Herbalist on phone: Not on file    Gets together: Not on file    Attends religious service: Not on file    Active member of club or organization: Not on file    Attends meetings of clubs or organizations: Not on file    Relationship status: Not on file  Other Topics Concern  . Not on file  Social History Narrative  . Not on file    Allergies:  Allergies  Allergen Reactions  . Bee Venom Swelling  . Ciprofloxacin Other (See Comments)    Turns red from the chest up.  Mack Hook [Levofloxacin In D5w] Other (See Comments)    CANDIDA ALL OVER MOUTH TO PENIS  . Tramadol Hives       . Hydrocodone Rash  . Sulfa Antibiotics Rash    Metabolic Disorder Labs: No results found for: HGBA1C, MPG No results found for: PROLACTIN No results found for: CHOL, TRIG, HDL, CHOLHDL, VLDL, LDLCALC Lab Results  Component Value Date   TSH 1.27 09/15/2018   TSH 0.829 08/18/2015    Therapeutic Level Labs: No results found for: LITHIUM No results found for: VALPROATE No components found for:  CBMZ  Current Medications: Current Outpatient Medications  Medication Sig Dispense Refill  . albuterol (PROVENTIL HFA;VENTOLIN HFA) 108 (90 BASE) MCG/ACT inhaler Inhale 2 puffs into the lungs every 6 (six) hours as needed for wheezing or shortness of breath.    Marland Kitchen albuterol (PROVENTIL) (2.5 MG/3ML) 0.083% nebulizer solution Take 2.5 mg by  nebulization every 6 (six) hours as needed for wheezing or shortness of breath.    Derrill Memo ON 09/24/2019] ALPRAZolam (XANAX) 0.5 MG tablet Take 1 tablet (0.5 mg total) by mouth 2 (two) times daily as needed for anxiety or sleep. 60 tablet 3  . atorvastatin (LIPITOR) 40 MG tablet Take 1 tablet by mouth daily.    Marland Kitchen atorvastatin (LIPITOR) 80 MG tablet Take 40 mg by mouth every evening.     . budesonide-formoterol (SYMBICORT) 160-4.5 MCG/ACT inhaler Inhale 2 puffs into the lungs 2 (two) times daily.    Marland Kitchen EPINEPHrine 0.3 mg/0.3 mL IJ SOAJ injection Inject 0.3 mg into the muscle as needed (allergic reaction).     . gabapentin (NEURONTIN) 300 MG capsule Take 1 capsule by mouth 3 (three) times daily.    Marland Kitchen lisinopril-hydrochlorothiazide (PRINZIDE,ZESTORETIC) 10-12.5 MG tablet Take 1 tablet by mouth daily.    . nortriptyline (PAMELOR) 25 MG capsule Total of 125 mg at night 90 capsule 1  . nortriptyline (PAMELOR) 50 MG capsule Total of 125 mg at night 180 capsule 1  . omeprazole (PRILOSEC) 20 MG capsule 1 PO 30 MINS BEFORE YOUR FIRST MEAL EVERY DAY FOREVER (Patient taking differently: Take 20 mg by mouth daily as needed (for acid reflux). ) 30 capsule 11  . umeclidinium bromide (INCRUSE ELLIPTA) 62.5 MCG/INH AEPB Inhale 1 puff into the lungs every morning.      No current facility-administered medications for this visit.      Musculoskeletal: Strength & Muscle Tone: N/A Gait & Station: N/A Patient leans: N/A  Psychiatric Specialty Exam: Review of Systems  Psychiatric/Behavioral: Positive for depression. Negative for hallucinations, memory loss, substance abuse and suicidal ideas. The patient is nervous/anxious and has insomnia.   All other systems reviewed and are negative.   There were no vitals taken for this visit.There is no height or weight on file to calculate BMI.  General Appearance: NA  Eye Contact:  NA  Speech:  Clear and Coherent  Volume:  Normal  Mood:  Depressed  Affect:  NA   Thought Process:  Coherent  Orientation:  Full (Time, Place, and Person)  Thought Content: Logical   Suicidal Thoughts:  No  Homicidal Thoughts:  No  Memory:  Immediate;   Good  Judgement:  Good  Insight:  Good  Psychomotor Activity:  Normal  Concentration:  Concentration: Good and Attention Span: Good  Recall:  Good  Fund of Knowledge: Good  Language: Good  Akathisia:  No  Handed:  Right  AIMS (if indicated): not done  Assets:  Communication Skills Desire for Improvement  ADL's:  Intact  Cognition: WNL  Sleep:  Poor   Screenings:   Assessment and Plan:  Garrett Klein is a 63 y.o. year old male with a history of depression, hypertension,history of hyperlipidemia,collagenous colitis , who presents for follow up appointment for Current mild episode of major depressive disorder without prior episode ([place])  # MDD, mild, single episode without psychotic features Patient continues to report depressive symptoms and anxiety in the context of upcoming holiday season with grief of loss of his wife from pancreatic cancer in Dec 2018.  We uptitrate nortriptyline to target depression and insomnia.  Discussed potential risk of drowsiness, cholinergic side effect, palpitation.  He has no known history of cardiac disease.  We will continue Xanax as needed for anxiety.  Discussed risk of dependence and oversedation.(Patient did have accidental overdose in 09/2017 with xanax and opioid).Validated his grief. Discussed behavioral activation.   Plan I have reviewed and updated plans as below 1.Increase nortriptyline 125 mg at night  2.Continue Xanax 0.5 mg twice a day as needed for anxiety, 08/24/2019  3. Next appointment: in January  Past trials of medication:sertraline,lexapro,mirtazapine (drowsiness),venlafaxine, xanax,  The patient demonstrates the following risk factors for suicide: Chronic risk factors for suicide include:psychiatric disorder ofdepression. Acute risk  factorsfor suicide include: loss (financial, interpersonal, professional). Protective factorsfor this patient include: positive social support, coping skills and hope for the future. Considering these factors, the overall suicide risk at   [name], MD 09/06/2019, 9:42 AM

## 2019-09-01 ENCOUNTER — Ambulatory Visit (INDEPENDENT_AMBULATORY_CARE_PROVIDER_SITE_OTHER): Payer: Medicare Other | Admitting: Gastroenterology

## 2019-09-01 ENCOUNTER — Other Ambulatory Visit: Payer: Self-pay

## 2019-09-01 ENCOUNTER — Encounter: Payer: Self-pay | Admitting: Gastroenterology

## 2019-09-01 DIAGNOSIS — R195 Other fecal abnormalities: Secondary | ICD-10-CM

## 2019-09-01 DIAGNOSIS — M25552 Pain in left hip: Secondary | ICD-10-CM

## 2019-09-01 DIAGNOSIS — K529 Noninfective gastroenteritis and colitis, unspecified: Secondary | ICD-10-CM | POA: Diagnosis not present

## 2019-09-01 DIAGNOSIS — M25551 Pain in right hip: Secondary | ICD-10-CM | POA: Diagnosis not present

## 2019-09-01 MED ORDER — CLENPIQ 10-3.5-12 MG-GM -GM/160ML PO SOLN: 1.0000 | Freq: Once | ORAL | 0 refills | Status: AC
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
## 2019-09-01 NOTE — Patient Instructions (Addendum)
Do chair yoga 3 or 4 times a week to strengthen your hips.  CONTINUE OMEPRAZOLE.  TAKE 30 MINUTES PRIOR TO YOUR MEALS ONCE OR TWICE DAILY.  TRY VIBERZI. TAKE ONE TABLET TWICE DAILY. USE IMODIUM 1 OR 2 TIMES A DAY IF NEEDED.  IF VIBERZI WORKS, CALL ME FOR A PRESCRIPTION.  YOU MUST CALL IF YOU GO MORE THAN 4 DAYS WITHOUT A BOWEL MOVEMENT OR YOU DEVELOP INCREASE IN NAUSEA, VOMITING, OR ABDOMINAL PAIN.  IF VIBERZI WORKS, CALL ME FOR A PRESCRIPTION.  COMPLETE COLONOSCOPY WITHIN THE NEXT 2-3 MOS.  FOLLOW UP IN 6 MOS.

## 2019-09-01 NOTE — Assessment & Plan Note (Signed)
SYMPTOMS NOT CONTROLLED AND ASSOCIATED WITH PAIN RELIEVED WITH BMs.  Do chair yoga 3 or 4 times a week to strengthen your hips.  CONTINUE OMEPRAZOLE.  TAKE 30 MINUTES PRIOR TO YOUR MEALS ONCE OR TWICE DAILY.  TRY VIBERZI. TAKE ONE TABLET TWICE DAILY. USE IMODIUM 1 OR 2 TIMES A DAY IF NEEDED. MED SIDE EFFECTS DISCUSSED TO INCLUDE PANCREATITIS.  IF VIBERZI WORKS, CALL ME FOR A PRESCRIPTION.  YOU MUST CALL IF YOU GO MORE THAN 4 DAYS WITHOUT A BOWEL MOVEMENT OR YOU DEVELOP INCREASE IN NAUSEA, VOMITING, OR ABDOMINAL PAIN.  IF VIBERZI WORKS, CALL ME FOR A PRESCRIPTION.  COMPLETE COLONOSCOPY WITHIN THE NEXT 2-3 MOS.  FOLLOW UP IN 6 MOS.

## 2019-09-01 NOTE — Assessment & Plan Note (Signed)
SYMPTOMS NOT IDEALLY CONTROLLED.  CHAIR YOGA 3 OR 4 TIMES A WEEK TO STRENGTHEN HIPS.  HANDOUT GIVEN AND DISCUSSED.

## 2019-09-01 NOTE — Assessment & Plan Note (Signed)
OCCASIONAL BRBPR. LAST TCS AUG 2018-HYPERPLASTIC POLYPS REMOVED.  COMPLETE COLONOSCOPY. NO NEED FOR STOOL TESTING AFTER PT HAS HAD THE GOLD STANDARD FOR AGE APPROPRIATE COLON CANCER SCREENING.

## 2019-09-06 ENCOUNTER — Other Ambulatory Visit: Payer: Self-pay

## 2019-09-06 ENCOUNTER — Ambulatory Visit (INDEPENDENT_AMBULATORY_CARE_PROVIDER_SITE_OTHER): Payer: Medicare Other | Admitting: Psychiatry

## 2019-09-06 ENCOUNTER — Telehealth: Payer: Self-pay | Admitting: Gastroenterology

## 2019-09-06 ENCOUNTER — Encounter (HOSPITAL_COMMUNITY): Payer: Self-pay | Admitting: Psychiatry

## 2019-09-06 DIAGNOSIS — F320 Major depressive disorder, single episode, mild: Secondary | ICD-10-CM | POA: Diagnosis not present

## 2019-09-06 MED ORDER — NORTRIPTYLINE HCL 25 MG PO CAPS: ORAL_CAPSULE | ORAL | 1 refills | Status: DC

## 2019-09-06 MED ORDER — NORTRIPTYLINE HCL 50 MG PO CAPS: ORAL_CAPSULE | ORAL | 1 refills | Status: DC

## 2019-09-06 MED ORDER — ALPRAZOLAM 0.5 MG PO TABS: 0.5000 mg | ORAL_TABLET | Freq: Two times a day (BID) | ORAL | 3 refills | Status: DC | PRN

## 2019-09-06 NOTE — Patient Instructions (Signed)
1.Increase nortriptyline 125 mg at night  2.Continue Xanax 0.5 mg twice a day as needed for anxiety 3. Next appointment: in January

## 2019-09-06 NOTE — Telephone Encounter (Signed)
Forwarding to Dr.Fields.  

## 2019-09-06 NOTE — Telephone Encounter (Signed)
Pt said he tried the samples that SF gave him last week and they are working, He needs a prescription called into [facility].([place])

## 2019-09-07 MED ORDER — VIBERZI 75 MG PO TABS: 75.0000 mg | ORAL_TABLET | Freq: Two times a day (BID) | ORAL | 5 refills | Status: DC

## 2019-09-07 NOTE — Telephone Encounter (Signed)
PLEASE CALL PT. Rx sent FOR VIBERZI 75 MG BID.Marland Kitchen

## 2019-09-07 NOTE — Addendum Note (Signed)
Addended by: Danie Binder on: 09/07/2019 10:48 AM   Modules accepted: Orders

## 2019-09-08 ENCOUNTER — Telehealth: Payer: Self-pay | Admitting: Gastroenterology

## 2019-09-08 NOTE — Telephone Encounter (Signed)
Left message that I have received notice from pharmacy and will be working on PA.

## 2019-09-08 NOTE — Telephone Encounter (Signed)
Patient called and said that his pharmacy sent over an approval form for his viberzi. It needed prior Josem Kaufmann   You can call him at [phone]

## 2019-09-16 ENCOUNTER — Telehealth: Payer: Self-pay | Admitting: Gastroenterology

## 2019-09-16 NOTE — Telephone Encounter (Signed)
Pt called back and his name, date of birth and address and insurance info matches what I have. He is going to call his insurance and get back with me.

## 2019-09-16 NOTE — Telephone Encounter (Signed)
[employer] called for DS regarding patient. I told him DS would be back on Monday. [phone]

## 2019-09-16 NOTE — Telephone Encounter (Signed)
Unable to complete in Cover My Meds until verify pt's information. He cannot be identified at this time.  Called and left VM for a return call from pt.

## 2019-09-21 NOTE — Telephone Encounter (Signed)
I returned the call and spoke to [place]. She took info over the phone for the PA for [place]. Said she will fax some info to me.

## 2019-09-24 ENCOUNTER — Telehealth: Payer: Self-pay | Admitting: Gastroenterology

## 2019-09-24 NOTE — Telephone Encounter (Signed)
PATIENT CALLED STATING HIS INSURANCE NEEDED A PRIOR [place] ON HIS [place]

## 2019-09-27 ENCOUNTER — Other Ambulatory Visit (HOSPITAL_COMMUNITY): Payer: Self-pay | Admitting: Psychiatry

## 2019-09-27 MED ORDER — ALPRAZOLAM 1 MG PO TABS: 0.5000 mg | ORAL_TABLET | Freq: Two times a day (BID) | ORAL | 2 refills | Status: DC | PRN

## 2019-09-29 NOTE — Telephone Encounter (Signed)
Received the fax and completed and faxed back.

## 2019-09-29 NOTE — Telephone Encounter (Signed)
PT is aware I am working on [place]. See previous note.

## 2019-10-04 NOTE — Telephone Encounter (Signed)
Received a fax from [place] that Garrett Klein is denied. Pt does not have diagnosis of IBS-Diarrhea and also they need specific medical reason that he is unable to use Xifaxan. Dr. Oneida Alar, please advise!

## 2019-10-04 NOTE — Telephone Encounter (Signed)
PLEASE CALL PT's INSURANCE. PT DOES HAVE IBS-D.HE COULD TRY XIFAXAN IF THAT IS THEIR FIRST LINE OF TREATMENT.

## 2019-10-05 ENCOUNTER — Telehealth: Payer: Self-pay | Admitting: Gastroenterology

## 2019-10-05 NOTE — Telephone Encounter (Signed)
I spoke to [place] at [place] and she said they cannot take any updated information since this has been denied. She is faxing paperwork over to do an appeal.

## 2019-10-05 NOTE — Telephone Encounter (Signed)
I called [employer] and was told this is medication and [employer] needs to handle it.

## 2019-10-05 NOTE — Telephone Encounter (Signed)
[phone] PLEASE CALL PATIENT ABOUT HIS MEDICATION, CALLED AND SAID WE WERE TRYING TO GET IT PRIOR AUTHORIZED

## 2019-10-05 NOTE — Telephone Encounter (Signed)
LMOM to call.

## 2019-10-05 NOTE — Telephone Encounter (Signed)
I called [employer] and was told to call [facility] since this has already been denied. They said call [phone] opt 2.

## 2019-10-06 NOTE — Telephone Encounter (Signed)
PT is aware I am still working on this.

## 2019-10-12 ENCOUNTER — Encounter: Payer: Self-pay | Admitting: *Deleted

## 2019-10-12 ENCOUNTER — Telehealth: Payer: Self-pay | Admitting: *Deleted

## 2019-10-12 NOTE — Telephone Encounter (Signed)
Called pt and informed him that his Covid screening needed to be changed to 11/20/2019.  Pt is aware of new date and time.  Pt informed that I mail him out a letter with new appointment information.  Pt voiced understanding.

## 2019-10-18 ENCOUNTER — Telehealth: Payer: Self-pay

## 2019-10-18 NOTE — Telephone Encounter (Signed)
REVIEWED. WILL WRITE APPEAL LETTER BY COB DEC 4.

## 2019-10-18 NOTE — Telephone Encounter (Signed)
Received the denial for the Viberzi  I called and spoke to [place] and she said we would need to do an appeal letter.  I used the diagnosis of chronic diarrhea for the PA request.  I tried to send another one with the updated diagnosis of IBS-D and could not get it to go through.   Dr. Oneida Alar, please write a note of appeal for me to fax.

## 2019-10-21 NOTE — Telephone Encounter (Signed)
Pt is aware Dr. Oneida Alar will do an appeal letter.

## 2019-10-22 NOTE — Telephone Encounter (Signed)
I received a call from [employer] from [place]. She is faxing a paper with fax number to fax back with appeal letter when Dr. Oneida Alar does letter.

## 2019-10-27 NOTE — Telephone Encounter (Signed)
Faxed to [phone].

## 2019-10-27 NOTE — Telephone Encounter (Signed)
Letter written DEC 9.

## 2019-11-02 ENCOUNTER — Telehealth: Payer: Self-pay | Admitting: *Deleted

## 2019-11-02 NOTE — Telephone Encounter (Signed)
Patient called back. He has r/s'd procedure to 3/9 at 2:30pm. Patient aware will mail new instructions with new pre-op appt. He voiced understanding. Endo aware of new appt details

## 2019-11-02 NOTE — Telephone Encounter (Signed)
Received call from endo. Patient unable to find transportation for covid/pre-op appt on 12/31. Patient needs to r/s procedure scheduled for 11/22/2019.  Called pt, LMOVM

## 2019-11-16 NOTE — Telephone Encounter (Signed)
Received a letter of approval for the [place]. Authorization # [id number] and is good 09/29/2019 through 11/17/2020. I have faxed to pharmacy and pt is aware.

## 2019-11-18 ENCOUNTER — Other Ambulatory Visit (HOSPITAL_COMMUNITY): Payer: Medicare Other

## 2019-11-20 ENCOUNTER — Other Ambulatory Visit (HOSPITAL_COMMUNITY): Payer: Medicare Other

## 2019-11-22 ENCOUNTER — Telehealth (HOSPITAL_COMMUNITY): Payer: Self-pay | Admitting: *Deleted

## 2019-11-22 NOTE — Telephone Encounter (Signed)
PATIENT CALLED TO MAKE F/U APPT

## 2019-12-02 ENCOUNTER — Other Ambulatory Visit (HOSPITAL_COMMUNITY): Payer: Self-pay | Admitting: *Deleted

## 2019-12-02 NOTE — Telephone Encounter (Signed)
Patient called back after confirming appt 12/23/2019. Requested refill nortriptyline (PAMELOR) 25 MG &  50 mg capsule, stated he will be out of medication on 12/07/2019

## 2019-12-03 ENCOUNTER — Ambulatory Visit: Payer: Medicare Other | Admitting: Psychiatry

## 2019-12-03 ENCOUNTER — Other Ambulatory Visit: Payer: Self-pay

## 2019-12-03 MED ORDER — NORTRIPTYLINE HCL 50 MG PO CAPS: ORAL_CAPSULE | ORAL | 0 refills | Status: DC

## 2019-12-03 MED ORDER — NORTRIPTYLINE HCL 25 MG PO CAPS: ORAL_CAPSULE | ORAL | 1 refills | Status: DC

## 2019-12-03 NOTE — Addendum Note (Signed)
Addended by: [place] Crane on: 12/03/2019 09:15 AM   Modules accepted: Orders

## 2019-12-03 NOTE — Telephone Encounter (Signed)
Done

## 2019-12-07 ENCOUNTER — Ambulatory Visit (HOSPITAL_COMMUNITY): Payer: Medicare Other | Admitting: Psychiatry

## 2019-12-13 DIAGNOSIS — I2510 Atherosclerotic heart disease of native coronary artery without angina pectoris: Secondary | ICD-10-CM | POA: Diagnosis not present

## 2019-12-13 DIAGNOSIS — J449 Chronic obstructive pulmonary disease, unspecified: Secondary | ICD-10-CM | POA: Diagnosis not present

## 2019-12-23 ENCOUNTER — Other Ambulatory Visit: Payer: Self-pay

## 2019-12-23 ENCOUNTER — Encounter: Payer: Self-pay | Admitting: Psychiatry

## 2019-12-23 ENCOUNTER — Ambulatory Visit (INDEPENDENT_AMBULATORY_CARE_PROVIDER_SITE_OTHER): Payer: Medicare Other | Admitting: Psychiatry

## 2019-12-23 DIAGNOSIS — F320 Major depressive disorder, single episode, mild: Secondary | ICD-10-CM | POA: Diagnosis not present

## 2019-12-23 DIAGNOSIS — F4321 Adjustment disorder with depressed mood: Secondary | ICD-10-CM | POA: Diagnosis not present

## 2019-12-23 MED ORDER — ALPRAZOLAM 0.5 MG PO TABS: 0.5000 mg | ORAL_TABLET | Freq: Two times a day (BID) | ORAL | 2 refills | Status: DC | PRN

## 2019-12-23 NOTE — Progress Notes (Signed)
[facility] MD OP Progress Note  Virtual Visit via Telephone Note  I connected with Garrett ENDSLEY Sr. on 12/23/19 at 10:00 AM EST by telephone and verified that I am speaking with the correct person using two identifiers.  I discussed the limitations, risks, security and privacy concerns of performing an evaluation and management service by telephone and the availability of in person appointments. I also discussed with the patient that there may be a patient responsible charge related to this service. The patient expressed understanding and agreed to proceed.    12/23/2019 9:56 AM Garrett Salinas Sr.  MRN:  [id number]  Chief Complaint:  " I still miss my wife."  HPI: Patient reported that he still continues to miss his deceased wife.  He tries hard to get over it but things are hard.  He still has crying spells and is still very hard for him to talk about her death.  Informed that he has been attending counseling classes for grief which helps partially but the loss is still fresh for him.  He informed that after trying so many different medications nortriptyline seems to be more helpful than the others.  He feels he is doing better but is still mourning his wife's death. He informed his children do come around as much as they can and help him and support him when he needs them. He informed that his appetite has not been good and he gets Ensure from the [facility] hospital which helps.  He informed that he lost significant weight from 198 pounds to 142 pounds after his wife's demise. His sleep is okay but not too great.  He has been taking Xanax 1 tablet twice daily.  Visit Diagnosis:    ICD-10-CM   1. Current mild episode of major depressive disorder without prior episode ([place])  F32.0   2. Grief  F43.21     Past Psychiatric History: MDD, grief  Past Medical History:  Past Medical History:  Diagnosis Date  . Chronic lower back pain   . Collagenous colitis [address] [place]  . COPD (chronic obstructive  pulmonary disease) ([place])   . Essential hypertension   . Hyperlipidemia   . MRSA infection   . PUD (peptic ulcer disease)     Past Surgical History:  Procedure Laterality Date  . BACK SURGERY    . BIOPSY  06/03/2017   Procedure: BIOPSY;  Surgeon: Danie Binder, MD;  [facility];  Service: Endoscopy;;  colon  . COLONOSCOPY  2001   Dr. Sharlett Iles, normal  . COLONOSCOPY  04/2013   [facility]: Fentanyl 200mg Danford Bad 10mg : sigmoid diverticulosis, normal terminal ileum, small internal hemorrhoids, random colon bx: collagenous colitis. next tcs 04/2018  . COLONOSCOPY  2011   [facility]: two polyps, sessile serrated adenoma, mild diverticulosis  . COLONOSCOPY WITH PROPOFOL N/A 06/03/2017   Dr. Oneida Alar: Diverticulosis, three 2 to 3 mm polyps removed from the mid transverse colon and cecum, three 4 to 6 mm polyps in the rectum and mid descending colon removed.  External/internal hemorrhoids.  hyperplastic polyps.  Random colon biopsies negative for microscopic colitis. next colonoscopy in 5 years.  . ELBOW BURSA SURGERY    . ESOPHAGEAL DILATION N/A 05/19/2015   Procedure: ESOPHAGEAL DILATION;  Surgeon: Danie Binder, MD;  [facility];  Service: Endoscopy;  Laterality: N/A;  . ESOPHAGOGASTRODUODENOSCOPY N/A 10/28/2014   [facility]: 1. Esophagitis due to GERD. KOH neg. 2. Small hiatal hernia 3. Moderate erosive gastritis 4.  RUQ pain due to large ulcer 5. Nebo duodentis in the bulb.   . ESOPHAGOGASTRODUODENOSCOPY N/A 05/19/2015   [facility]: 1. Mild distal esophagitis 2. Peptic stricture at the gastroesophageal junction 3. Mild non-erosive gastritis 4. Pseudo pylorus due to prior PUD.   Marland Kitchen ESOPHAGOGASTRODUODENOSCOPY N/A 03/07/2017   Dr. Oneida Alar: Benign-appearing esophageal stenosis status post dilation, mild gastritis  . FINGER AMPUTATION Left 1985   index finger  . HYDROCELE EXCISION Right 10/02/2018   Procedure: HYDROCELECTOMY ADULT;  Surgeon: Irine Seal, MD;  Location: [facility];  Service: Urology;   Laterality: Right;  . JOINT REPLACEMENT     left hip surgery - January 26, 2014  . POLYPECTOMY  06/03/2017   Procedure: POLYPECTOMY;  Surgeon: Danie Binder, MD;  [facility];  Service: Endoscopy;;  colon  . ROTATOR CUFF REPAIR     November 2014  . SAVORY DILATION N/A 03/07/2017   Procedure: SAVORY DILATION;  Surgeon: Danie Binder, MD;  [facility];  Service: Endoscopy;  Laterality: N/A;    Family Psychiatric History: denied  Family History:  Family History  Problem Relation Age of Onset  . Cancer Mother        ovarian  . Diabetes type II Father   . Heart attack Father   . Arthritis Sister   . Stroke Brother   . Coronary artery disease Brother   . Diabetes Other   . Heart attack Other   . Colon cancer Neg Hx   . Liver disease Neg Hx   . Ulcers Neg Hx     Social History:  Social History   Socioeconomic History  . Marital status: Widowed    Spouse name: Not on file  . Number of children: Not on file  . Years of education: Not on file  . Highest education level: Not on file  Occupational History  . Not on file  Tobacco Use  . Smoking status: Current Every Day Smoker    Packs/day: 0.50    Years: 40.00    Pack years: 20.00    Types: Cigarettes    Start date: 01/09/1969  . Smokeless tobacco: Never Used  Substance and Sexual Activity  . Alcohol use: No    Alcohol/week: 0.0 standard drinks  . Drug use: No  . Sexual activity: Not on file  Other Topics Concern  . Not on file  Social History Narrative  . Not on file   Social Determinants of Health   Financial Resource Strain:   . Difficulty of Paying Living Expenses: Not on file  Food Insecurity:   . Worried About Charity fundraiser in the Last Year: Not on file  . Ran Out of Food in the Last Year: Not on file  Transportation Needs:   . Lack of Transportation (Medical): Not on file  . Lack of Transportation (Non-Medical): Not on file  Physical Activity:   . Days of Exercise per Week:  Not on file  . Minutes of Exercise per Session: Not on file  Stress:   . Feeling of Stress : Not on file  Social Connections:   . Frequency of Communication with Friends and Family: Not on file  . Frequency of Social Gatherings with Friends and Family: Not on file  . Attends Religious Services: Not on file  . Active Member of Clubs or Organizations: Not on file  . Attends Archivist Meetings: Not on file  . Marital Status: Not on file    Allergies:  Allergies  Allergen Reactions  . Bee Venom Swelling  . Ciprofloxacin Other (See Comments)    Turns red from the chest up.  [place] [Levofloxacin In D5w] Other (See Comments)    CANDIDA ALL OVER MOUTH TO PENIS  . Tramadol Hives       . Hydrocodone Rash  . Sulfa Antibiotics Rash    Metabolic Disorder Labs: No results found for: HGBA1C, MPG No results found for: PROLACTIN No results found for: CHOL, TRIG, HDL, CHOLHDL, VLDL, LDLCALC Lab Results  Component Value Date   TSH 1.27 09/15/2018   TSH 0.829 08/18/2015    Therapeutic Level Labs: No results found for: LITHIUM No results found for: VALPROATE No components found for:  CBMZ  Current Medications: Current Outpatient Medications  Medication Sig Dispense Refill  . albuterol (PROVENTIL HFA;VENTOLIN HFA) 108 (90 BASE) MCG/ACT inhaler Inhale 2 puffs into the lungs every 6 (six) hours as needed for wheezing or shortness of breath.    Marland Kitchen albuterol (PROVENTIL) (2.5 MG/3ML) 0.083% nebulizer solution Take 2.5 mg by nebulization every 6 (six) hours as needed for wheezing or shortness of breath.    . ALPRAZolam (XANAX) 1 MG tablet Take 0.5 tablets (0.5 mg total) by mouth 2 (two) times daily as needed for anxiety or sleep. 30 tablet 2  . atorvastatin (LIPITOR) 40 MG tablet Take 1 tablet by mouth daily.    Marland Kitchen atorvastatin (LIPITOR) 80 MG tablet Take 40 mg by mouth every evening.     . budesonide-formoterol (SYMBICORT) 160-4.5 MCG/ACT inhaler Inhale 2 puffs into the lungs 2  (two) times daily.    . Eluxadoline (VIBERZI) 75 MG TABS Take 75 mg by mouth 2 (two) times daily. 60 tablet 5  . EPINEPHrine 0.3 mg/0.3 mL IJ SOAJ injection Inject 0.3 mg into the muscle as needed (allergic reaction).     . gabapentin (NEURONTIN) 300 MG capsule Take 1 capsule by mouth 3 (three) times daily.    Marland Kitchen lisinopril-hydrochlorothiazide (PRINZIDE,ZESTORETIC) 10-12.5 MG tablet Take 1 tablet by mouth daily.    . nortriptyline (PAMELOR) 25 MG capsule Take 1 capsule along with two 50 mg capsules at night (Total of 125 mg) 90 capsule 1  . nortriptyline (PAMELOR) 50 MG capsule Take 2 capsules along with 25 mg capsule at night (Total 125 mg) 180 capsule 0  . omeprazole (PRILOSEC) 20 MG capsule 1 PO 30 MINS BEFORE YOUR FIRST MEAL EVERY DAY FOREVER (Patient taking differently: Take 20 mg by mouth daily as needed (for acid reflux). ) 30 capsule 11  . umeclidinium bromide (INCRUSE ELLIPTA) 62.5 MCG/INH AEPB Inhale 1 puff into the lungs every morning.      No current facility-administered medications for this visit.      Psychiatric Specialty Exam: Review of Systems  There were no vitals taken for this visit.There is no height or weight on file to calculate BMI.  General Appearance: unable to assess due to phone visit  Eye Contact:  unable to assess due to phone visit  Speech:  Clear and Coherent and Normal Rate  Volume:  Normal  Mood:  Sad  Affect:  Congruent  Thought Process:  Goal Directed and Descriptions of Associations: Intact  Orientation:  Full (Time, Place, and Person)  Thought Content: Logical   Suicidal Thoughts:  No  Homicidal Thoughts:  No  Memory:  Immediate;   Good Recent;   Good  Judgement:  Fair  Insight:  Fair  Psychomotor Activity:  Normal  Concentration:  Concentration: Good and Attention Span:  Good  Recall:  Good  Fund of Knowledge: Good  Language: Good  Akathisia:  Negative  Handed:  Right  AIMS (if indicated): not done  Assets:  Communication Skills Desire  for Improvement Financial Resources/Insurance Housing  ADL's:  Intact  Cognition: WNL  Sleep:  Fair    Assessment and Plan: 64 year old male with history of depression who is still grieving the loss of his wife's death.  He is currently being managed on nortriptyline 125 mg at bedtime and Xanax 0.5 mg twice daily.  1. Current mild episode of major depressive disorder without prior episode ([facility]) - Continue Nortriptyline 125 mg HS, prescription sent on January 15. - ALPRAZolam (XANAX) 0.5 MG tablet; Take 1 tablet (0.5 mg total) by mouth 2 (two) times daily as needed for anxiety or sleep.  Dispense: 60 tablet; Refill: 2  2. Grief  Pt was provided with much supportive therapy. He was advised to continue grief counseling. F/up with Dr. Modesta Messing in 3 months.  [place] Crane, MD 12/23/2019, 9:56 AM

## 2019-12-31 DIAGNOSIS — Z743 Need for continuous supervision: Secondary | ICD-10-CM | POA: Diagnosis not present

## 2019-12-31 DIAGNOSIS — R000 Tachycardia, unspecified: Secondary | ICD-10-CM | POA: Diagnosis not present

## 2019-12-31 DIAGNOSIS — T68XXXA Hypothermia, initial encounter: Secondary | ICD-10-CM | POA: Diagnosis not present

## 2019-12-31 DIAGNOSIS — R0689 Other abnormalities of breathing: Secondary | ICD-10-CM | POA: Diagnosis not present

## 2020-01-10 DIAGNOSIS — J449 Chronic obstructive pulmonary disease, unspecified: Secondary | ICD-10-CM | POA: Diagnosis not present

## 2020-01-10 DIAGNOSIS — I2510 Atherosclerotic heart disease of native coronary artery without angina pectoris: Secondary | ICD-10-CM | POA: Diagnosis not present

## 2020-01-21 ENCOUNTER — Other Ambulatory Visit: Payer: Self-pay

## 2020-01-21 ENCOUNTER — Encounter (HOSPITAL_COMMUNITY): Payer: Self-pay

## 2020-01-24 ENCOUNTER — Other Ambulatory Visit (HOSPITAL_COMMUNITY)
Admission: RE | Admit: 2020-01-24 | Discharge: 2020-01-24 | Disposition: A | Discharge status: Home / Self Care | Payer: Medicare Other | Source: Ambulatory Visit | Attending: Gastroenterology | Admitting: Gastroenterology

## 2020-01-24 ENCOUNTER — Other Ambulatory Visit: Payer: Self-pay

## 2020-01-24 ENCOUNTER — Telehealth: Payer: Self-pay | Admitting: Gastroenterology

## 2020-01-24 ENCOUNTER — Encounter (HOSPITAL_COMMUNITY)
Admission: RE | Admit: 2020-01-24 | Discharge: 2020-01-24 | Disposition: A | Discharge status: Home / Self Care | Payer: Medicare Other | Source: Ambulatory Visit | Attending: Gastroenterology | Admitting: Gastroenterology

## 2020-01-24 NOTE — Telephone Encounter (Signed)
Called pt back.  Pt says that Viberzi did slow diarrhea down but makes him sick.  He said that it makes him feel funny, headaches, and sick on stomach.  He is waking up at night with diarrhea.  Routing to LSL in SLF's absence.

## 2020-01-24 NOTE — Telephone Encounter (Signed)
Called pt and informed him of LSL's recommendations.  He was advised to stop Viberzi and use Imodium 2 mg TID until SLF back.  Pt voiced understanding.  He is aware that I will call him back once SLF reviews.

## 2020-01-24 NOTE — Telephone Encounter (Signed)
Called endo and procedure cancelled. °

## 2020-01-24 NOTE — Telephone Encounter (Signed)
Save for SLF review.  He can stop Viberzi.  Imodium 2mg  TID until SLF back.

## 2020-01-24 NOTE — Telephone Encounter (Signed)
Pt is having trouble trying to find someone to go with him to his procedure tomorrow and wants to cancel for now. He also wanted SF to know that the Viberzi was making him sick and was taking it once a day instead of the recommended twice a day. Please advise. [phone]

## 2020-01-25 ENCOUNTER — Encounter (HOSPITAL_COMMUNITY): Admission: RE | Payer: Self-pay | Source: Home / Self Care

## 2020-01-25 ENCOUNTER — Ambulatory Visit (HOSPITAL_COMMUNITY): Admission: RE | Admit: 2020-01-25 | Payer: Medicare Other | Source: Home / Self Care | Admitting: Gastroenterology

## 2020-01-25 SURGERY — COLONOSCOPY WITH PROPOFOL
Anesthesia: Monitor Anesthesia Care

## 2020-01-25 MED ORDER — BUDESONIDE 3 MG PO CPEP: ORAL_CAPSULE | ORAL | 1 refills | Status: DC

## 2020-01-25 NOTE — Telephone Encounter (Addendum)
Called patient TO DISCUSS CONCERNS. BMs:10-15/DAY AND EVEN AT NIGHT. PT MAY HAVE RELAPSE OF LYMPHOCYTIC COLITIS. RECOMMEND ENTOCORT 9 MG DAILY. IF NO IMPROVEMENT OVER THE NEXT MONTH, WILL STOP AND START CREON FOR EPI.   PT WILL CALL IN 7 DAYS IF SYMPTOMS ARE NOT IMPROVED.

## 2020-01-25 NOTE — Addendum Note (Signed)
Addended by: Danie Binder on: 01/25/2020 02:58 PM   Modules accepted: Orders

## 2020-01-26 DIAGNOSIS — I4892 Unspecified atrial flutter: Secondary | ICD-10-CM | POA: Diagnosis not present

## 2020-01-26 DIAGNOSIS — R918 Other nonspecific abnormal finding of lung field: Secondary | ICD-10-CM | POA: Diagnosis not present

## 2020-01-26 DIAGNOSIS — Z20822 Contact with and (suspected) exposure to covid-19: Secondary | ICD-10-CM | POA: Diagnosis not present

## 2020-01-26 DIAGNOSIS — E7800 Pure hypercholesterolemia, unspecified: Secondary | ICD-10-CM | POA: Diagnosis not present

## 2020-01-26 DIAGNOSIS — R55 Syncope and collapse: Secondary | ICD-10-CM | POA: Diagnosis not present

## 2020-01-26 DIAGNOSIS — Z720 Tobacco use: Secondary | ICD-10-CM | POA: Diagnosis not present

## 2020-01-26 DIAGNOSIS — I480 Paroxysmal atrial fibrillation: Secondary | ICD-10-CM | POA: Diagnosis not present

## 2020-01-26 DIAGNOSIS — E785 Hyperlipidemia, unspecified: Secondary | ICD-10-CM | POA: Diagnosis not present

## 2020-01-26 DIAGNOSIS — J449 Chronic obstructive pulmonary disease, unspecified: Secondary | ICD-10-CM | POA: Diagnosis not present

## 2020-01-26 DIAGNOSIS — I10 Essential (primary) hypertension: Secondary | ICD-10-CM | POA: Diagnosis not present

## 2020-01-26 DIAGNOSIS — Z7951 Long term (current) use of inhaled steroids: Secondary | ICD-10-CM | POA: Diagnosis not present

## 2020-01-27 DIAGNOSIS — I4892 Unspecified atrial flutter: Secondary | ICD-10-CM | POA: Diagnosis not present

## 2020-02-01 DIAGNOSIS — Z882 Allergy status to sulfonamides status: Secondary | ICD-10-CM | POA: Diagnosis not present

## 2020-02-01 DIAGNOSIS — Z79899 Other long term (current) drug therapy: Secondary | ICD-10-CM | POA: Diagnosis not present

## 2020-02-01 DIAGNOSIS — Z7901 Long term (current) use of anticoagulants: Secondary | ICD-10-CM | POA: Diagnosis not present

## 2020-02-01 DIAGNOSIS — Z881 Allergy status to other antibiotic agents status: Secondary | ICD-10-CM | POA: Diagnosis not present

## 2020-02-01 DIAGNOSIS — Z299 Encounter for prophylactic measures, unspecified: Secondary | ICD-10-CM | POA: Diagnosis not present

## 2020-02-01 DIAGNOSIS — Z91030 Bee allergy status: Secondary | ICD-10-CM | POA: Diagnosis not present

## 2020-02-01 DIAGNOSIS — F17210 Nicotine dependence, cigarettes, uncomplicated: Secondary | ICD-10-CM | POA: Diagnosis not present

## 2020-02-01 DIAGNOSIS — I4892 Unspecified atrial flutter: Secondary | ICD-10-CM | POA: Diagnosis not present

## 2020-02-01 DIAGNOSIS — Z96611 Presence of right artificial shoulder joint: Secondary | ICD-10-CM | POA: Diagnosis not present

## 2020-02-01 DIAGNOSIS — S0181XA Laceration without foreign body of other part of head, initial encounter: Secondary | ICD-10-CM | POA: Diagnosis not present

## 2020-02-01 DIAGNOSIS — I10 Essential (primary) hypertension: Secondary | ICD-10-CM | POA: Diagnosis not present

## 2020-02-01 DIAGNOSIS — Z96641 Presence of right artificial hip joint: Secondary | ICD-10-CM | POA: Diagnosis not present

## 2020-02-01 DIAGNOSIS — R58 Hemorrhage, not elsewhere classified: Secondary | ICD-10-CM | POA: Diagnosis not present

## 2020-02-01 DIAGNOSIS — Z880 Allergy status to penicillin: Secondary | ICD-10-CM | POA: Diagnosis not present

## 2020-02-01 DIAGNOSIS — S01119A Laceration without foreign body of unspecified eyelid and periocular area, initial encounter: Secondary | ICD-10-CM | POA: Diagnosis not present

## 2020-02-01 DIAGNOSIS — Z89022 Acquired absence of left finger(s): Secondary | ICD-10-CM | POA: Diagnosis not present

## 2020-02-01 DIAGNOSIS — S0121XA Laceration without foreign body of nose, initial encounter: Secondary | ICD-10-CM | POA: Diagnosis not present

## 2020-02-01 DIAGNOSIS — S299XXA Unspecified injury of thorax, initial encounter: Secondary | ICD-10-CM | POA: Diagnosis not present

## 2020-02-01 DIAGNOSIS — Z886 Allergy status to analgesic agent status: Secondary | ICD-10-CM | POA: Diagnosis not present

## 2020-02-01 DIAGNOSIS — Z23 Encounter for immunization: Secondary | ICD-10-CM | POA: Diagnosis not present

## 2020-02-01 DIAGNOSIS — S022XXA Fracture of nasal bones, initial encounter for closed fracture: Secondary | ICD-10-CM | POA: Diagnosis not present

## 2020-02-01 DIAGNOSIS — S0990XA Unspecified injury of head, initial encounter: Secondary | ICD-10-CM | POA: Diagnosis not present

## 2020-02-01 DIAGNOSIS — Z743 Need for continuous supervision: Secondary | ICD-10-CM | POA: Diagnosis not present

## 2020-02-10 DIAGNOSIS — J449 Chronic obstructive pulmonary disease, unspecified: Secondary | ICD-10-CM | POA: Diagnosis not present

## 2020-02-10 DIAGNOSIS — M7552 Bursitis of left shoulder: Secondary | ICD-10-CM | POA: Diagnosis not present

## 2020-02-10 DIAGNOSIS — F17210 Nicotine dependence, cigarettes, uncomplicated: Secondary | ICD-10-CM | POA: Diagnosis not present

## 2020-02-10 DIAGNOSIS — M7550 Bursitis of unspecified shoulder: Secondary | ICD-10-CM | POA: Diagnosis not present

## 2020-02-10 DIAGNOSIS — Z299 Encounter for prophylactic measures, unspecified: Secondary | ICD-10-CM | POA: Diagnosis not present

## 2020-02-10 DIAGNOSIS — I4892 Unspecified atrial flutter: Secondary | ICD-10-CM | POA: Diagnosis not present

## 2020-02-10 DIAGNOSIS — I10 Essential (primary) hypertension: Secondary | ICD-10-CM | POA: Diagnosis not present

## 2020-02-14 DIAGNOSIS — I2510 Atherosclerotic heart disease of native coronary artery without angina pectoris: Secondary | ICD-10-CM | POA: Diagnosis not present

## 2020-02-14 DIAGNOSIS — J449 Chronic obstructive pulmonary disease, unspecified: Secondary | ICD-10-CM | POA: Diagnosis not present

## 2020-02-18 DIAGNOSIS — J449 Chronic obstructive pulmonary disease, unspecified: Secondary | ICD-10-CM | POA: Diagnosis not present

## 2020-02-18 DIAGNOSIS — I2510 Atherosclerotic heart disease of native coronary artery without angina pectoris: Secondary | ICD-10-CM | POA: Diagnosis not present
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
## 2020-03-02 ENCOUNTER — Encounter (HOSPITAL_COMMUNITY): Payer: Self-pay | Admitting: Psychiatry

## 2020-03-02 ENCOUNTER — Other Ambulatory Visit: Payer: Self-pay

## 2020-03-02 ENCOUNTER — Ambulatory Visit (INDEPENDENT_AMBULATORY_CARE_PROVIDER_SITE_OTHER): Payer: Medicare Other | Admitting: Psychiatry

## 2020-03-02 DIAGNOSIS — F320 Major depressive disorder, single episode, mild: Secondary | ICD-10-CM

## 2020-03-02 MED ORDER — ALPRAZOLAM 0.5 MG PO TABS: 0.5000 mg | ORAL_TABLET | Freq: Two times a day (BID) | ORAL | 2 refills | Status: DC | PRN

## 2020-03-02 NOTE — Patient Instructions (Signed)
1.Continue nortriptyline 125 mg at night  2.Continue Xanax 0.5 mg twice a day as needed for anxiety  3. Next appointment:7/6 at 11:20

## 2020-03-07 ENCOUNTER — Other Ambulatory Visit (HOSPITAL_COMMUNITY): Payer: Self-pay | Admitting: Psychiatry

## 2020-03-07 ENCOUNTER — Telehealth (HOSPITAL_COMMUNITY): Payer: Self-pay | Admitting: *Deleted

## 2020-03-07 MED ORDER — NORTRIPTYLINE HCL 50 MG PO CAPS: ORAL_CAPSULE | ORAL | 0 refills | Status: DC

## 2020-03-07 NOTE — Telephone Encounter (Signed)
Ordered

## 2020-03-07 NOTE — Telephone Encounter (Signed)
REFILL REQUEST :: nortriptyline (PAMELOR) 50 MG capsule

## 2020-03-20 ENCOUNTER — Encounter (HOSPITAL_COMMUNITY): Payer: Self-pay | Admitting: Psychiatry

## 2020-03-20 NOTE — Telephone Encounter (Signed)
This encounter was created in error - please disregard.

## 2020-03-31 DIAGNOSIS — I10 Essential (primary) hypertension: Secondary | ICD-10-CM | POA: Diagnosis not present

## 2020-03-31 DIAGNOSIS — Z299 Encounter for prophylactic measures, unspecified: Secondary | ICD-10-CM | POA: Diagnosis not present

## 2020-03-31 DIAGNOSIS — E7800 Pure hypercholesterolemia, unspecified: Secondary | ICD-10-CM | POA: Diagnosis not present

## 2020-03-31 DIAGNOSIS — J449 Chronic obstructive pulmonary disease, unspecified: Secondary | ICD-10-CM | POA: Diagnosis not present

## 2020-04-12 ENCOUNTER — Telehealth: Payer: Self-pay | Admitting: Gastroenterology

## 2020-04-12 NOTE — Telephone Encounter (Signed)
Recall for ultrasound 

## 2020-04-12 NOTE — Telephone Encounter (Signed)
Recall sent 

## 2020-04-16 DIAGNOSIS — J449 Chronic obstructive pulmonary disease, unspecified: Secondary | ICD-10-CM | POA: Diagnosis not present

## 2020-04-16 DIAGNOSIS — I2510 Atherosclerotic heart disease of native coronary artery without angina pectoris: Secondary | ICD-10-CM | POA: Diagnosis not present

## 2020-04-19 ENCOUNTER — Telehealth: Payer: Self-pay | Admitting: Gastroenterology

## 2020-04-19 NOTE — Telephone Encounter (Signed)
Pt called asking for Dr McDowell's office number (heart doctor in [place]). I gave it to him and he read it back to me. He said he has blood pouring from his rectum and has diarrhea all the time and hasn't had a formed stool in months. I offered him OV for in the morning, but he wants to talk with Dr Domenic Polite first. He said he received a letter to schedule his U/S, but he has no one to drive him and stay with him at his appointments and/or procedures. I told him that if he was pouring blood he should call 911 and go to the ER because he could pass out and he is there by himself. He doesn't think he needs to go to the ER because he feels fine. Please call [phone]

## 2020-04-20 ENCOUNTER — Ambulatory Visit: Payer: Medicare Other | Admitting: Gastroenterology

## 2020-04-20 ENCOUNTER — Telehealth: Payer: Self-pay | Admitting: Emergency Medicine

## 2020-04-20 NOTE — Telephone Encounter (Signed)
Garrett Klein, this was routed to me but no information documented?

## 2020-04-20 NOTE — Telephone Encounter (Signed)
Sent in error

## 2020-04-20 NOTE — Telephone Encounter (Signed)
Pt made ov to see ab on 6/8 at 330

## 2020-04-20 NOTE — Telephone Encounter (Signed)
Pt called and he stated he is having blood in his stool and when he wipes. Pt has had blood in stool for more than 6 months and thinks its his hemorrhoids. Denies fever and has tried imodium and has done everything that doctor fields has suggest. Pt states his Last bm was this morning about 10am and there was blood in it and it was diarrhea. Pt states he has not had a normal  bm in several years.  States his pcp doctor shah notified him he needs to see a cardiologist. Pt does have an appt on 6/10 @2 :30

## 2020-04-20 NOTE — Telephone Encounter (Signed)
Agree with appt to reassess. Last seen in 08/2019. Last Hgb 01/2020 was 15.5.

## 2020-04-25 ENCOUNTER — Ambulatory Visit: Payer: Medicare Other | Admitting: Gastroenterology

## 2020-04-26 NOTE — Progress Notes (Signed)
Cardiology Office Note  Date: 04/27/2020   ID: Garrett Salinas Sr., DOB 1955-12-10, MRN [id number]  PCP:  [name], MD  Cardiologist:  Rozann Lesches, MD Electrophysiologist:  None   Chief Complaint: Follow-up coronary artery calcification on CT, HTN, HLD, COPD.  History of Present Illness: Garrett Calo. is a 64 y.o. male with a history of hypertension, hyperlipidemia, COPD, recent coronary CT with coronary artery calcification.  Last encounter with Dr. Domenic Polite via telemedicine June 22, 2019.  He did not report any definitive anginal symptoms.  He stated back in January 2020 he had an unusual sharp pain in his chest after twisting a certain way, lasted a few days, and has not recurred since then.  He was having some hip problems and had an orthopedic consultation at [facility] pending.  Patient here status post recent hospital visit in March for rapid heart rate.  Patient had initial heart rate of 165 and apparently was given a medication to slow his heart rate down.  He is uncertain as to which medication it might have been.  He is currently on Cardizem CD 120 mg/day.  He continues to have breakthrough palpitations and dizziness.  His blood pressure is low normal today.  He is a long-term smoker and has obstructive sleep apnea but is not compliant with CPAP due to appliance being uncomfortable.  He denies any other PND, orthopnea, DOE, stroke or TIA-like symptoms, claudication-like symptoms, DVT or PE-like symptoms, or lower extremity edema.   Past Medical History:  Diagnosis Date  . Chronic lower back pain   . Collagenous colitis [address] [place]  . COPD (chronic obstructive pulmonary disease) ([place])   . Essential hypertension   . Hyperlipidemia   . MRSA infection   . PUD (peptic ulcer disease)     Past Surgical History:  Procedure Laterality Date  . BACK SURGERY    . BIOPSY  06/03/2017   Procedure: BIOPSY;  Surgeon: Danie Binder, MD;  [facility];   Service: Endoscopy;;  colon  . COLONOSCOPY  2001   Dr. Sharlett Iles, normal  . COLONOSCOPY  04/2013   [facility]: Fentanyl 200mg Danford Bad 10mg : sigmoid diverticulosis, normal terminal ileum, small internal hemorrhoids, random colon bx: collagenous colitis. next tcs 04/2018  . COLONOSCOPY  2011   [facility]: two polyps, sessile serrated adenoma, mild diverticulosis  . COLONOSCOPY WITH PROPOFOL N/A 06/03/2017   Dr. Oneida Alar: Diverticulosis, three 2 to 3 mm polyps removed from the mid transverse colon and cecum, three 4 to 6 mm polyps in the rectum and mid descending colon removed.  External/internal hemorrhoids.  hyperplastic polyps.  Random colon biopsies negative for microscopic colitis. next colonoscopy in 5 years.  . ELBOW BURSA SURGERY    . ESOPHAGEAL DILATION N/A 05/19/2015   Procedure: ESOPHAGEAL DILATION;  Surgeon: Danie Binder, MD;  [facility];  Service: Endoscopy;  Laterality: N/A;  . ESOPHAGOGASTRODUODENOSCOPY N/A 10/28/2014   [facility]: 1. Esophagitis due to GERD. KOH neg. 2. Small hiatal hernia 3. Moderate erosive gastritis 4. RUQ pain due to large ulcer 5. Biehle duodentis in the bulb.   . ESOPHAGOGASTRODUODENOSCOPY N/A 05/19/2015   [facility]: 1. Mild distal esophagitis 2. Peptic stricture at the gastroesophageal junction 3. Mild non-erosive gastritis 4. Pseudo pylorus due to prior PUD.   Marland Kitchen ESOPHAGOGASTRODUODENOSCOPY N/A 03/07/2017   Dr. Oneida Alar: Benign-appearing esophageal stenosis status post dilation, mild gastritis  . FINGER AMPUTATION Left 1985   index finger  . HYDROCELE  EXCISION Right 10/02/2018   Procedure: HYDROCELECTOMY ADULT;  Surgeon: Irine Seal, MD;  Location: [facility];  Service: Urology;  Laterality: Right;  . JOINT REPLACEMENT     left hip surgery - January 26, 2014  . POLYPECTOMY  06/03/2017   Procedure: POLYPECTOMY;  Surgeon: Danie Binder, MD;  [facility];  Service: Endoscopy;;  colon  . ROTATOR CUFF REPAIR     November 2014  . SAVORY DILATION N/A 03/07/2017    Procedure: SAVORY DILATION;  Surgeon: Danie Binder, MD;  [facility];  Service: Endoscopy;  Laterality: N/A;    Current Outpatient Medications  Medication Sig Dispense Refill  . ALPRAZolam (XANAX) 0.5 MG tablet Take 1 tablet (0.5 mg total) by mouth 2 (two) times daily as needed for anxiety or sleep. 60 tablet 2  . atorvastatin (LIPITOR) 40 MG tablet Take 40 mg by mouth daily.     Marland Kitchen ELIQUIS 5 MG TABS tablet Take 1 tablet by mouth in the morning and at bedtime.    . Eluxadoline (VIBERZI) 75 MG TABS Take 75 mg by mouth 2 (two) times daily. (Patient taking differently: Take 75 mg by mouth daily. ) 60 tablet 5  . gabapentin (NEURONTIN) 300 MG capsule Take 300 mg by mouth 3 (three) times daily.     Marland Kitchen lisinopril-hydrochlorothiazide (ZESTORETIC) 10-12.5 MG tablet Take 1 tablet by mouth daily.    . nortriptyline (PAMELOR) 25 MG capsule Take 1 capsule along with two 50 mg capsules at night (Total of 125 mg) (Patient taking differently: Take 25 mg by mouth See admin instructions. Take 1 capsule along with two 50 mg capsules at night (Total of 125 mg)) 90 capsule 1  . nortriptyline (PAMELOR) 50 MG capsule Take 2 capsules along with 25 mg capsule at night (Total 125 mg) 180 capsule 0  . acetaminophen (TYLENOL) 650 MG CR tablet Take 650-1,300 mg by mouth every 8 (eight) hours as needed for pain.    Marland Kitchen albuterol (PROVENTIL HFA;VENTOLIN HFA) 108 (90 BASE) MCG/ACT inhaler Inhale 2 puffs into the lungs See admin instructions. Inhale 1-2 puffs scheduled every morning & every 6 hours as needed for wheezing/shortness of breath.    Marland Kitchen albuterol (PROVENTIL) (2.5 MG/3ML) 0.083% nebulizer solution Take 2.5 mg by nebulization every 6 (six) hours as needed for wheezing or shortness of breath.    . budesonide (ENTOCORT EC) 3 MG 24 hr capsule 3 PO DAILY FOR 8 WEEKS 90 capsule 1  . budesonide-formoterol (SYMBICORT) 160-4.5 MCG/ACT inhaler Inhale 2 puffs into the lungs 2 (two) times daily as needed (wheezing/shortness  of breath.).     Marland Kitchen cholecalciferol (VITAMIN D3) 25 MCG (1000 UNIT) tablet Take 1,000 Units by mouth in the morning, at noon, in the evening, and at bedtime.    Marland Kitchen CLENPIQ 10-3.5-12 MG-GM -GM/160ML SOLN Take 320 mg by mouth once.    Marland Kitchen EPINEPHrine 0.3 mg/0.3 mL IJ SOAJ injection Inject 0.3 mg into the muscle as needed (allergic reaction).     Marland Kitchen omeprazole (PRILOSEC) 20 MG capsule 1 PO 30 MINS BEFORE YOUR FIRST MEAL EVERY DAY FOREVER (Patient taking differently: Take 20 mg by mouth in the morning, at noon, and at bedtime. ) 30 capsule 11  . umeclidinium bromide (INCRUSE ELLIPTA) 62.5 MCG/INH AEPB Inhale 1 puff into the lungs every morning.      No current facility-administered medications for this visit.   Allergies:  Bee venom, Ciprofloxacin, Levaquin [levofloxacin in d5w], Tramadol, Hydrocodone, and Sulfa antibiotics   Social  History: The patient  reports that he has been smoking cigarettes. He started smoking about 51 years ago. He has a 20.00 pack-year smoking history. He has never used smokeless tobacco. He reports that he does not drink alcohol and does not use drugs.   Family History: The patient's family history includes Arthritis in his sister; Cancer in his mother; Coronary artery disease in his brother; Diabetes in an other family member; Diabetes type II in his father; Heart attack in his father and another family member; Stroke in his brother.   ROS:  Please see the history of present illness. Otherwise, complete review of systems is positive for none.  All other systems are reviewed and negative.   Physical Exam: VS:  BP 97/66 (BP Location: Right Arm, Cuff Size: Normal) Comment: 1442  Pulse 84   Ht 5\' 10"  (1.778 m)   Wt 184 lb 9.6 oz (83.7 kg)   SpO2 98%   BMI 26.49 kg/m , BMI Body mass index is 26.49 kg/m.  Wt Readings from Last 3 Encounters:  04/27/20 184 lb 9.6 oz (83.7 kg)  01/21/20 185 lb (83.9 kg)  09/01/19 186 lb (84.4 kg)    General: Patient appears comfortable at  rest. Neck: Supple, no elevated JVP or carotid bruits, no thyromegaly. Lungs: Clear to auscultation, nonlabored breathing at rest. Cardiac: Regular rate and rhythm, no S3 or significant systolic murmur, no pericardial rub. Extremities: No pitting edema, distal pulses 2+. Skin: Warm and dry. Musculoskeletal: No kyphosis. Neuropsychiatric: Alert and oriented x3, affect grossly appropriate.  ECG:  An ECG dated 01/26/2020 was personally reviewed today and demonstrated:  Normal sinus rhythm rate of 79, possible left atrial enlargement, right bundle branch block  Recent Labwork: No results found for requested labs within last 8760 hours.  No results found for: CHOL, TRIG, HDL, CHOLHDL, VLDL, LDLCALC, LDLDIRECT  Other Studies Reviewed Today:  Echocardiogram 01/27/2020 Indications : Atrial flutter  Summary  1. The left ventricle is normal in size with normal wall thickness.  2. The left ventricular systolic function is normal, LVEF is visually  estimated at 55-60%.  3. There is mild mitral valve regurgitation.  4. There is mild aortic regurgitation.  5. The left atrium is mildly dilated in size.  6. The right ventricle is upper normal in size, with normal systolic  function.  7. There is mild pulmonary hypertension, estimated pulmonary artery systolic  pressure is 41 mmHg.  8. The right atrium is mildly dilated in size.      Left Ventricle  The left ventricle is normal in size with normal wall thickness.  The left ventricular systolic function is normal, LVEF is visually estimated  at 55-60%.  There is normal left ventricular diastolic function.    Right Ventricle  The right ventricle is upper normal in size, with normal systolic function.      Left Atrium  The left atrium is mildly dilated in size.    Right Atrium  The right atrium is mildly dilated in size.      Aortic Valve  The aortic valve is trileaflet with normal  appearing leaflets with normal  excursion.  There is mild aortic regurgitation.  There is no evidence of a significant transvalvular gradient.    Pulmonic Valve  The pulmonic valve is normal.  There is no significant pulmonic regurgitation.  There is no evidence of a significant transvalvular gradient.    Mitral Valve  The mitral valve leaflets are normal with normal leaflet mobility.  There  is mild mitral valve regurgitation.    Tricuspid Valve  The tricuspid valve leaflets are normal, with normal leaflet mobility.  There is mild tricuspid regurgitation.  There is mild pulmonary hypertension, estimated pulmonary artery systolic  pressure is 41 mmHg.      Other Findings  Rhythm: Sinus Rhythm.    Pericardium/Pleural  There is a trivial, posterior pericardial effusion.    Inferior Vena Cava  The IVC is suboptimally visualized but probably suggests normal right atrial  pressure.    Aorta  The aorta is normal in size in the visualized segments.  Lexiscan Myoview 08/08/2015([facility]): No evidence of scar or ischemia with LVEF 59%.  Chest CTA 10/09/2014: IMPRESSION: No evidence of acute abnormality. No evidence of pulmonary emboli or thoracic aortic aneurysm.  Echocardiogram 08/29/2016: Study Conclusions  - Left ventricle: The cavity size was normal. Wall thickness was increased in a pattern of mild LVH. Systolic function was normal. The estimated ejection fraction was in the range of 60% to 65%. Wall motion was normal; there were no regional wall motion abnormalities. Doppler parameters are consistent with abnormal left ventricular relaxation (grade 1 diastolic dysfunction). - Aortic valve: Trileaflet; mildly calcified leaflets. There was trivial regurgitation. - Mitral valve: There was trivial regurgitation. - Right atrium: Central venous pressure (est): 3 mm Hg. - Tricuspid valve: There was trivial  regurgitation. - Pulmonary arteries: Systolic pressure could not be accurately estimated. - Pericardium, extracardiac: There was no pericardial effusion.  Impressions:  - Mild LVH with LVEF 60-65%. Grade 1 diastolic dysfunction. Trivial mitral regurgitation. Mildly sclerotic aortic valve with trivial aortic regurgitation. Trivial tricuspid regurgitation.  Lower extremity arterial Dopplers10/10/2016: Normal ABIs(right 1.3 and left 1.2)with no evidence of segmental lower extremity arterial disease at rest.  Assessment and Plan:  1. Atrial flutter, unspecified type ([place])   2. Tobacco abuse   3. Essential hypertension   4. Mixed hyperlipidemia   5. OSA (obstructive sleep apnea)    1. Atrial flutter, unspecified type [facility]) Recent hospital visit on 3//2021 for rapid heart rate.  Patient states he was given a medication twice which reduced his heart rate.  His initial EKG at 1512 on 01/26/2020 showed a wide QRS tachycardia with premature ventricular complexes or fusion complexes rate of 165 with right bundle branch block.  A subsequent EKG later on during that visit at 1740 01/26/2020 showed normal sinus rhythm with a rate of 79, possible left atrial enlargement, right bundle branch block.  He had a subsequent echocardiogram ordered by his primary care physician with indication of atrial flutter.  EF was 55 to 60%..  Mild MR mild AR PASP 41 mmHg.  Increase Cardizem dose to 180 mg daily.  Get TSH, T3, T4.  If palpitations not alleviated by increased dose of Cardizem will get a ZIO AT monitor.  2. Tobacco abuse Patient is a long-term tobacco user.  Continues to smoke.  Highly encouraged cessation or at the very least cutting down significantly on the amount of cigarettes he smokes daily.  Advised him the nicotine content and cigarettes can induce palpitations.  3. Essential hypertension Blood pressure is low today.  Patient is currently taking lisinopril/hydrochlorothiazide 10/12.5  daily.  Hold lisinopril/HCTZ for now due to low blood pressures.  4. Mixed hyperlipidemia Continue atorvastatin 40 mg daily.  We will attempt to obtain recent lipid levels from PCP.  5. OSA (obstructive sleep apnea) History of obstructive sleep apnea with CPAP.  Patient has been noncompliant with CPAP.  Discussed the ramifications of not being  compliant with CPAP therapy contributing to increased palpitations and arrhythmias.  Patient verbalizes understanding and will attempt to comply with therapy.  Medication Adjustments/Labs and Tests Ordered: Current medicines are reviewed at length with the patient today.  Concerns regarding medicines are outlined above.   Disposition: Follow-up with Dr. Domenic Polite or APP 3 months  Signed, Levell July, NP 04/27/2020 3:22 PM    [facility] [place], [place], [place] [address] Phone: [phone]; Fax: [phone]

## 2020-04-27 ENCOUNTER — Encounter: Payer: Self-pay | Admitting: Family Medicine

## 2020-04-27 ENCOUNTER — Encounter: Payer: Self-pay | Admitting: *Deleted

## 2020-04-27 ENCOUNTER — Ambulatory Visit (INDEPENDENT_AMBULATORY_CARE_PROVIDER_SITE_OTHER): Payer: Medicare HMO | Admitting: Family Medicine

## 2020-04-27 ENCOUNTER — Other Ambulatory Visit: Payer: Self-pay

## 2020-04-27 VITALS — BP 97/66 | HR 84 | Ht 70.0 in | Wt 184.6 lb

## 2020-04-27 DIAGNOSIS — E782 Mixed hyperlipidemia: Secondary | ICD-10-CM

## 2020-04-27 DIAGNOSIS — I4892 Unspecified atrial flutter: Secondary | ICD-10-CM

## 2020-04-27 DIAGNOSIS — G4733 Obstructive sleep apnea (adult) (pediatric): Secondary | ICD-10-CM

## 2020-04-27 DIAGNOSIS — Z720 Tobacco use: Secondary | ICD-10-CM | POA: Diagnosis not present

## 2020-04-27 DIAGNOSIS — I10 Essential (primary) hypertension: Secondary | ICD-10-CM | POA: Diagnosis not present

## 2020-04-27 MED ORDER — DILTIAZEM HCL ER COATED BEADS 180 MG PO CP24
180.0000 mg | ORAL_CAPSULE | Freq: Every day | ORAL | 3 refills | Status: DC
Start: 2020-04-27 — End: 2020-05-15

## 2020-04-27 NOTE — Patient Instructions (Addendum)
Medication Instructions:    Your physician has recommended you make the following change in your medication:   Increase diltiazem to 180 mg by mouth daily  Continue other medications the same  Labwork:  Your physician recommends that you return for non-fasting lab work in: TODAY to check your TSH, Free T3 & T4. This can be done at [facility].  Testing/Procedures:  NONE  Follow-Up:  Your physician recommends that you schedule a follow-up appointment in: 3 months (office).  Any Other Special Instructions Will Be Listed Below (If Applicable).   Call our office in 2 weeks to give [place] an update on your symptoms. [phone]  If you need a refill on your cardiac medications before your next appointment, please call your pharmacy.

## 2020-04-28 ENCOUNTER — Telehealth: Payer: Self-pay | Admitting: *Deleted

## 2020-04-28 DIAGNOSIS — I4891 Unspecified atrial fibrillation: Secondary | ICD-10-CM | POA: Diagnosis not present

## 2020-04-28 DIAGNOSIS — I4892 Unspecified atrial flutter: Secondary | ICD-10-CM | POA: Diagnosis not present

## 2020-04-28 NOTE — Telephone Encounter (Signed)
-----   Message from Verta Ellen., NP sent at 04/27/2020  7:47 PM EDT ----- Regarding: Garrett Klein Please call the patient and schedule a follow up sooner than 3 months. Have him follow up in one month. Just a reminder as we discussed earlier today, we are going to hold his Lisinopril / HCTZ for now. The likely reason his blood pressure is low is due to the addition of the Cardizem without a corresponding adjustment  down in his antihypertensive medication at discharge. The provider likely thought the addition of the Cardizem would not cause an additive effect in decreasing his blood pressure but heart rate only. Thank You

## 2020-04-28 NOTE — Telephone Encounter (Signed)
Patient informed and verbalized understanding of plan. 

## 2020-05-04 ENCOUNTER — Telehealth: Payer: Self-pay | Admitting: *Deleted

## 2020-05-04 NOTE — Telephone Encounter (Signed)
Patient informed and verbalized understanding of plan. 

## 2020-05-04 NOTE — Telephone Encounter (Signed)
His thyroid panel looked good so the the thyroid is not causing his palpitations. Thanks

## 2020-05-15 ENCOUNTER — Telehealth: Payer: Self-pay | Admitting: *Deleted

## 2020-05-15 DIAGNOSIS — R002 Palpitations: Secondary | ICD-10-CM

## 2020-05-15 DIAGNOSIS — I4892 Unspecified atrial flutter: Secondary | ICD-10-CM

## 2020-05-15 MED ORDER — DILTIAZEM HCL ER COATED BEADS 240 MG PO CP24
240.0000 mg | ORAL_CAPSULE | Freq: Every day | ORAL | 6 refills | Status: DC
Start: 2020-05-15 — End: 2020-05-23

## 2020-05-15 NOTE — Telephone Encounter (Signed)
Contacted office to give update on symptoms per last office visit note. Reports symptoms of dizziness and palpitations are about the same.  BP readings since last visit  04/29/20 120/70 & HR 107 04/30/20 137/84 & HR 91 & 119/66 & HR 85 05/01/20 141/79 & HR 90 & 119/66 & HR 96 05/02/20 127/70 & HR 83 & 131/73 & HR 85 05/03/20 135/83 & HR 94 & 112/72 & HR 84 05/04/20 121/79 & HR 94 & 122/73 & HR 103 05/05/20 135/82 & HR 79 05/06/20 147/84 & HR 81 & 138/82 & HR 83 05/07/20 125/70 & HR 87 05/08/20 106/63 & HR 86 & 128/78 & HR 84 05/09/20 167/89 & HR 93 & 149/88 & HR 100 (dizzy and shaking this day) 05/10/20 133/81 & HR 87

## 2020-05-15 NOTE — Telephone Encounter (Signed)
Garrett Klein E 3 minutes ago (9:02 AM)  HP   Pt calling to give update to [name] --please call [phone]

## 2020-05-15 NOTE — Telephone Encounter (Signed)
Patient informed and verbalized understanding of plan. 

## 2020-05-15 NOTE — Telephone Encounter (Signed)
Please increase Cardizem dose to 240 mg.  Get a ZIO AT monitor for 14 days.  Thank you

## 2020-05-15 NOTE — Telephone Encounter (Signed)
Pt calling to give update to [name] --please call [phone]

## 2020-05-16 ENCOUNTER — Telehealth (HOSPITAL_COMMUNITY): Payer: Self-pay | Admitting: *Deleted

## 2020-05-16 NOTE — Telephone Encounter (Signed)
Noted, although I am not sure if it is attributable to nortriptyline as he has been on the current dose since last October.

## 2020-05-16 NOTE — Progress Notes (Signed)
Virtual Visit via Telephone Note  I connected with Garrett PAPE Sr. on 05/23/20 at 11:20 AM EDT by telephone and verified that I am speaking with the correct person using two identifiers.   I discussed the limitations, risks, security and privacy concerns of performing an evaluation and management service by telephone and the availability of in person appointments. I also discussed with the patient that there may be a patient responsible charge related to this service. The patient expressed understanding and agreed to proceed.     I discussed the assessment and treatment plan with the patient. The patient was provided an opportunity to ask questions and all were answered. The patient agreed with the plan and demonstrated an understanding of the instructions.   The patient was advised to call back or seek an in-person evaluation if the symptoms worsen or if the condition fails to improve as anticipated.  Location: patient- home, provider- home office   I provided 12 minutes of non-face-to-face time during this encounter.   [name], MD    [facility] MD/PA/NP OP Progress Note  05/23/2020 11:39 AM Garrett Salinas Sr.  MRN:  [id number]  Chief Complaint:  Chief Complaint    Depression; Follow-up     HPI:  - Cardizem dose was increased, and lisinopril/hydrochlorothiazide was reduced since the last visit.  This is a follow-up appointment for depression.  He states that he has been feeling more withdrawn.  He had a flatter, and was placed on pacemaker last week.  He has an upcoming appointment with his cardiologist.  He states that he has not contacted with people at church for the past 2 weeks.  He agrees to try reaching out to people there.  Although he continues to talk with his brother twice a week, they have not met with each other for a while due to him living in [place].  He has occasional insomnia.  He feels down.  He has low energy.  He has fair concentration.  He has better  appetite since he started [employer] Whe[employer].  He denies SI.  He feels anxious and tense at times.  He denies panic attacks.   Visit Diagnosis:    ICD-10-CM   1. Current mild episode of major depressive disorder without prior episode (HCC)  F32.0 ALPRAZolam (XANAX) 0.5 MG tablet    Past Psychiatric History: Please see initial evaluation for full details. I have reviewed the history. No updates at this time.     Past Medical History:  Past Medical History:  Diagnosis Date  . Chronic lower back pain   . Collagenous colitis [address] [place]  . COPD (chronic obstructive pulmonary disease) ([place])   . Essential hypertension   . Hyperlipidemia   . MRSA infection   . PUD (peptic ulcer disease)     Past Surgical History:  Procedure Laterality Date  . BACK SURGERY    . BIOPSY  06/03/2017   Procedure: BIOPSY;  Surgeon: Danie Binder, MD;  [facility];  Service: Endoscopy;;  colon  . COLONOSCOPY  2001   Dr. Sharlett Iles, normal  . COLONOSCOPY  04/2013   [facility]: Fentanyl 2109m/versed 174m sigmoid diverticulosis, normal terminal ileum, small internal hemorrhoids, random colon bx: collagenous colitis. next tcs 04/2018  . [facility], sessile serrated adenoma, mild diverticulosis  . COLONOSCOPY WITH PROPOFOL N/A 06/03/2017   Dr. fiOneida AlarDiverticulosis, three 2 to 3 mm polyps removed from the mid  transverse colon and cecum, three 4 to 6 mm polyps in the rectum and mid descending colon removed.  External/internal hemorrhoids.  hyperplastic polyps.  Random colon biopsies negative for microscopic colitis. next colonoscopy in 5 years.  . ELBOW BURSA SURGERY    . ESOPHAGEAL DILATION N/A 05/19/2015   Procedure: ESOPHAGEAL DILATION;  Surgeon: Danie Binder, MD;  [facility];  Service: Endoscopy;  Laterality: N/A;  . ESOPHAGOGASTRODUODENOSCOPY N/A 10/28/2014   [facility]: 1. Esophagitis due to GERD. KOH neg. 2. Small hiatal hernia 3. Moderate erosive gastritis 4.  RUQ pain due to large ulcer 5. [place] duodentis in the bulb.   . ESOPHAGOGASTRODUODENOSCOPY N/A 05/19/2015   [facility]: 1. Mild distal esophagitis 2. Peptic stricture at the gastroesophageal junction 3. Mild non-erosive gastritis 4. Pseudo pylorus due to prior PUD.   Marland Kitchen ESOPHAGOGASTRODUODENOSCOPY N/A 03/07/2017   Dr. Oneida Alar: Benign-appearing esophageal stenosis status post dilation, mild gastritis  . FINGER AMPUTATION Left 1985   index finger  . HYDROCELE EXCISION Right 10/02/2018   Procedure: HYDROCELECTOMY ADULT;  Surgeon: Irine Seal, MD;  Location: [facility];  Service: Urology;  Laterality: Right;  . JOINT REPLACEMENT     left hip surgery - January 26, 2014  . POLYPECTOMY  06/03/2017   Procedure: POLYPECTOMY;  Surgeon: Danie Binder, MD;  [facility];  Service: Endoscopy;;  colon  . ROTATOR CUFF REPAIR     November 2014  . SAVORY DILATION N/A 03/07/2017   Procedure: SAVORY DILATION;  Surgeon: Danie Binder, MD;  [facility];  Service: Endoscopy;  Laterality: N/A;    Family Psychiatric History: Please see initial evaluation for full details. I have reviewed the history. No updates at this time.     Family History:  Family History  Problem Relation Age of Onset  . Cancer Mother        ovarian  . Diabetes type II Father   . Heart attack Father   . Arthritis Sister   . Stroke Brother   . Coronary artery disease Brother   . Diabetes Other   . Heart attack Other   . Colon cancer Neg Hx   . Liver disease Neg Hx   . Ulcers Neg Hx     Social History:  Social History   Socioeconomic History  . Marital status: Widowed    Spouse name: Not on file  . Number of children: Not on file  . Years of education: Not on file  . Highest education level: Not on file  Occupational History  . Not on file  Tobacco Use  . Smoking status: Current Every Day Smoker    Packs/day: 0.50    Years: 40.00    Pack years: 20.00    Types: Cigarettes    Start date: 01/09/1969  .  Smokeless tobacco: Never Used  Vaping Use  . Vaping Use: Never used  Substance and Sexual Activity  . Alcohol use: No    Alcohol/week: 0.0 standard drinks  . Drug use: No  . Sexual activity: Not on file  Other Topics Concern  . Not on file  Social History Narrative  . Not on file   Social Determinants of Health   Financial Resource Strain:   . Difficulty of Paying Living Expenses:   Food Insecurity:   . Worried About Charity fundraiser in the Last Year:   . Arboriculturist in the Last Year:   Transportation Needs:   . Film/video editor (Medical):   Marland Kitchen  Lack of Transportation (Non-Medical):   Physical Activity:   . Days of Exercise per Week:   . Minutes of Exercise per Session:   Stress:   . Feeling of Stress :   Social Connections:   . Frequency of Communication with Friends and Family:   . Frequency of Social Gatherings with Friends and Family:   . Attends Religious Services:   . Active Member of Clubs or Organizations:   . Attends Archivist Meetings:   Marland Kitchen Marital Status:     Allergies:  Allergies  Allergen Reactions  . Bee Venom Swelling  . Ciprofloxacin Other (See Comments)    Turns red from the chest up.  [place] [Levofloxacin In D5w] Other (See Comments)    CANDIDA ALL OVER MOUTH TO PENIS  . Tramadol Hives       . Hydrocodone Rash  . Sulfa Antibiotics Rash    Metabolic Disorder Labs: No results found for: HGBA1C, MPG No results found for: PROLACTIN No results found for: CHOL, TRIG, HDL, CHOLHDL, VLDL, LDLCALC Lab Results  Component Value Date   TSH 1.27 09/15/2018   TSH 0.829 08/18/2015    Therapeutic Level Labs: No results found for: LITHIUM No results found for: VALPROATE No components found for:  CBMZ  Current Medications: Current Outpatient Medications  Medication Sig Dispense Refill  . acetaminophen (TYLENOL) 650 MG CR tablet Take 650-1,300 mg by mouth every 8 (eight) hours as needed for pain.    Marland Kitchen albuterol (PROVENTIL  HFA;VENTOLIN HFA) 108 (90 BASE) MCG/ACT inhaler Inhale 2 puffs into the lungs See admin instructions. Inhale 1-2 puffs scheduled every morning & every 6 hours as needed for wheezing/shortness of breath.    Marland Kitchen albuterol (PROVENTIL) (2.5 MG/3ML) 0.083% nebulizer solution Take 2.5 mg by nebulization every 6 (six) hours as needed for wheezing or shortness of breath.    Derrill Memo ON 06/06/2020] ALPRAZolam (XANAX) 0.5 MG tablet Take 1 tablet (0.5 mg total) by mouth 2 (two) times daily as needed for anxiety or sleep. 60 tablet 1  . atorvastatin (LIPITOR) 40 MG tablet Take 40 mg by mouth daily.     . budesonide (ENTOCORT EC) 3 MG 24 hr capsule 3 PO DAILY FOR 8 WEEKS 90 capsule 1  . budesonide-formoterol (SYMBICORT) 160-4.5 MCG/ACT inhaler Inhale 2 puffs into the lungs 2 (two) times daily as needed (wheezing/shortness of breath.).     Marland Kitchen cholecalciferol (VITAMIN D3) 25 MCG (1000 UNIT) tablet Take 1,000 Units by mouth in the morning, at noon, in the evening, and at bedtime.    Marland Kitchen CLENPIQ 10-3.5-12 MG-GM -GM/160ML SOLN Take 320 mg by mouth once.    . diltiazem (CARDIZEM CD) 240 MG 24 hr capsule Take 1 capsule (240 mg total) by mouth daily. 90 capsule 1  . ELIQUIS 5 MG TABS tablet Take 1 tablet by mouth in the morning and at bedtime.    . Eluxadoline (VIBERZI) 75 MG TABS Take 75 mg by mouth 2 (two) times daily. (Patient taking differently: Take 75 mg by mouth daily. ) 60 tablet 5  . EPINEPHrine 0.3 mg/0.3 mL IJ SOAJ injection Inject 0.3 mg into the muscle as needed (allergic reaction).     . gabapentin (NEURONTIN) 300 MG capsule Take 300 mg by mouth 3 (three) times daily.     Marland Kitchen lisinopril-hydrochlorothiazide (ZESTORETIC) 10-12.5 MG tablet Take 1 tablet by mouth daily. 04/28/2020 HOLD    . nortriptyline (PAMELOR) 25 MG capsule Take 1 capsule along with two 50 mg capsules at night (  Total of 125 mg) 90 capsule 0  . nortriptyline (PAMELOR) 50 MG capsule Take 2 capsules along with 25 mg capsule at night (Total 125 mg) 180  capsule 0  . omeprazole (PRILOSEC) 20 MG capsule 1 PO 30 MINS BEFORE YOUR FIRST MEAL EVERY DAY FOREVER (Patient taking differently: Take 20 mg by mouth in the morning, at noon, and at bedtime. ) 30 capsule 11  . umeclidinium bromide (INCRUSE ELLIPTA) 62.5 MCG/INH AEPB Inhale 1 puff into the lungs every morning.      No current facility-administered medications for this visit.     Musculoskeletal: Strength & Muscle Tone: N/A Gait & Station: N/A Patient leans: N/A  Psychiatric Specialty Exam: Review of Systems  Psychiatric/Behavioral: Positive for dysphoric mood. Negative for agitation, behavioral problems, confusion, decreased concentration, hallucinations, self-injury, sleep disturbance and suicidal ideas. The patient is nervous/anxious. The patient is not hyperactive.   All other systems reviewed and are negative.   There were no vitals taken for this visit.There is no height or weight on file to calculate BMI.  General Appearance: NA  Eye Contact:  NA  Speech:  Clear and Coherent  Volume:  Normal  Mood:  Anxious  Affect:  NA  Thought Process:  Coherent  Orientation:  Full (Time, Place, and Person)  Thought Content: Logical   Suicidal Thoughts:  No  Homicidal Thoughts:  No  Memory:  Immediate;   Good  Judgement:  Good  Insight:  Fair  Psychomotor Activity:  Normal  Concentration:  Concentration: Good and Attention Span: Good  Recall:  Good  Fund of Knowledge: Good  Language: Good  Akathisia:  No  Handed:  Right  AIMS (if indicated): not done  Assets:  Communication Skills Desire for Improvement  ADL's:  Intact  Cognition: WNL  Sleep:  Poor   Screenings:   Assessment and Plan:  Garrett FISSEL Sr. is a 64 y.o. year old male with a history of  depression, hypertension, Atrial flutter, OSA not adherent to CPAP, history of hyperlipidemia,collagenous colitis, , who presents for follow up appointment for below.   1. Current mild episode of major depressive disorder  without prior episode ([place]) He reports slight worsening in anxiety and isolation in the context of recent episode of a flutter.  Other psychosocial stressors includes complicated grief of loss of his wife from pancreatic cancer in December 2018.  Will continue current medication regimen at this time given his symptoms can be situational/to avoid potential impact on his cardiac condition. He has an upcoming appointment by cardiologist.  Will continue nortriptyline to target depression, appetite loss, and insomnia.  Discussed potential risk of drowsiness, cholinergic side effects and palpitation.  We will continue Xanax as needed for anxiety.  Discussed risk of dependence and oversedation. (Patient did have accidental overdose in 09/2017 with xanax and opioid).   Plan I have reviewed and updated plans as below 1.Continuenortriptyline 162m at night  2.Continue Xanax 0.5 mg twice a day as needed for anxiety  3. Next appointment: 9/14  at 2:30 for 20 mins, phone - on oxycodone  Past trials of medication:sertraline,lexapro,mirtazapine (drowsiness),venlafaxine, xanax,  The patient demonstrates the following risk factors for suicide: Chronic risk factors for suicide include:psychiatric disorder ofdepression. Acute risk factorsfor suicide include: loss (financial, interpersonal, professional). Protective factorsfor this patient include: positive social support, coping skills and hope for the future. Considering these factors, the overall suicide risk at  RNorman Clay MD 05/23/2020, 11:39 AM

## 2020-05-16 NOTE — Telephone Encounter (Signed)
Morris Education administrator from [employer] stated she went out to patient house and noted patient face was swollen and his legs swollen. Per Pam she's with the patient right now and he didn't even know his face was swollen. Per Pam she thinks its patient newest med he just started which is his nortriptyline. Staff suggested to Pam to have patient call his Cardiologist and his PCP due to those swelling.

## 2020-05-16 NOTE — Telephone Encounter (Signed)
noted 

## 2020-05-17 DIAGNOSIS — M25512 Pain in left shoulder: Secondary | ICD-10-CM | POA: Diagnosis not present

## 2020-05-17 DIAGNOSIS — I10 Essential (primary) hypertension: Secondary | ICD-10-CM | POA: Diagnosis not present

## 2020-05-17 DIAGNOSIS — R0602 Shortness of breath: Secondary | ICD-10-CM | POA: Diagnosis not present

## 2020-05-17 DIAGNOSIS — F329 Major depressive disorder, single episode, unspecified: Secondary | ICD-10-CM | POA: Diagnosis not present

## 2020-05-17 DIAGNOSIS — R609 Edema, unspecified: Secondary | ICD-10-CM | POA: Diagnosis not present

## 2020-05-17 DIAGNOSIS — Z299 Encounter for prophylactic measures, unspecified: Secondary | ICD-10-CM | POA: Diagnosis not present

## 2020-05-19 DIAGNOSIS — J90 Pleural effusion, not elsewhere classified: Secondary | ICD-10-CM | POA: Diagnosis not present

## 2020-05-19 DIAGNOSIS — I700 Atherosclerosis of aorta: Secondary | ICD-10-CM | POA: Diagnosis not present

## 2020-05-23 ENCOUNTER — Other Ambulatory Visit: Payer: Self-pay | Admitting: *Deleted

## 2020-05-23 ENCOUNTER — Encounter (HOSPITAL_COMMUNITY): Payer: Self-pay | Admitting: Psychiatry

## 2020-05-23 ENCOUNTER — Telehealth (INDEPENDENT_AMBULATORY_CARE_PROVIDER_SITE_OTHER): Payer: Medicare HMO | Admitting: Psychiatry

## 2020-05-23 ENCOUNTER — Other Ambulatory Visit: Payer: Self-pay

## 2020-05-23 DIAGNOSIS — F320 Major depressive disorder, single episode, mild: Secondary | ICD-10-CM

## 2020-05-23 MED ORDER — NORTRIPTYLINE HCL 50 MG PO CAPS: ORAL_CAPSULE | ORAL | 0 refills | Status: DC

## 2020-05-23 MED ORDER — DILTIAZEM HCL ER COATED BEADS 240 MG PO CP24: 240.0000 mg | ORAL_CAPSULE | Freq: Every day | ORAL | 1 refills | Status: DC

## 2020-05-23 MED ORDER — NORTRIPTYLINE HCL 25 MG PO CAPS: ORAL_CAPSULE | ORAL | 0 refills | Status: DC

## 2020-05-23 MED ORDER — ALPRAZOLAM 0.5 MG PO TABS: 0.5000 mg | ORAL_TABLET | Freq: Two times a day (BID) | ORAL | 1 refills | Status: DC | PRN

## 2020-05-23 NOTE — Patient Instructions (Signed)
1.Continuenortriptyline 125mg  at night  2.Continue Xanax 0.5 mg twice a day as needed for anxiety  3. Next appointment: 9/14  at 2:30

## 2020-05-29 ENCOUNTER — Telehealth: Payer: Self-pay | Admitting: Family Medicine

## 2020-05-29 NOTE — Telephone Encounter (Signed)
Patient called stating that he needs someone to help him with his monitor. Has no transporation except for Mondays.  Please call [phone].

## 2020-05-29 NOTE — Telephone Encounter (Signed)
Left message to return call 

## 2020-05-30 ENCOUNTER — Ambulatory Visit: Payer: Medicare HMO

## 2020-05-30 ENCOUNTER — Ambulatory Visit (INDEPENDENT_AMBULATORY_CARE_PROVIDER_SITE_OTHER): Payer: Medicare HMO

## 2020-05-30 DIAGNOSIS — R002 Palpitations: Secondary | ICD-10-CM

## 2020-05-30 DIAGNOSIS — I4892 Unspecified atrial flutter: Secondary | ICD-10-CM | POA: Diagnosis not present

## 2020-05-30 DIAGNOSIS — Z299 Encounter for prophylactic measures, unspecified: Secondary | ICD-10-CM | POA: Diagnosis not present

## 2020-05-30 DIAGNOSIS — I10 Essential (primary) hypertension: Secondary | ICD-10-CM | POA: Diagnosis not present

## 2020-05-30 DIAGNOSIS — R609 Edema, unspecified: Secondary | ICD-10-CM | POA: Diagnosis not present

## 2020-05-30 DIAGNOSIS — J449 Chronic obstructive pulmonary disease, unspecified: Secondary | ICD-10-CM | POA: Diagnosis not present

## 2020-05-31 ENCOUNTER — Telehealth: Payer: Self-pay | Admitting: Family Medicine

## 2020-05-31 DIAGNOSIS — R002 Palpitations: Secondary | ICD-10-CM | POA: Diagnosis not present

## 2020-05-31 DIAGNOSIS — I484 Atypical atrial flutter: Secondary | ICD-10-CM | POA: Diagnosis not present

## 2020-05-31 NOTE — Telephone Encounter (Signed)
Baseline received 05/30/2020

## 2020-05-31 NOTE — Telephone Encounter (Signed)
Pre-cert Verification for the following procedure    LONG TERM MONITOR (3-14 DAYS)

## 2020-06-05 ENCOUNTER — Ambulatory Visit: Payer: Medicare HMO | Admitting: Cardiology

## 2020-06-16 DIAGNOSIS — I2510 Atherosclerotic heart disease of native coronary artery without angina pectoris: Secondary | ICD-10-CM | POA: Diagnosis not present

## 2020-06-16 DIAGNOSIS — J449 Chronic obstructive pulmonary disease, unspecified: Secondary | ICD-10-CM | POA: Diagnosis not present

## 2020-06-18 NOTE — Progress Notes (Signed)
Cardiology Office Note  Date: 06/19/2020   ID: MAGGIE DWORKIN Sr., DOB Oct 23, 1956, MRN [id number]  PCP:  [name], MD  Cardiologist:  Rozann Lesches, MD Electrophysiologist:  None   Chief Complaint: Follow-up coronary artery calcification on CT, HTN, HLD, COPD.  History of Present Illness: Janzen Sacks. is a 64 y.o. male with a history of hypertension, hyperlipidemia, COPD, recent coronary CT with coronary artery calcification.  Last encounter with Dr. Domenic Polite via telemedicine June 22, 2019.  He did not report any definitive anginal symptoms.  He stated back in January 2020 he had an unusual sharp pain in his chest after twisting a certain way, lasted a few days, and has not recurred since then.  He was having some hip problems and had an orthopedic consultation at [facility] pending.  Last visit status post recent hospital visit in March for rapid heart rate.  Patient had initial heart rate of 165 and apparently was given a medication to slow his heart rate down. On Cardizem CD 120 mg/day.  He continued to have breakthrough palpitations and dizziness.  His blood pressure was low normal long-term smoker and has obstructive sleep apnea but not compliant with CPAP due to appliance being uncomfortable.  He denied any other PND, orthopnea, DOE, stroke or TIA-like symptoms, claudication-like symptoms, DVT or PE-like symptoms, or lower extremity edema.  His Cardizem was increased to 180 mg daily.  ZIO monitor was ordered.  His lisinopril/HCTZ was held due to low blood pressures.   He is here for follow-up.  At last visit a ZIO monitor was ordered.  Patient was having difficulty figuring out how to wear the monitor.  He states he called [name] here at the office to see if she could help him put a monitor on.  States he was unable to get a monitor on himself.  He states he went to Dr. Trena Platt office and Dr. Brigitte Pulse and the PA tried to place a monitor on him.  We do not have the results.  He  currently denies any issues other than shortness of breath.  He has a significant history of smoking and continues to smoke.  He has sleep apnea and has been  noncompliant with the CPAP.  He denies any palpitations or arrhythmias, orthostatic symptoms, CVA or TIA-like symptoms, bleeding issues other than bruising of his skin.  He is not taking the Eliquis as directed.  He is only taking it once a day.   Past Medical History:  Diagnosis Date  . Chronic lower back pain   . Collagenous colitis [address] [place]  . COPD (chronic obstructive pulmonary disease) ([place])   . Essential hypertension   . Hyperlipidemia   . MRSA infection   . PUD (peptic ulcer disease)     Past Surgical History:  Procedure Laterality Date  . BACK SURGERY    . BIOPSY  06/03/2017   Procedure: BIOPSY;  Surgeon: Danie Binder, MD;  [facility];  Service: Endoscopy;;  colon  . COLONOSCOPY  2001   Dr. Sharlett Iles, normal  . COLONOSCOPY  04/2013   [facility]: Fentanyl 200mg Danford Bad 10mg : sigmoid diverticulosis, normal terminal ileum, small internal hemorrhoids, random colon bx: collagenous colitis. next tcs 04/2018  . COLONOSCOPY  2011   [facility]: two polyps, sessile serrated adenoma, mild diverticulosis  . COLONOSCOPY WITH PROPOFOL N/A 06/03/2017   Dr. Oneida Alar: Diverticulosis, three 2 to 3 mm polyps removed from the mid transverse colon and cecum,  three 4 to 6 mm polyps in the rectum and mid descending colon removed.  External/internal hemorrhoids.  hyperplastic polyps.  Random colon biopsies negative for microscopic colitis. next colonoscopy in 5 years.  . ELBOW BURSA SURGERY    . ESOPHAGEAL DILATION N/A 05/19/2015   Procedure: ESOPHAGEAL DILATION;  Surgeon: Danie Binder, MD;  [facility];  Service: Endoscopy;  Laterality: N/A;  . ESOPHAGOGASTRODUODENOSCOPY N/A 10/28/2014   [facility]: 1. Esophagitis due to GERD. KOH neg. 2. Small hiatal hernia 3. Moderate erosive gastritis 4. RUQ pain due to large ulcer 5. [place]  duodentis in the bulb.   . ESOPHAGOGASTRODUODENOSCOPY N/A 05/19/2015   [facility]: 1. Mild distal esophagitis 2. Peptic stricture at the gastroesophageal junction 3. Mild non-erosive gastritis 4. Pseudo pylorus due to prior PUD.   Marland Kitchen ESOPHAGOGASTRODUODENOSCOPY N/A 03/07/2017   Dr. Oneida Alar: Benign-appearing esophageal stenosis status post dilation, mild gastritis  . FINGER AMPUTATION Left 1985   index finger  . HYDROCELE EXCISION Right 10/02/2018   Procedure: HYDROCELECTOMY ADULT;  Surgeon: Irine Seal, MD;  Location: [facility];  Service: Urology;  Laterality: Right;  . JOINT REPLACEMENT     left hip surgery - January 26, 2014  . POLYPECTOMY  06/03/2017   Procedure: POLYPECTOMY;  Surgeon: Danie Binder, MD;  [facility];  Service: Endoscopy;;  colon  . ROTATOR CUFF REPAIR     November 2014  . SAVORY DILATION N/A 03/07/2017   Procedure: SAVORY DILATION;  Surgeon: Danie Binder, MD;  [facility];  Service: Endoscopy;  Laterality: N/A;    Current Outpatient Medications  Medication Sig Dispense Refill  . acetaminophen (TYLENOL) 650 MG CR tablet Take 650-1,300 mg by mouth every 8 (eight) hours as needed for pain.    Marland Kitchen albuterol (PROVENTIL HFA;VENTOLIN HFA) 108 (90 BASE) MCG/ACT inhaler Inhale 2 puffs into the lungs See admin instructions. Inhale 1-2 puffs scheduled every morning & every 6 hours as needed for wheezing/shortness of breath.    Marland Kitchen albuterol (PROVENTIL) (2.5 MG/3ML) 0.083% nebulizer solution Take 2.5 mg by nebulization every 6 (six) hours as needed for wheezing or shortness of breath.    . ALPRAZolam (XANAX) 0.5 MG tablet Take 1 tablet (0.5 mg total) by mouth 2 (two) times daily as needed for anxiety or sleep. 60 tablet 1  . apixaban (ELIQUIS) 5 MG TABS tablet Take 5 mg by mouth daily.    Marland Kitchen atorvastatin (LIPITOR) 40 MG tablet Take 40 mg by mouth daily.     . budesonide (ENTOCORT EC) 3 MG 24 hr capsule Take 3 mg by mouth daily.    . budesonide-formoterol (SYMBICORT) 160-4.5  MCG/ACT inhaler Inhale 2 puffs into the lungs 2 (two) times daily as needed (wheezing/shortness of breath.).     Marland Kitchen cholecalciferol (VITAMIN D3) 25 MCG (1000 UNIT) tablet Take 1,000 Units by mouth in the morning, at noon, in the evening, and at bedtime.    Marland Kitchen CLENPIQ 10-3.5-12 MG-GM -GM/160ML SOLN Take 320 mg by mouth once.    . diltiazem (CARDIZEM CD) 240 MG 24 hr capsule Take 1 capsule (240 mg total) by mouth daily. 90 capsule 1  . ELIQUIS 5 MG TABS tablet Take 1 tablet by mouth in the morning and at bedtime.    . Eluxadoline (VIBERZI) 75 MG TABS Take 75 mg by mouth daily.    Marland Kitchen EPINEPHrine 0.3 mg/0.3 mL IJ SOAJ injection Inject 0.3 mg into the muscle as needed (allergic reaction).     . gabapentin (NEURONTIN) 300  MG capsule Take 300 mg by mouth 3 (three) times daily.     Marland Kitchen lisinopril-hydrochlorothiazide (ZESTORETIC) 10-12.5 MG tablet Take 1 tablet by mouth daily. 04/28/2020 HOLD    . nortriptyline (PAMELOR) 25 MG capsule Take 1 capsule along with two 50 mg capsules at night (Total of 125 mg) 90 capsule 0  . nortriptyline (PAMELOR) 50 MG capsule Take 2 capsules along with 25 mg capsule at night (Total 125 mg) 180 capsule 0  . omeprazole (PRILOSEC) 20 MG capsule Take 20 mg by mouth as needed (for heartburn).    Marland Kitchen umeclidinium bromide (INCRUSE ELLIPTA) 62.5 MCG/INH AEPB Inhale 1 puff into the lungs as needed (for wheezing).      No current facility-administered medications for this visit.   Allergies:  Bee venom, Ciprofloxacin, Levaquin [levofloxacin in d5w], Tramadol, Hydrocodone, and Sulfa antibiotics   Social History: The patient  reports that he has been smoking cigarettes. He started smoking about 51 years ago. He has a 20.00 pack-year smoking history. He has never used smokeless tobacco. He reports that he does not drink alcohol and does not use drugs.   Family History: The patient's family history includes Arthritis in his sister; Cancer in his mother; Coronary artery disease in his brother;  Diabetes in an other family member; Diabetes type II in his father; Heart attack in his father and another family member; Stroke in his brother.   ROS:  Please see the history of present illness. Otherwise, complete review of systems is positive for none.  All other systems are reviewed and negative.   Physical Exam: VS:  BP 108/64   Pulse 90   Ht 5\' 10"  (1.778 m)   Wt 196 lb 12.8 oz (89.3 kg)   SpO2 96%   BMI 28.24 kg/m , BMI Body mass index is 28.24 kg/m.  Wt Readings from Last 3 Encounters:  06/19/20 196 lb 12.8 oz (89.3 kg)  04/27/20 184 lb 9.6 oz (83.7 kg)  01/21/20 185 lb (83.9 kg)    General: Patient appears comfortable at rest. Neck: Supple, no elevated JVP or carotid bruits, no thyromegaly. Lungs: Clear to auscultation, nonlabored breathing at rest. Cardiac: Regular rate and rhythm, no S3 or significant systolic murmur, no pericardial rub. Extremities: No pitting edema, distal pulses 2+. Skin: Warm and dry. Musculoskeletal: No kyphosis. Neuropsychiatric: Alert and oriented x3, affect grossly appropriate.  ECG:  An ECG dated 01/26/2020 was personally reviewed today and demonstrated:  Normal sinus rhythm rate of 79, possible left atrial enlargement, right bundle branch block  Recent Labwork: No results found for requested labs within last 8760 hours.  No results found for: CHOL, TRIG, HDL, CHOLHDL, VLDL, LDLCALC, LDLDIRECT  Other Studies Reviewed Today:  Echocardiogram 01/27/2020 Indications : Atrial flutter  Summary  1. The left ventricle is normal in size with normal wall thickness.  2. The left ventricular systolic function is normal, LVEF is visually  estimated at 55-60%.  3. There is mild mitral valve regurgitation.  4. There is mild aortic regurgitation.  5. The left atrium is mildly dilated in size.  6. The right ventricle is upper normal in size, with normal systolic  function.  7. There is mild pulmonary hypertension, estimated pulmonary  artery systolic  pressure is 41 mmHg.  8. The right atrium is mildly dilated in size.      Left Ventricle  The left ventricle is normal in size with normal wall thickness.  The left ventricular systolic function is normal, LVEF is visually estimated  at 55-60%.  There is normal left ventricular diastolic function.    Right Ventricle  The right ventricle is upper normal in size, with normal systolic function.      Left Atrium  The left atrium is mildly dilated in size.    Right Atrium  The right atrium is mildly dilated in size.      Aortic Valve  The aortic valve is trileaflet with normal appearing leaflets with normal  excursion.  There is mild aortic regurgitation.  There is no evidence of a significant transvalvular gradient.    Pulmonic Valve  The pulmonic valve is normal.  There is no significant pulmonic regurgitation.  There is no evidence of a significant transvalvular gradient.    Mitral Valve  The mitral valve leaflets are normal with normal leaflet mobility.  There is mild mitral valve regurgitation.    Tricuspid Valve  The tricuspid valve leaflets are normal, with normal leaflet mobility.  There is mild tricuspid regurgitation.  There is mild pulmonary hypertension, estimated pulmonary artery systolic  pressure is 41 mmHg.      Other Findings  Rhythm: Sinus Rhythm.    Pericardium/Pleural  There is a trivial, posterior pericardial effusion.    Inferior Vena Cava  The IVC is suboptimally visualized but probably suggests normal right atrial  pressure.    Aorta  The aorta is normal in size in the visualized segments.  Lexiscan Myoview 08/08/2015([facility]): No evidence of scar or ischemia with LVEF 59%.  Chest CTA 10/09/2014: IMPRESSION: No evidence of acute abnormality. No evidence of pulmonary emboli or thoracic aortic aneurysm.  Echocardiogram 08/29/2016: Study  Conclusions  - Left ventricle: The cavity size was normal. Wall thickness was increased in a pattern of mild LVH. Systolic function was normal. The estimated ejection fraction was in the range of 60% to 65%. Wall motion was normal; there were no regional wall motion abnormalities. Doppler parameters are consistent with abnormal left ventricular relaxation (grade 1 diastolic dysfunction). - Aortic valve: Trileaflet; mildly calcified leaflets. There was trivial regurgitation. - Mitral valve: There was trivial regurgitation. - Right atrium: Central venous pressure (est): 3 mm Hg. - Tricuspid valve: There was trivial regurgitation. - Pulmonary arteries: Systolic pressure could not be accurately estimated. - Pericardium, extracardiac: There was no pericardial effusion.  Impressions:  - Mild LVH with LVEF 60-65%. Grade 1 diastolic dysfunction. Trivial mitral regurgitation. Mildly sclerotic aortic valve with trivial aortic regurgitation. Trivial tricuspid regurgitation.  Lower extremity arterial Dopplers10/10/2016: Normal ABIs(right 1.3 and left 1.2)with no evidence of segmental lower extremity arterial disease at rest.  Assessment and Plan:   1. Atrial flutter, unspecified type [facility]) Recent hospital visit on 3//2021 for rapid heart rate.  Patient stated he was given a medication twice which reduced his heart rate.  His initial EKG at 1512 on 01/26/2020 showed a wide QRS tachycardia with premature ventricular complexes or fusion complexes rate of 165 with right bundle branch block.  A subsequent EKG later on during that visit at 1740 01/26/2020 showed normal sinus rhythm with a rate of 79, possible left atrial enlargement, right bundle branch block.  He had a subsequent echocardiogram ordered by his primary care physician with indication of atrial flutter.  EF was 55 to 60%. Mild MR, mild AR, PASP 41 mmHg.  At last visit Cardizem dose was increased to 180 mg daily.  A  ZIO monitor was ordered.  We do not have the results of the ZIO monitor.  It appears patient had issues with attempting to  place the monitor and try to get his PCP and his assistant PA to help with placement.  It is possible the ZIO monitor was never working properly.  Cardizem was eventually increased to 240 mg daily.  Heart rate today is 90 and regular.  He denies any palpitations.  Continue Cardizem 240 mg p.o. daily, Eliquis 5 mg p.o. twice daily.  We will attempt to call the company to ascertain if ZIO monitor showed any results or if we need to reapply a monitor.   2. Tobacco abuse Patient is a long-term tobacco user.  Continues to smoke.  Reiterated the fact that he needs to stop due to stimulants inducing tachyarrhythmias.  Not to mention the fact that it increases risk of heart disease, lung disease.  3. Essential hypertension Blood pressure today 108/64.  At last visit lisinopril/HCTZ 10/12.5 was on hold due to low blood pressures.  We will continue to hold or DC as long as blood pressure controlled with other current medications.   4. Mixed hyperlipidemia Continue atorvastatin 40 mg daily.  We will attempt to obtain recent lipid levels from PCP.  5. OSA (obstructive sleep apnea) History of obstructive sleep apnea with CPAP.  Patient has been noncompliant with CPAP.  Discussed the ramifications of not being compliant with CPAP therapy contributing to increased palpitations and arrhythmias.  Patient verbalizes understanding and will attempt to comply with therapy.  Patient states today he will attempt to try to contact the provider of CPAP device and get the extra parts he needs such as the face mask/nasal pillars to restart his CPAP therapy  Medication Adjustments/Labs and Tests Ordered: Current medicines are reviewed at length with the patient today.  Concerns regarding medicines are outlined above.   Disposition: Follow-up with Dr. Domenic Polite or APP 6 months  Signed, Levell July,  NP 06/19/2020 11:58 AM    [facility] [place], [place], [place] [address] Phone: [phone]; Fax: [phone]

## 2020-06-19 ENCOUNTER — Ambulatory Visit (INDEPENDENT_AMBULATORY_CARE_PROVIDER_SITE_OTHER): Payer: Medicare HMO | Admitting: Family Medicine

## 2020-06-19 ENCOUNTER — Encounter: Payer: Self-pay | Admitting: Family Medicine

## 2020-06-19 ENCOUNTER — Other Ambulatory Visit: Payer: Self-pay

## 2020-06-19 VITALS — BP 108/64 | HR 90 | Ht 70.0 in | Wt 196.8 lb

## 2020-06-19 DIAGNOSIS — I4892 Unspecified atrial flutter: Secondary | ICD-10-CM | POA: Diagnosis not present

## 2020-06-19 DIAGNOSIS — I10 Essential (primary) hypertension: Secondary | ICD-10-CM

## 2020-06-19 DIAGNOSIS — E782 Mixed hyperlipidemia: Secondary | ICD-10-CM

## 2020-06-19 DIAGNOSIS — G4733 Obstructive sleep apnea (adult) (pediatric): Secondary | ICD-10-CM | POA: Diagnosis not present

## 2020-06-19 DIAGNOSIS — Z720 Tobacco use: Secondary | ICD-10-CM

## 2020-06-19 NOTE — Patient Instructions (Addendum)
Medication Instructions:  Continue all current medications.   Labwork: none  Testing/Procedures: none  Follow-Up: 6 months   Any Other Special Instructions Will Be Listed Below (If Applicable).   If you need a refill on your cardiac medications before your next appointment, please call your pharmacy.  

## 2020-06-20 ENCOUNTER — Telehealth: Payer: Self-pay | Admitting: Family Medicine

## 2020-06-20 DIAGNOSIS — R0789 Other chest pain: Secondary | ICD-10-CM | POA: Diagnosis not present

## 2020-06-20 DIAGNOSIS — I10 Essential (primary) hypertension: Secondary | ICD-10-CM | POA: Diagnosis not present

## 2020-06-20 DIAGNOSIS — Z743 Need for continuous supervision: Secondary | ICD-10-CM | POA: Diagnosis not present

## 2020-06-20 DIAGNOSIS — W19XXXA Unspecified fall, initial encounter: Secondary | ICD-10-CM | POA: Diagnosis not present

## 2020-06-20 DIAGNOSIS — F17210 Nicotine dependence, cigarettes, uncomplicated: Secondary | ICD-10-CM | POA: Diagnosis not present

## 2020-06-20 DIAGNOSIS — J441 Chronic obstructive pulmonary disease with (acute) exacerbation: Secondary | ICD-10-CM | POA: Diagnosis not present

## 2020-06-20 DIAGNOSIS — S3993XA Unspecified injury of pelvis, initial encounter: Secondary | ICD-10-CM | POA: Diagnosis not present

## 2020-06-20 DIAGNOSIS — R079 Chest pain, unspecified: Secondary | ICD-10-CM | POA: Diagnosis not present

## 2020-06-20 DIAGNOSIS — R0902 Hypoxemia: Secondary | ICD-10-CM | POA: Diagnosis not present

## 2020-06-20 DIAGNOSIS — W1839XA Other fall on same level, initial encounter: Secondary | ICD-10-CM | POA: Diagnosis not present

## 2020-06-20 DIAGNOSIS — E860 Dehydration: Secondary | ICD-10-CM | POA: Diagnosis not present

## 2020-06-20 DIAGNOSIS — R0781 Pleurodynia: Secondary | ICD-10-CM | POA: Diagnosis not present

## 2020-06-20 NOTE — Telephone Encounter (Signed)
Patient called stating that when he left office 06/19/2020 he fell at home. States that he went to [facility]. States now that he is confused about taking a medication.

## 2020-06-22 NOTE — Telephone Encounter (Signed)
Last seen by Katina Dung, NP on 06/19/20 - patient calling to clarify medications.  Stated that he is not taking the Lisinopril / HCTZ - was told to stop this due to low bp's & increased his Diltiazem to 240 daily.  See recent phone notes.

## 2020-06-23 NOTE — Telephone Encounter (Signed)
That is correct he was told to hold the lisinopril/HCTZ for now due to blood pressure being controlled with other medications.  Tell him he needs to continue to hold that medication until we decide whether to stop it or not.  Tell him just to put up in the back of his cabinet where he will take it mistakingly.  We may eventually discontinue it if blood pressure is being managed with the other medications.

## 2020-06-26 NOTE — Telephone Encounter (Signed)
Pt voiced understanding

## 2020-06-27 ENCOUNTER — Ambulatory Visit: Payer: Medicare HMO | Admitting: Gastroenterology

## 2020-07-04 NOTE — Addendum Note (Signed)
Addended by: Merlene Laughter on: 07/04/2020 10:53 AM   Modules accepted: Orders

## 2020-07-05 ENCOUNTER — Telehealth: Payer: Self-pay | Admitting: *Deleted

## 2020-07-05 NOTE — Telephone Encounter (Signed)
Patient informed. Copy sent to PCP °

## 2020-07-05 NOTE — Telephone Encounter (Signed)
Verta Ellen., NP  Merlene Laughter, RN Okay I see the ZIO monitor is back for Garrett Klein for his palpitations. It appears his overall heart rate showed a minimum heart rate of 58 and a maximum of 156 with an average of 87. No episodes of ventricular tachycardia, pauses, AV block, or atrial fibrillation were found. The primary rhythm was sinus rhythm he had some isolated premature supraventricular beats which were rare. No significant tachycardia or bradycardia arrhythmias were seen. Please call him and let him know. Thank you

## 2020-07-10 ENCOUNTER — Telehealth: Payer: Self-pay | Admitting: Family Medicine

## 2020-07-10 NOTE — Telephone Encounter (Signed)
Informed patient that Lisinopril had been put on hold due to other medications managing his BP.  Per phone note on 06/20/2020, he was instructed to hold for now till reassess BP readings.  If BP under control, may decide to d/c permanently.  Stated that he has not been checking his readings since his last ov on 06/19/2020.  Advised him to log BP & HR readings for 10-14 days & call office with readings or drop log off for provider review.  He verbalized understanding.

## 2020-07-10 NOTE — Telephone Encounter (Signed)
Patient called wanting to know if he needs to start back taking Lisinopril.

## 2020-07-27 NOTE — Progress Notes (Signed)
Virtual Visit via Telephone Note  I connected with Garrett STASH Sr. on 08/03/20 at  3:00 PM EDT by telephone and verified that I am speaking with the correct person using two identifiers.   I discussed the limitations, risks, security and privacy concerns of performing an evaluation and management service by telephone and the availability of in person appointments. I also discussed with the patient that there may be a patient responsible charge related to this service. The patient expressed understanding and agreed to proceed.  I discussed the assessment and treatment plan with the patient. The patient was provided an opportunity to ask questions and all were answered. The patient agreed with the plan and demonstrated an understanding of the instructions.   The patient was advised to call back or seek an in-person evaluation if the symptoms worsen or if the condition fails to improve as anticipated.  Location: patient- home, provider- home office   I provided 12 minutes of non-face-to-face time during this encounter.   [name], MD    [facility] MD/PA/NP OP Progress Note  08/03/2020 3:20 PM Garrett Salinas Sr.  MRN:  [id number]  Chief Complaint:  Chief Complaint    Depression; Follow-up     HPI:  This is a follow-up appointment for depression.  He states that he is taking it day by day.  He reports his frustration of not being able to go outside as much as he wishes as he does not have a car.  He transmission came out of the car, and he could not fix it due to financial strain.  He also has difficulty in taking a walk due to hip replacement.  However, he reports good relationship with his sister, who he contacts very frequently.  He has better appetite since he has tried Meals on [employer].  He is trying to get in contact more frequently with church members.  He wonders why he still feels the loss happened yesterday, referring to the loss of his wife. He agrees that he has been able to do  things more while he misses her.  He has middle insomnia.  He feels down at times.  He has fair energy and concentration.  He denies SI.  He feels anxious at times.   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 His wife deceased in Nov 12, 2017 Number of children:  2 sons  Visit Diagnosis:    ICD-10-CM   1. Current mild episode of major depressive disorder without prior episode (HCC)  F32.0 ALPRAZolam (XANAX) 0.5 MG tablet    Past Psychiatric History: Please see initial evaluation for full details. I have reviewed the history. No updates at this time.     Past Medical History:  Past Medical History:  Diagnosis Date  . Chronic lower back pain   . Collagenous colitis [address] [place]  . COPD (chronic obstructive pulmonary disease) ([place])   . Essential hypertension   . Hyperlipidemia   . MRSA infection   . PUD (peptic ulcer disease)     Past Surgical History:  Procedure Laterality Date  . BACK SURGERY    . BIOPSY  06/03/2017   Procedure: BIOPSY;  Surgeon: Danie Binder, MD;  [facility];  Service: Endoscopy;;  colon  . COLONOSCOPY  2001   Dr. Sharlett Iles, normal  . COLONOSCOPY  04/2013  [place]: Fentanyl 200mg /versed 10mg : sigmoid diverticulosis, normal terminal ileum, small internal hemorrhoids, random colon bx: collagenous colitis. next tcs 04/2018  . COLONOSCOPY  2011   [facility]: two polyps, sessile serrated adenoma, mild diverticulosis  . COLONOSCOPY WITH PROPOFOL N/A 06/03/2017   Dr. Oneida Alar: Diverticulosis, three 2 to 3 mm polyps removed from the mid transverse colon and cecum, three 4 to 6 mm polyps in the rectum and mid descending colon removed.  External/internal hemorrhoids.  hyperplastic polyps.  Random colon biopsies negative for microscopic colitis. next colonoscopy in 5 years.  . ELBOW BURSA  SURGERY    . ESOPHAGEAL DILATION N/A 05/19/2015   Procedure: ESOPHAGEAL DILATION;  Surgeon: Danie Binder, MD;  [facility];  Service: Endoscopy;  Laterality: N/A;  . ESOPHAGOGASTRODUODENOSCOPY N/A 10/28/2014   [facility]: 1. Esophagitis due to GERD. KOH neg. 2. Small hiatal hernia 3. Moderate erosive gastritis 4. RUQ pain due to large ulcer 5. [place] duodentis in the bulb.   . ESOPHAGOGASTRODUODENOSCOPY N/A 05/19/2015   [facility]: 1. Mild distal esophagitis 2. Peptic stricture at the gastroesophageal junction 3. Mild non-erosive gastritis 4. Pseudo pylorus due to prior PUD.   Marland Kitchen ESOPHAGOGASTRODUODENOSCOPY N/A 03/07/2017   Dr. Oneida Alar: Benign-appearing esophageal stenosis status post dilation, mild gastritis  . FINGER AMPUTATION Left 1985   index finger  . HYDROCELE EXCISION Right 10/02/2018   Procedure: HYDROCELECTOMY ADULT;  Surgeon: Irine Seal, MD;  Location: [facility];  Service: Urology;  Laterality: Right;  . JOINT REPLACEMENT     left hip surgery - January 26, 2014  . POLYPECTOMY  06/03/2017   Procedure: POLYPECTOMY;  Surgeon: Danie Binder, MD;  [facility];  Service: Endoscopy;;  colon  . ROTATOR CUFF REPAIR     November 2014  . SAVORY DILATION N/A 03/07/2017   Procedure: SAVORY DILATION;  Surgeon: Danie Binder, MD;  [facility];  Service: Endoscopy;  Laterality: N/A;    Family Psychiatric History: Please see initial evaluation for full details. I have reviewed the history. No updates at this time.     Family History:  Family History  Problem Relation Age of Onset  . Cancer Mother        ovarian  . Diabetes type II Father   . Heart attack Father   . Arthritis Sister   . Stroke Brother   . Coronary artery disease Brother   . Diabetes Other   . Heart attack Other   . Colon cancer Neg Hx   . Liver disease Neg Hx   . Ulcers Neg Hx     Social History:  Social History   Socioeconomic History  . Marital status: Widowed    Spouse name: Not on file  .  Number of children: Not on file  . Years of education: Not on file  . Highest education level: Not on file  Occupational History  . Not on file  Tobacco Use  . Smoking status: Current Every Day Smoker    Packs/day: 0.50    Years: 40.00    Pack years: 20.00    Types: Cigarettes    Start date: 01/09/1969  . Smokeless tobacco: Never Used  Vaping Use  . Vaping Use: Never used  Substance and Sexual Activity  . Alcohol use: No    Alcohol/week: 0.0 standard drinks  . Drug use: No  . Sexual activity: Not on file  Other Topics Concern  . Not on file  Social History Narrative  . Not on  file   Social Determinants of Health   Financial Resource Strain:   . Difficulty of Paying Living Expenses: Not on file  Food Insecurity:   . Worried About Charity fundraiser in the Last Year: Not on file  . Ran Out of Food in the Last Year: Not on file  Transportation Needs:   . Lack of Transportation (Medical): Not on file  . Lack of Transportation (Non-Medical): Not on file  Physical Activity:   . Days of Exercise per Week: Not on file  . Minutes of Exercise per Session: Not on file  Stress:   . Feeling of Stress : Not on file  Social Connections:   . Frequency of Communication with Friends and Family: Not on file  . Frequency of Social Gatherings with Friends and Family: Not on file  . Attends Religious Services: Not on file  . Active Member of Clubs or Organizations: Not on file  . Attends Archivist Meetings: Not on file  . Marital Status: Not on file    Allergies:  Allergies  Allergen Reactions  . Bee Venom Swelling  . Ciprofloxacin Other (See Comments)    Turns red from the chest up.  [place] [Levofloxacin In D5w] Other (See Comments)    CANDIDA ALL OVER MOUTH TO PENIS  . Tramadol Hives       . Hydrocodone Rash  . Sulfa Antibiotics Rash    Metabolic Disorder Labs: No results found for: HGBA1C, MPG No results found for: PROLACTIN No results found for: CHOL,  TRIG, HDL, CHOLHDL, VLDL, LDLCALC Lab Results  Component Value Date   TSH 1.27 09/15/2018   TSH 0.829 08/18/2015    Therapeutic Level Labs: No results found for: LITHIUM No results found for: VALPROATE No components found for:  CBMZ  Current Medications: Current Outpatient Medications  Medication Sig Dispense Refill  . acetaminophen (TYLENOL) 650 MG CR tablet Take 650-1,300 mg by mouth every 8 (eight) hours as needed for pain.    Marland Kitchen albuterol (PROVENTIL HFA;VENTOLIN HFA) 108 (90 BASE) MCG/ACT inhaler Inhale 2 puffs into the lungs See admin instructions. Inhale 1-2 puffs scheduled every morning & every 6 hours as needed for wheezing/shortness of breath.    Marland Kitchen albuterol (PROVENTIL) (2.5 MG/3ML) 0.083% nebulizer solution Take 2.5 mg by nebulization every 6 (six) hours as needed for wheezing or shortness of breath.    Derrill Memo ON 08/11/2020] ALPRAZolam (XANAX) 0.5 MG tablet Take 1 tablet (0.5 mg total) by mouth 2 (two) times daily as needed for anxiety or sleep. 60 tablet 1  . apixaban (ELIQUIS) 5 MG TABS tablet Take 5 mg by mouth daily.    Marland Kitchen atorvastatin (LIPITOR) 40 MG tablet Take 40 mg by mouth daily.     . budesonide (ENTOCORT EC) 3 MG 24 hr capsule Take 3 mg by mouth daily.    . budesonide-formoterol (SYMBICORT) 160-4.5 MCG/ACT inhaler Inhale 2 puffs into the lungs 2 (two) times daily as needed (wheezing/shortness of breath.).     Marland Kitchen cholecalciferol (VITAMIN D3) 25 MCG (1000 UNIT) tablet Take 1,000 Units by mouth in the morning, at noon, in the evening, and at bedtime.    Marland Kitchen CLENPIQ 10-3.5-12 MG-GM -GM/160ML SOLN Take 320 mg by mouth once.    . diltiazem (CARDIZEM CD) 240 MG 24 hr capsule Take 1 capsule (240 mg total) by mouth daily. 90 capsule 1  . ELIQUIS 5 MG TABS tablet Take 1 tablet by mouth in the morning and at bedtime.    Marland Kitchen  Eluxadoline (VIBERZI) 75 MG TABS Take 75 mg by mouth daily.    Marland Kitchen EPINEPHrine 0.3 mg/0.3 mL IJ SOAJ injection Inject 0.3 mg into the muscle as needed (allergic  reaction).     . gabapentin (NEURONTIN) 300 MG capsule Take 300 mg by mouth 3 (three) times daily.     Derrill Memo ON 08/21/2020] nortriptyline (PAMELOR) 25 MG capsule Take 1 capsule along with two 50 mg capsules at night (Total of 125 mg) 90 capsule 0  . [START ON 08/24/2020] nortriptyline (PAMELOR) 50 MG capsule Take 2 capsules along with 25 mg capsule at night (Total 125 mg) 180 capsule 0  . omeprazole (PRILOSEC) 20 MG capsule Take 20 mg by mouth as needed (for heartburn).    Marland Kitchen umeclidinium bromide (INCRUSE ELLIPTA) 62.5 MCG/INH AEPB Inhale 1 puff into the lungs as needed (for wheezing).      No current facility-administered medications for this visit.     Musculoskeletal: Strength & Muscle Tone: N/A Gait & Station: N/A Patient leans: N/A  Psychiatric Specialty Exam: Review of Systems  There were no vitals taken for this visit.There is no height or weight on file to calculate BMI.  General Appearance: NA  Eye Contact:  NA  Speech:  Clear and Coherent  Volume:  Normal  Mood:  good  Affect:  NA  Thought Process:  Coherent  Orientation:  Full (Time, Place, and Person)  Thought Content: Logical   Suicidal Thoughts:  No  Homicidal Thoughts:  No  Memory:  Immediate;   Good  Judgement:  Good  Insight:  Good  Psychomotor Activity:  Normal  Concentration:  Concentration: Good and Attention Span: Good  Recall:  Good  Fund of Knowledge: Good  Language: Good  Akathisia:  No  Handed:  Right  AIMS (if indicated): not done  Assets:  Communication Skills Desire for Improvement  ADL's:  Intact  Cognition: WNL  Sleep:  Fair   Screenings:   Assessment and Plan:  Garrett WHETSEL Sr. is a 64 y.o. year old male with a history of  depression, hypertension,Atrial flutter, OSA not adherent to CPAP, history of hyperlipidemia,collagenous colitis, who presents for follow up appointment for below.    1. Current mild episode of major depressive disorder without prior episode ([place]) There has  been overall improvement in depressive symptoms and anxiety in the context of complicated grief of loss of his wife from pancreatic cancer in December 2018.  Other psychosocial stressors includes medical condition of a flutter.  Will continue nortriptyline to target depression, appetite loss and insomnia.  Discussed potential risk of drowsiness, cholinergic side effects and palpitation.  Will continue Xanax as needed for anxiety.  He is aware of its potential risk of drowsiness and dependence. (Patient did have accidental overdose in 09/2017 with xanax and opioid).  Plan I have reviewed and updated plans as below 1.Continuenortriptyline 125mg  at night  2.Continue Xanax 0.5 mg twice a day as needed for anxiety  3. Next appointment: 12/9 at 1:20 for 20 mins, phone - on oxycodone I have utilized the [place] Controlled Substances Reporting System (PMP AWARxE) to confirm adherence regarding the patient's medication. My review reveals appropriate prescription fills.   Past trials of medication:sertraline,lexapro,mirtazapine (drowsiness),venlafaxine, xanax,  The patient demonstrates the following risk factors for suicide: Chronic risk factors for suicide include:psychiatric disorder ofdepression. Acute risk factorsfor suicide include: loss (financial, interpersonal, professional). Protective factorsfor this patient include: positive social support, coping skills and hope for the future. Considering these factors, the overall  suicide risk at   [name], MD 08/03/2020, 3:20 PM

## 2020-08-01 ENCOUNTER — Telehealth (HOSPITAL_COMMUNITY): Payer: Medicare HMO | Admitting: Psychiatry

## 2020-08-03 ENCOUNTER — Other Ambulatory Visit: Payer: Self-pay

## 2020-08-03 ENCOUNTER — Telehealth (INDEPENDENT_AMBULATORY_CARE_PROVIDER_SITE_OTHER): Payer: Medicare HMO | Admitting: Psychiatry

## 2020-08-03 ENCOUNTER — Encounter (HOSPITAL_COMMUNITY): Payer: Self-pay | Admitting: Psychiatry

## 2020-08-03 DIAGNOSIS — F320 Major depressive disorder, single episode, mild: Secondary | ICD-10-CM | POA: Diagnosis not present

## 2020-08-03 MED ORDER — NORTRIPTYLINE HCL 25 MG PO CAPS: ORAL_CAPSULE | ORAL | 0 refills | Status: DC

## 2020-08-03 MED ORDER — ALPRAZOLAM 0.5 MG PO TABS: 0.5000 mg | ORAL_TABLET | Freq: Two times a day (BID) | ORAL | 1 refills | Status: DC | PRN

## 2020-08-03 MED ORDER — NORTRIPTYLINE HCL 50 MG PO CAPS: ORAL_CAPSULE | ORAL | 0 refills | Status: DC

## 2020-08-10 ENCOUNTER — Ambulatory Visit: Payer: Medicare HMO | Admitting: Cardiology

## 2020-08-17 ENCOUNTER — Telehealth: Payer: Self-pay | Admitting: Family Medicine

## 2020-08-17 DIAGNOSIS — J449 Chronic obstructive pulmonary disease, unspecified: Secondary | ICD-10-CM | POA: Diagnosis not present

## 2020-08-17 DIAGNOSIS — I2510 Atherosclerotic heart disease of native coronary artery without angina pectoris: Secondary | ICD-10-CM | POA: Diagnosis not present

## 2020-08-17 NOTE — Telephone Encounter (Signed)
Reports he picked up Hydrochlorothiazide 12.5 mg from the pharmacy and is unsure if he should be taking this. Says that it is prescribed in Dr. Trena Platt name. Advised that we did not have hctz listed on his medication profile and that he should check with their office if he needed to be on it. Verbalized understanding.

## 2020-08-17 NOTE — Telephone Encounter (Signed)
Patient called stating that he went to [facility]) yesterday to pick up medications. Was given a Medication that he states was not told to take.

## 2020-08-19 DIAGNOSIS — R069 Unspecified abnormalities of breathing: Secondary | ICD-10-CM | POA: Diagnosis not present

## 2020-08-19 DIAGNOSIS — F17200 Nicotine dependence, unspecified, uncomplicated: Secondary | ICD-10-CM | POA: Diagnosis not present

## 2020-08-19 DIAGNOSIS — R600 Localized edema: Secondary | ICD-10-CM | POA: Diagnosis not present

## 2020-08-19 DIAGNOSIS — S199XXA Unspecified injury of neck, initial encounter: Secondary | ICD-10-CM | POA: Diagnosis not present

## 2020-08-19 DIAGNOSIS — R7881 Bacteremia: Secondary | ICD-10-CM | POA: Diagnosis not present

## 2020-08-19 DIAGNOSIS — E7800 Pure hypercholesterolemia, unspecified: Secondary | ICD-10-CM | POA: Diagnosis not present

## 2020-08-19 DIAGNOSIS — Z96642 Presence of left artificial hip joint: Secondary | ICD-10-CM | POA: Diagnosis not present

## 2020-08-19 DIAGNOSIS — S6991XA Unspecified injury of right wrist, hand and finger(s), initial encounter: Secondary | ICD-10-CM | POA: Diagnosis not present

## 2020-08-19 DIAGNOSIS — G4730 Sleep apnea, unspecified: Secondary | ICD-10-CM | POA: Diagnosis not present

## 2020-08-19 DIAGNOSIS — E785 Hyperlipidemia, unspecified: Secondary | ICD-10-CM | POA: Diagnosis not present

## 2020-08-19 DIAGNOSIS — I6381 Other cerebral infarction due to occlusion or stenosis of small artery: Secondary | ICD-10-CM | POA: Diagnosis not present

## 2020-08-19 DIAGNOSIS — F1320 Sedative, hypnotic or anxiolytic dependence, uncomplicated: Secondary | ICD-10-CM | POA: Insufficient documentation

## 2020-08-19 DIAGNOSIS — I10 Essential (primary) hypertension: Secondary | ICD-10-CM | POA: Diagnosis not present

## 2020-08-19 DIAGNOSIS — R404 Transient alteration of awareness: Secondary | ICD-10-CM | POA: Diagnosis not present

## 2020-08-19 DIAGNOSIS — J441 Chronic obstructive pulmonary disease with (acute) exacerbation: Secondary | ICD-10-CM | POA: Diagnosis not present

## 2020-08-19 DIAGNOSIS — R4182 Altered mental status, unspecified: Secondary | ICD-10-CM | POA: Insufficient documentation

## 2020-08-19 DIAGNOSIS — Z7951 Long term (current) use of inhaled steroids: Secondary | ICD-10-CM | POA: Diagnosis not present

## 2020-08-19 DIAGNOSIS — Z880 Allergy status to penicillin: Secondary | ICD-10-CM | POA: Diagnosis not present

## 2020-08-19 DIAGNOSIS — Z882 Allergy status to sulfonamides status: Secondary | ICD-10-CM | POA: Diagnosis not present

## 2020-08-19 DIAGNOSIS — I7090 Unspecified atherosclerosis: Secondary | ICD-10-CM | POA: Diagnosis not present

## 2020-08-19 DIAGNOSIS — M1711 Unilateral primary osteoarthritis, right knee: Secondary | ICD-10-CM | POA: Diagnosis not present

## 2020-08-19 DIAGNOSIS — S0003XA Contusion of scalp, initial encounter: Secondary | ICD-10-CM | POA: Diagnosis not present

## 2020-08-19 DIAGNOSIS — M25461 Effusion, right knee: Secondary | ICD-10-CM | POA: Diagnosis not present

## 2020-08-19 DIAGNOSIS — I6529 Occlusion and stenosis of unspecified carotid artery: Secondary | ICD-10-CM | POA: Diagnosis not present

## 2020-08-19 DIAGNOSIS — F1020 Alcohol dependence, uncomplicated: Secondary | ICD-10-CM | POA: Insufficient documentation

## 2020-08-19 DIAGNOSIS — I480 Paroxysmal atrial fibrillation: Secondary | ICD-10-CM | POA: Diagnosis not present

## 2020-08-19 DIAGNOSIS — R0902 Hypoxemia: Secondary | ICD-10-CM | POA: Diagnosis not present

## 2020-08-19 DIAGNOSIS — D72829 Elevated white blood cell count, unspecified: Secondary | ICD-10-CM | POA: Diagnosis not present

## 2020-08-19 DIAGNOSIS — M19031 Primary osteoarthritis, right wrist: Secondary | ICD-10-CM | POA: Diagnosis not present

## 2020-08-19 DIAGNOSIS — Z743 Need for continuous supervision: Secondary | ICD-10-CM | POA: Diagnosis not present

## 2020-08-20 DIAGNOSIS — I6529 Occlusion and stenosis of unspecified carotid artery: Secondary | ICD-10-CM | POA: Diagnosis not present

## 2020-08-20 DIAGNOSIS — S0003XA Contusion of scalp, initial encounter: Secondary | ICD-10-CM | POA: Diagnosis not present

## 2020-08-20 DIAGNOSIS — J441 Chronic obstructive pulmonary disease with (acute) exacerbation: Secondary | ICD-10-CM | POA: Diagnosis not present

## 2020-08-20 DIAGNOSIS — S199XXA Unspecified injury of neck, initial encounter: Secondary | ICD-10-CM | POA: Diagnosis not present

## 2020-08-20 DIAGNOSIS — E7800 Pure hypercholesterolemia, unspecified: Secondary | ICD-10-CM | POA: Diagnosis not present

## 2020-08-20 DIAGNOSIS — I7090 Unspecified atherosclerosis: Secondary | ICD-10-CM | POA: Diagnosis not present

## 2020-08-20 DIAGNOSIS — Z8679 Personal history of other diseases of the circulatory system: Secondary | ICD-10-CM | POA: Insufficient documentation

## 2020-08-20 DIAGNOSIS — I6381 Other cerebral infarction due to occlusion or stenosis of small artery: Secondary | ICD-10-CM | POA: Diagnosis not present

## 2020-08-21 DIAGNOSIS — S6991XA Unspecified injury of right wrist, hand and finger(s), initial encounter: Secondary | ICD-10-CM | POA: Diagnosis not present

## 2020-08-21 DIAGNOSIS — R600 Localized edema: Secondary | ICD-10-CM | POA: Diagnosis not present

## 2020-08-21 DIAGNOSIS — M1711 Unilateral primary osteoarthritis, right knee: Secondary | ICD-10-CM | POA: Diagnosis not present

## 2020-08-21 DIAGNOSIS — M25461 Effusion, right knee: Secondary | ICD-10-CM | POA: Diagnosis not present

## 2020-08-21 DIAGNOSIS — M19031 Primary osteoarthritis, right wrist: Secondary | ICD-10-CM | POA: Diagnosis not present

## 2020-08-26 DIAGNOSIS — D72829 Elevated white blood cell count, unspecified: Secondary | ICD-10-CM | POA: Diagnosis not present

## 2020-08-27 DIAGNOSIS — R7881 Bacteremia: Secondary | ICD-10-CM | POA: Diagnosis not present

## 2020-09-07 ENCOUNTER — Emergency Department (HOSPITAL_COMMUNITY)
Admission: EM | Admit: 2020-09-07 | Discharge: 2020-09-07 | Disposition: A | Discharge status: Home / Self Care | Payer: No Typology Code available for payment source | Attending: Emergency Medicine | Admitting: Emergency Medicine

## 2020-09-07 ENCOUNTER — Emergency Department (HOSPITAL_COMMUNITY): Payer: No Typology Code available for payment source

## 2020-09-07 ENCOUNTER — Other Ambulatory Visit: Payer: Self-pay

## 2020-09-07 ENCOUNTER — Encounter (HOSPITAL_COMMUNITY): Payer: Self-pay | Admitting: *Deleted

## 2020-09-07 DIAGNOSIS — I10 Essential (primary) hypertension: Secondary | ICD-10-CM | POA: Insufficient documentation

## 2020-09-07 DIAGNOSIS — J449 Chronic obstructive pulmonary disease, unspecified: Secondary | ICD-10-CM | POA: Diagnosis not present

## 2020-09-07 DIAGNOSIS — Z7901 Long term (current) use of anticoagulants: Secondary | ICD-10-CM | POA: Insufficient documentation

## 2020-09-07 DIAGNOSIS — S4991XA Unspecified injury of right shoulder and upper arm, initial encounter: Secondary | ICD-10-CM | POA: Diagnosis not present

## 2020-09-07 DIAGNOSIS — F17210 Nicotine dependence, cigarettes, uncomplicated: Secondary | ICD-10-CM | POA: Diagnosis not present

## 2020-09-07 DIAGNOSIS — Z79899 Other long term (current) drug therapy: Secondary | ICD-10-CM | POA: Insufficient documentation

## 2020-09-07 DIAGNOSIS — L259 Unspecified contact dermatitis, unspecified cause: Secondary | ICD-10-CM | POA: Diagnosis not present

## 2020-09-07 LAB — CBC WITH DIFFERENTIAL/PLATELET
Abs Immature Granulocytes: 0.05 10*3/uL (ref 0.00–0.07)
Basophils Absolute: 0 10*3/uL (ref 0.0–0.1)
Basophils Relative: 0 %
Eosinophils Absolute: 0.1 10*3/uL (ref 0.0–0.5)
Eosinophils Relative: 1 %
HCT: 41 % (ref 39.0–52.0)
Hemoglobin: 12.9 g/dL — ABNORMAL LOW (ref 13.0–17.0)
Immature Granulocytes: 1 %
Lymphocytes Relative: 11 %
Lymphs Abs: 0.9 10*3/uL (ref 0.7–4.0)
MCH: 31.2 pg (ref 26.0–34.0)
MCHC: 31.5 g/dL (ref 30.0–36.0)
MCV: 99.3 fL (ref 80.0–100.0)
Monocytes Absolute: 0.7 10*3/uL (ref 0.1–1.0)
Monocytes Relative: 8 %
Neutro Abs: 6.3 10*3/uL (ref 1.7–7.7)
Neutrophils Relative %: 79 %
Platelets: 281 10*3/uL (ref 150–400)
RBC: 4.13 MIL/uL — ABNORMAL LOW (ref 4.22–5.81)
RDW: 16.1 % — ABNORMAL HIGH (ref 11.5–15.5)
WBC: 8 10*3/uL (ref 4.0–10.5)
nRBC: 0 % (ref 0.0–0.2)

## 2020-09-07 LAB — BASIC METABOLIC PANEL
Anion gap: 9 (ref 5–15)
BUN: 8 mg/dL (ref 8–23)
CO2: 28 mmol/L (ref 22–32)
Calcium: 8.7 mg/dL — ABNORMAL LOW (ref 8.9–10.3)
Chloride: 102 mmol/L (ref 98–111)
Creatinine, Ser: 0.69 mg/dL (ref 0.61–1.24)
GFR, Estimated: >60 mL/min (ref >60)
Glucose, Bld: 119 mg/dL — ABNORMAL HIGH (ref 70–99)
Potassium: 4 mmol/L (ref 3.5–5.1)
Sodium: 139 mmol/L (ref 135–145)

## 2020-09-07 LAB — BRAIN NATRIURETIC PEPTIDE: B Natriuretic Peptide: 89 pg/mL (ref 0.0–100.0)

## 2020-09-07 MED ORDER — DOXYCYCLINE HYCLATE 100 MG PO CAPS: 100.0000 mg | ORAL_CAPSULE | Freq: Two times a day (BID) | ORAL | 0 refills | Status: AC

## 2020-09-07 MED ORDER — PREDNISONE 20 MG PO TABS: ORAL_TABLET | ORAL | 0 refills | Status: AC

## 2020-09-07 NOTE — ED Triage Notes (Signed)
Pt with multiple boils that started on right arm on 10/12.  Recent admission at [facility] and was discharged on 10/12.  Pt passed out 3 weeks and sent to [facility] with elevated HR. Pt is poor historian and unable to state his condition. Pt also with swelling to bilateral legs x 2 days.

## 2020-09-07 NOTE — Discharge Instructions (Addendum)
Your work-up today was likely due to possible poison ivy or infection from plant exposure.  Please take the antibiotic as prescribed until finished.  I also prescribed a course of steroids if this is poison ivy.  He also have evidence of a ruptured biceps tendon, please follow-up with Dr. Aline Brochure with orthopedics for further evaluation.  May take Tylenol or ibuprofen for pain.  Return to the ER for any new or worsening symptoms.

## 2020-09-07 NOTE — ED Provider Notes (Signed)
[facility] Provider Note   CSN: [id number] Arrival date & time: 09/07/20  1312     History Chief Complaint  Patient presents with  . Recurrent Skin Infections    Garrett SAMAD Sr. is a 64 y.o. male.  HPI 64 year old male with a history of hypertension, hyperlipidemia, PUD to the ER with complaints of development of boils on his arms and legs which happened approximately 2 days ago.  Patient states that he was out clearing somewhat in shortly after started to develop boils that would pop and drain free fluid.  He denies any chest pain, shortness of breath, fevers or chills.  Does not have a history of this.  He also complains of feeling a pop in his left bicep after lifting a milk jug yesterday.  He noticed a lump in his left bicep. Denies any numbness or tingling to his extremity. Denies any IV drug use.     Past Medical History:  Diagnosis Date  . Chronic lower back pain   . Collagenous colitis [address] [place]  . COPD (chronic obstructive pulmonary disease) ([place])   . Essential hypertension   . Hyperlipidemia   . MRSA infection   . PUD (peptic ulcer disease)     Patient Active Problem List   Diagnosis Date Noted  . Current mild episode of major depressive disorder without prior episode ([place]) 12/23/2019  . Hip pain, bilateral 09/01/2019  . Melena 08/13/2018  . Chronic diarrhea 08/13/2018  . Current moderate episode of major depressive disorder without prior episode ([place]) 05/05/2018  . Feeling grief 03/17/2018  . Abdominal pain in male 06/04/2017  . Positive FIT (fecal immunochemical test)   . Serrated adenoma of colon 04/10/2017  . Coronary artery calcification 08/09/2016  . Essential hypertension 08/09/2016  . Anal skin tag 08/23/2015  . Erosive esophagitis 05/05/2015  . PUD (peptic ulcer disease) 05/05/2015  . Collagenous colitis 05/05/2015  . Dysphagia 05/05/2015  . Abdominal pain, epigastric 10/26/2014  . HCAP (healthcare-associated  pneumonia) 04/23/2014  . Arthritis 04/23/2014  . COPD (chronic obstructive pulmonary disease) ([place]) 04/23/2014  . Tobacco abuse 04/23/2014  . Hypercholesterolemia 04/23/2014  . Pneumococcal pneumonia ([place]) 04/23/2014  . Bursitis of elbow     Past Surgical History:  Procedure Laterality Date  . BACK SURGERY    . BIOPSY  06/03/2017   Procedure: BIOPSY;  Surgeon: Danie Binder, MD;  Location: [facility];  Service: Endoscopy;;  colon  . COLONOSCOPY  2001   Dr. Sharlett Iles, normal  . COLONOSCOPY  04/2013   [facility]: Fentanyl 200mg Danford Bad 10mg : sigmoid diverticulosis, normal terminal ileum, small internal hemorrhoids, random colon bx: collagenous colitis. next tcs 04/2018  . COLONOSCOPY  2011   [facility]: two polyps, sessile serrated adenoma, mild diverticulosis  . COLONOSCOPY WITH PROPOFOL N/A 06/03/2017   Dr. Oneida Alar: Diverticulosis, three 2 to 3 mm polyps removed from the mid transverse colon and cecum, three 4 to 6 mm polyps in the rectum and mid descending colon removed.  External/internal hemorrhoids.  hyperplastic polyps.  Random colon biopsies negative for microscopic colitis. next colonoscopy in 5 years.  . ELBOW BURSA SURGERY    . ESOPHAGEAL DILATION N/A 05/19/2015   Procedure: ESOPHAGEAL DILATION;  Surgeon: Danie Binder, MD;  Location: [facility];  Service: Endoscopy;  Laterality: N/A;  . ESOPHAGOGASTRODUODENOSCOPY N/A 10/28/2014   [facility]: 1. Esophagitis due to GERD. KOH neg. 2. Small hiatal hernia 3. Moderate erosive gastritis 4. RUQ pain due to large  ulcer 5. [place] duodentis in the bulb.   . ESOPHAGOGASTRODUODENOSCOPY N/A 05/19/2015   [facility]: 1. Mild distal esophagitis 2. Peptic stricture at the gastroesophageal junction 3. Mild non-erosive gastritis 4. Pseudo pylorus due to prior PUD.   Marland Kitchen ESOPHAGOGASTRODUODENOSCOPY N/A 03/07/2017   Dr. Oneida Alar: Benign-appearing esophageal stenosis status post dilation, mild gastritis  . FINGER AMPUTATION Left 1985   index finger  . HYDROCELE EXCISION  Right 10/02/2018   Procedure: HYDROCELECTOMY ADULT;  Surgeon: Irine Seal, MD;  Location: [facility];  Service: Urology;  Laterality: Right;  . JOINT REPLACEMENT     left hip surgery - January 26, 2014  . POLYPECTOMY  06/03/2017   Procedure: POLYPECTOMY;  Surgeon: Danie Binder, MD;  [facility];  Service: Endoscopy;;  colon  . ROTATOR CUFF REPAIR     November 2014  . SAVORY DILATION N/A 03/07/2017   Procedure: SAVORY DILATION;  Surgeon: Danie Binder, MD;  [facility];  Service: Endoscopy;  Laterality: N/A;       Family History  Problem Relation Age of Onset  . Cancer Mother        ovarian  . Diabetes type II Father   . Heart attack Father   . Arthritis Sister   . Stroke Brother   . Coronary artery disease Brother   . Diabetes Other   . Heart attack Other   . Colon cancer Neg Hx   . Liver disease Neg Hx   . Ulcers Neg Hx     Social History   Tobacco Use  . Smoking status: Current Every Day Smoker    Packs/day: 0.50    Years: 40.00    Pack years: 20.00    Types: Cigarettes    Start date: 01/09/1969  . Smokeless tobacco: Never Used  Vaping Use  . Vaping Use: Never used  Substance Use Topics  . Alcohol use: No    Alcohol/week: 0.0 standard drinks  . Drug use: No    Home Medications Prior to Admission medications   Medication Sig Start Date End Date Taking? Authorizing Provider  acetaminophen (TYLENOL) 650 MG CR tablet Take 650-1,300 mg by mouth every 8 (eight) hours as needed for pain.    [provider]  albuterol (PROVENTIL HFA;VENTOLIN HFA) 108 (90 BASE) MCG/ACT inhaler Inhale 2 puffs into the lungs See admin instructions. Inhale 1-2 puffs scheduled every morning & every 6 hours as needed for wheezing/shortness of breath.    [provider]  albuterol (PROVENTIL) (2.5 MG/3ML) 0.083% nebulizer solution Take 2.5 mg by nebulization every 6 (six) hours as needed for wheezing or shortness of breath.    [provider]   ALPRAZolam [name]) 0.5 MG tablet Take 1 tablet (0.5 mg total) by mouth 2 (two) times daily as needed for anxiety or sleep. 08/11/20   [name], MD  apixaban (ELIQUIS) 5 MG TABS tablet Take 5 mg by mouth daily.    [provider]  atorvastatin (LIPITOR) 40 MG tablet Take 40 mg by mouth daily.  05/27/19   [provider]  budesonide (ENTOCORT EC) 3 MG 24 hr capsule Take 3 mg by mouth daily.    [provider]  budesonide-formoterol (SYMBICORT) 160-4.5 MCG/ACT inhaler Inhale 2 puffs into the lungs 2 (two) times daily as needed (wheezing/shortness of breath.).     [provider]  cholecalciferol (VITAMIN D3) 25 MCG (1000 UNIT) tablet Take 1,000 Units by mouth in the morning, at noon, in the evening, and at bedtime.  [provider]  CLENPIQ 10-3.5-12 MG-GM -GM/160ML SOLN Take 320 mg by mouth once. 09/03/19   [provider]  diltiazem (CARDIZEM CD) 240 MG 24 hr capsule Take 1 capsule (240 mg total) by mouth daily. 05/23/20   Verta Ellen., NP  doxycycline (VIBRAMYCIN) 100 MG capsule Take 1 capsule (100 mg total) by mouth 2 (two) times daily for 7 days. 09/07/20 09/14/20  Garald Balding, PA-C  ELIQUIS 5 MG TABS tablet Take 1 tablet by mouth in the morning and at bedtime. 04/10/20   [provider]  Eluxadoline (VIBERZI) 75 MG TABS Take 75 mg by mouth daily.    [provider]  EPINEPHrine 0.3 mg/0.3 mL IJ SOAJ injection Inject 0.3 mg into the muscle as needed (allergic reaction).     [provider]  gabapentin (NEURONTIN) 300 MG capsule Take 300 mg by mouth 3 (three) times daily.  05/18/19   [provider]  nortriptyline (PAMELOR) 25 MG capsule Take 1 capsule along with two 50 mg capsules at night (Total of 125 mg) 08/21/20   [name], MD  nortriptyline (PAMELOR) 50 MG capsule Take 2 capsules along with 25 mg capsule at night (Total 125 mg) 08/24/20   [name], MD  omeprazole (PRILOSEC) 20 MG  capsule Take 20 mg by mouth as needed (for heartburn).    [provider]  predniSONE (DELTASONE) 20 MG tablet Take 2 tablets (40 mg total) by mouth daily with breakfast for 5 days, THEN 1 tablet (20 mg total) daily with breakfast for 5 days, THEN 0.5 tablets (10 mg total) daily with breakfast for 5 days. 09/07/20 09/22/20  Garald Balding, PA-C  umeclidinium bromide (INCRUSE ELLIPTA) 62.5 MCG/INH AEPB Inhale 1 puff into the lungs as needed (for wheezing).     [provider]    Allergies    Bee venom, Ciprofloxacin, Levaquin [levofloxacin in d5w], Tramadol, Hydrocodone, and Sulfa antibiotics  Review of Systems   Review of Systems  Constitutional: Negative for chills and fever.  HENT: Negative for ear pain and sore throat.   Eyes: Negative for pain and visual disturbance.  Respiratory: Negative for cough and shortness of breath.   Cardiovascular: Negative for chest pain and palpitations.  Gastrointestinal: Negative for abdominal pain, diarrhea and vomiting.  Genitourinary: Negative for dysuria and hematuria.  Musculoskeletal: Negative for arthralgias and back pain.  Skin: Positive for color change and wound.  Neurological: Negative for seizures, syncope, weakness and numbness.  All other systems reviewed and are negative.   Physical Exam Updated Vital Signs BP (!) 156/79   Pulse 66   Temp 98.1 F (36.7 C) (Oral)   Resp 19   Ht 5\' 10"  (1.778 m)   Wt 81.6 kg   SpO2 100%   BMI 25.83 kg/m   Physical Exam Vitals and nursing note reviewed.  Constitutional:      Appearance: He is well-developed. He is ill-appearing (chronically ill appearing ).  HENT:     Head: Normocephalic and atraumatic.     Nose: Nose normal.     Mouth/Throat:     Mouth: Mucous membranes are moist.     Pharynx: Oropharynx is clear.  Eyes:     Conjunctiva/sclera: Conjunctivae normal.  Cardiovascular:     Rate and Rhythm: Normal rate and regular rhythm.     Pulses: Normal pulses.      Heart sounds: Normal heart sounds. No murmur heard.   Pulmonary:     Effort: Pulmonary effort is  normal. No respiratory distress.     Breath sounds: Normal breath sounds.  Abdominal:     General: Abdomen is flat.     Palpations: Abdomen is soft.     Tenderness: There is no abdominal tenderness.  Musculoskeletal:        General: Swelling present. No tenderness, deformity or signs of injury. Normal range of motion.     Cervical back: Neck supple.     Right lower leg: Edema present.     Left lower leg: Edema present.     Comments: Pt with visible mass to his bicep. Slightly decreased ROM w/ extension. 5/5 grip strength, gross sensations intact. Slightly more edematous right extremity than left extremity. No pitting edema   Skin:    General: Skin is warm and dry.     Findings: Erythema and lesion present. No bruising.     Comments: Erythematous lower extremities bilaterally with visible scratches and several areas of weeping in a linear patter. He has some evidence of healed over scabs in his arm as well. Mildly warm to touch, no focal tenderness or fluctuance   Neurological:     General: No focal deficit present.     Mental Status: He is alert and oriented to person, place, and time.     Sensory: No sensory deficit.     Motor: No weakness.  Psychiatric:        Mood and Affect: Mood normal.        Behavior: Behavior normal.     ED Results / Procedures / Treatments   Labs (all labs ordered are listed, but only abnormal results are displayed) Labs Reviewed  CBC WITH DIFFERENTIAL/PLATELET - Abnormal; Notable for the following components:      Result Value   RBC 4.13 (*)    Hemoglobin 12.9 (*)    RDW 16.1 (*)    All other components within normal limits  BASIC METABOLIC PANEL - Abnormal; Notable for the following components:   Glucose, Bld 119 (*)    Calcium 8.7 (*)    All other components within normal limits  BRAIN NATRIURETIC PEPTIDE    EKG EKG  Interpretation  Date/Time:  Thursday September 07 2020 15:41:16 EDT Ventricular Rate:  66 PR Interval:    QRS Duration: 146 QT Interval:  438 QTC Calculation: 459 R Axis:   81 Text Interpretation: Sinus rhythm Atrial premature complex Right bundle branch block No significant change since last tracing Confirmed by Dorie Rank [phone]) on 09/07/2020 3:45:02 PM   Radiology US Venous Img Lower Unilateral Right  Result Date: 09/07/2020 CLINICAL DATA:  Right leg swelling. EXAM: RIGHT LOWER EXTREMITY VENOUS DOPPLER ULTRASOUND TECHNIQUE: Gray-scale sonography with compression, as well as color and duplex ultrasound, were performed to evaluate the deep venous system(s) from the level of the common femoral vein through the popliteal and proximal calf veins. COMPARISON:  None. FINDINGS: VENOUS Normal compressibility of the common femoral, superficial femoral, and popliteal veins, as well as the visualized calf veins. Visualized portions of profunda femoral vein and great saphenous vein unremarkable. No filling defects to suggest DVT on grayscale or color Doppler imaging. Doppler waveforms show normal direction of venous flow, normal respiratory plasticity and response to augmentation. Limited views of the contralateral common femoral vein are unremarkable. OTHER None. Limitations: none IMPRESSION: Negative. Electronically Signed   By: Kerby Moors M.D.   On: 09/07/2020 15:43   DG Chest Portable 1 View  Result Date: 09/07/2020 CLINICAL DATA:  Rib pain EXAM: PORTABLE CHEST  1 VIEW COMPARISON:  04/21/2017 CT chest. 04/23/2014 chest radiograph and prior. FINDINGS: Emphysematous changes. Patchy left basilar opacities, likely atelectasis. No pneumothorax or pleural effusion. Cardiomediastinal silhouette within normal limits. Right glenohumeral osteoarthrosis. No acute osseous abnormality. IMPRESSION: 1. Patchy left basilar opacities, likely atelectasis. 2. Emphysematous changes. Electronically Signed   By:  Primitivo Gauze M.D.   On: 09/07/2020 15:01    Procedures Procedures (including critical care time)  Medications Ordered in ED Medications - No data to display  ED Course  I have reviewed the triage vital signs and the nursing notes.  Pertinent labs & imaging results that were available during my care of the patient were reviewed by me and considered in my medical decision making (see chart for details).    MDM Rules/Calculators/A&P                          Patient with complaints of small weeping patches and erythema for several days on his upper and lower extremities.  Vitals on presentation are reassuring, physical exam with visible erythema, bilateral edema, right extremity slightly larger than the left.  He has a linear pattern to the scratches and weeping areas.  He also has a visible bulge in his left bicep likely consistent with ruptured biceps tendon.  Rash appears to be consistent with a contact dermatitis as the patient was out in the woods after this developed, however given that his edema in his lower extremities is asymmetrical in the setting of recent hospitalization, a Doppler ultrasound was ordered.  This was negative for DVT.  His BMP and CBC are without significant abnormalities.  BNP is normal.  Chest x-ray with bilateral atelectasis but no significant findings.  EKG normal sinus rhythm.  Overall work-up reassuring.  Patient was informed to follow-up with orthopedics, will send home with doxycycline and steroid course for poison ivy. Sling provided for left arm. Patient was encouraged to follow-up with PCP.  Return precautions discussed.  He was understanding and is agreeable.  At this stage in the ED course, the patient is medically screened and stable for discharge  Patient was seen and evaluated by Dr. Tomi Bamberger who is agreeable to the above plan and disposition  Final Clinical Impression(s) / ED Diagnoses Final diagnoses:  Contact dermatitis, unspecified contact  dermatitis type, unspecified trigger    Rx / DC Orders ED Discharge Orders         Ordered    doxycycline (VIBRAMYCIN) 100 MG capsule  2 times daily        09/07/20 1603    predniSONE (DELTASONE) 20 MG tablet        09/07/20 1603           Lyndel Safe 09/07/20 1630    Dorie Rank, MD 09/07/20 1816

## 2020-10-16 NOTE — Progress Notes (Deleted)
BH MD/PA/NP OP Progress Note  10/16/2020 2:08 PM [name]  MRN:  [id number]  Chief Complaint:  HPI: *** Visit Diagnosis: No diagnosis found.  Past Psychiatric History: Please see initial evaluation for full details. I have reviewed the history. No updates at this time.     Past Medical History:  Past Medical History:  Diagnosis Date  . Chronic lower back pain   . Collagenous colitis [address] [place]  . COPD (chronic obstructive pulmonary disease) ([place])   . Essential hypertension   . Hyperlipidemia   . MRSA infection   . PUD (peptic ulcer disease)     Past Surgical History:  Procedure Laterality Date  . BACK SURGERY    . BIOPSY  06/03/2017   Procedure: BIOPSY;  Surgeon: Danie Binder, MD;  [facility];  Service: Endoscopy;;  colon  . COLONOSCOPY  2001   Dr. Sharlett Iles, normal  . COLONOSCOPY  04/2013   [facility]: Fentanyl 200mg Danford Bad 10mg : sigmoid diverticulosis, normal terminal ileum, small internal hemorrhoids, random colon bx: collagenous colitis. next tcs 04/2018  . COLONOSCOPY  2011   [facility]: two polyps, sessile serrated adenoma, mild diverticulosis  . COLONOSCOPY WITH PROPOFOL N/A 06/03/2017   Dr. Oneida Alar: Diverticulosis, three 2 to 3 mm polyps removed from the mid transverse colon and cecum, three 4 to 6 mm polyps in the rectum and mid descending colon removed.  External/internal hemorrhoids.  hyperplastic polyps.  Random colon biopsies negative for microscopic colitis. next colonoscopy in 5 years.  . ELBOW BURSA SURGERY    . ESOPHAGEAL DILATION N/A 05/19/2015   Procedure: ESOPHAGEAL DILATION;  Surgeon: Danie Binder, MD;  [facility];  Service: Endoscopy;  Laterality: N/A;  . ESOPHAGOGASTRODUODENOSCOPY N/A 10/28/2014   SLF: 1. Esophagitis due to GERD. KOH neg. 2. Small hiatal hernia 3. Moderate erosive gastritis 4. RUQ pain due to large ulcer 5. [place] duodentis in the bulb.   . ESOPHAGOGASTRODUODENOSCOPY N/A 05/19/2015   SLF: 1. Mild  distal esophagitis 2. Peptic stricture at the gastroesophageal junction 3. Mild non-erosive gastritis 4. Pseudo pylorus due to prior PUD.   Marland Kitchen ESOPHAGOGASTRODUODENOSCOPY N/A 03/07/2017   Dr. Oneida Alar: Benign-appearing esophageal stenosis status post dilation, mild gastritis  . FINGER AMPUTATION Left 1985   index finger  . HYDROCELE EXCISION Right 10/02/2018   Procedure: HYDROCELECTOMY ADULT;  Surgeon: Irine Seal, MD;  Location: [facility];  Service: Urology;  Laterality: Right;  . JOINT REPLACEMENT     left hip surgery - January 26, 2014  . POLYPECTOMY  06/03/2017   Procedure: POLYPECTOMY;  Surgeon: Danie Binder, MD;  [facility];  Service: Endoscopy;;  colon  . ROTATOR CUFF REPAIR     November 2014  . SAVORY DILATION N/A 03/07/2017   Procedure: SAVORY DILATION;  Surgeon: Danie Binder, MD;  [facility];  Service: Endoscopy;  Laterality: N/A;    Family Psychiatric History: Please see initial evaluation for full details. I have reviewed the history. No updates at this time.     Family History:  Family History  Problem Relation Age of Onset  . Cancer Mother        ovarian  . Diabetes type II Father   . Heart attack Father   . Arthritis Sister   . Stroke Brother   . Coronary artery disease Brother   . Diabetes Other   . Heart attack Other   . Colon cancer Neg Hx   . Liver  disease Neg Hx   . Ulcers Neg Hx     Social History:  Social History   Socioeconomic History  . Marital status: Widowed    Spouse name: Not on file  . Number of children: Not on file  . Years of education: Not on file  . Highest education level: Not on file  Occupational History  . Not on file  Tobacco Use  . Smoking status: Current Every Day Smoker    Packs/day: 0.50    Years: 40.00    Pack years: 20.00    Types: Cigarettes    Start date: 01/09/1969  . Smokeless tobacco: Never Used  Vaping Use  . Vaping Use: Never used  Substance and Sexual Activity  . Alcohol use: No     Alcohol/week: 0.0 standard drinks  . Drug use: No  . Sexual activity: Not on file  Other Topics Concern  . Not on file  Social History Narrative  . Not on file   Social Determinants of Health   Financial Resource Strain:   . Difficulty of Paying Living Expenses: Not on file  Food Insecurity:   . Worried About Charity fundraiser in the Last Year: Not on file  . Ran Out of Food in the Last Year: Not on file  Transportation Needs:   . Lack of Transportation (Medical): Not on file  . Lack of Transportation (Non-Medical): Not on file  Physical Activity:   . Days of Exercise per Week: Not on file  . Minutes of Exercise per Session: Not on file  Stress:   . Feeling of Stress : Not on file  Social Connections:   . Frequency of Communication with Friends and Family: Not on file  . Frequency of Social Gatherings with Friends and Family: Not on file  . Attends Religious Services: Not on file  . Active Member of Clubs or Organizations: Not on file  . Attends Archivist Meetings: Not on file  . Marital Status: Not on file    Allergies:  Allergies  Allergen Reactions  . Bee Venom Swelling  . Ciprofloxacin Other (See Comments)    Turns red from the chest up.  [place] [Levofloxacin In D5w] Other (See Comments)    CANDIDA ALL OVER MOUTH TO PENIS  . Tramadol Hives       . Hydrocodone Rash  . Sulfa Antibiotics Rash    Metabolic Disorder Labs: No results found for: HGBA1C, MPG No results found for: PROLACTIN No results found for: CHOL, TRIG, HDL, CHOLHDL, VLDL, LDLCALC Lab Results  Component Value Date   TSH 1.27 09/15/2018   TSH 0.829 08/18/2015    Therapeutic Level Labs: No results found for: LITHIUM No results found for: VALPROATE No components found for:  CBMZ  Current Medications: Current Outpatient Medications  Medication Sig Dispense Refill  . acetaminophen (TYLENOL) 650 MG CR tablet Take 650-1,300 mg by mouth every 8 (eight) hours as needed for pain.     Marland Kitchen albuterol (PROVENTIL HFA;VENTOLIN HFA) 108 (90 BASE) MCG/ACT inhaler Inhale 2 puffs into the lungs See admin instructions. Inhale 1-2 puffs scheduled every morning & every 6 hours as needed for wheezing/shortness of breath.    Marland Kitchen albuterol (PROVENTIL) (2.5 MG/3ML) 0.083% nebulizer solution Take 2.5 mg by nebulization every 6 (six) hours as needed for wheezing or shortness of breath.    . ALPRAZolam (XANAX) 0.5 MG tablet Take 1 tablet (0.5 mg total) by mouth 2 (two) times daily as needed for anxiety or  sleep. 60 tablet 1  . apixaban (ELIQUIS) 5 MG TABS tablet Take 5 mg by mouth daily.    Marland Kitchen atorvastatin (LIPITOR) 40 MG tablet Take 40 mg by mouth daily.     . budesonide (ENTOCORT EC) 3 MG 24 hr capsule Take 3 mg by mouth daily.    . budesonide-formoterol (SYMBICORT) 160-4.5 MCG/ACT inhaler Inhale 2 puffs into the lungs 2 (two) times daily as needed (wheezing/shortness of breath.).     Marland Kitchen cholecalciferol (VITAMIN D3) 25 MCG (1000 UNIT) tablet Take 1,000 Units by mouth in the morning, at noon, in the evening, and at bedtime.    Marland Kitchen CLENPIQ 10-3.5-12 MG-GM -GM/160ML SOLN Take 320 mg by mouth once.    . diltiazem (CARDIZEM CD) 240 MG 24 hr capsule Take 1 capsule (240 mg total) by mouth daily. 90 capsule 1  . ELIQUIS 5 MG TABS tablet Take 1 tablet by mouth in the morning and at bedtime.    . Eluxadoline (VIBERZI) 75 MG TABS Take 75 mg by mouth daily.    Marland Kitchen EPINEPHrine 0.3 mg/0.3 mL IJ SOAJ injection Inject 0.3 mg into the muscle as needed (allergic reaction).     . gabapentin (NEURONTIN) 300 MG capsule Take 300 mg by mouth 3 (three) times daily.     . nortriptyline (PAMELOR) 25 MG capsule Take 1 capsule along with two 50 mg capsules at night (Total of 125 mg) 90 capsule 0  . nortriptyline (PAMELOR) 50 MG capsule Take 2 capsules along with 25 mg capsule at night (Total 125 mg) 180 capsule 0  . omeprazole (PRILOSEC) 20 MG capsule Take 20 mg by mouth as needed (for heartburn).    Marland Kitchen umeclidinium bromide  (INCRUSE ELLIPTA) 62.5 MCG/INH AEPB Inhale 1 puff into the lungs as needed (for wheezing).      No current facility-administered medications for this visit.     Musculoskeletal: Strength & Muscle Tone: N/A Gait & Station: N/A Patient leans: N/A  Psychiatric Specialty Exam: Review of Systems  There were no vitals taken for this visit.There is no height or weight on file to calculate BMI.  General Appearance: {Appearance:22683}  Eye Contact:  {BHH EYE CONTACT:22684}  Speech:  Clear and Coherent  Volume:  Normal  Mood:  {BHH MOOD:22306}  Affect:  {Affect (PAA):22687}  Thought Process:  Coherent  Orientation:  Full (Time, Place, and Person)  Thought Content: Logical   Suicidal Thoughts:  {ST/HT (PAA):22692}  Homicidal Thoughts:  {ST/HT (PAA):22692}  Memory:  Immediate;   Good  Judgement:  {Judgement (PAA):22694}  Insight:  {Insight (PAA):22695}  Psychomotor Activity:  Normal  Concentration:  Concentration: Good and Attention Span: Good  Recall:  Good  Fund of Knowledge: Good  Language: Good  Akathisia:  No  Handed:  Right  AIMS (if indicated): not done  Assets:  Communication Skills Desire for Improvement  ADL's:  Intact  Cognition: WNL  Sleep:  {BHH GOOD/FAIR/POOR:22877}   Screenings:   Assessment and Plan:  Garrett DOMINGO Sr. is a 64 y.o. year old male with a history of depression, hypertension,Atrial flutter, OSA not adherent to CPAP,history of hyperlipidemia,collagenous colitis, who presents for follow up appointment for below.     1. Current mild episode of major depressive disorder without prior episode ([place]) There has been overall improvement in depressive symptoms and anxiety in the context of complicated grief of loss of his wife from pancreatic cancer in December 2018.  Other psychosocial stressors includes medical condition of a flutter.  Will continue nortriptyline to  target depression, appetite loss and insomnia.  Discussed potential risk of drowsiness,  cholinergic side effects and palpitation.  Will continue Xanax as needed for anxiety.  He is aware of its potential risk of drowsiness and dependence. (Patient did have accidental overdose in 09/2017 with xanax and opioid).  Plan  1.Continuenortriptyline 125mg  at night  2.Continue Xanax 0.5 mg twice a day as needed for anxiety  3. Next appointment:12/9 at 1:20 for 20 mins, phone - on oxycodone I have utilized the [place] Controlled Substances Reporting System (PMP AWARxE) to confirm adherence regarding the patient's medication. My review reveals appropriate prescription fills.   Past trials of medication:sertraline,lexapro,mirtazapine (drowsiness),venlafaxine, xanax,  The patient demonstrates the following risk factors for suicide: Chronic risk factors for suicide include:psychiatric disorder ofdepression. Acute risk factorsfor suicide include: loss (financial, interpersonal, professional). Protective factorsfor this patient include: positive social support, coping skills and hope for the future. Considering these factors, the overall suicide risk at   [name], MD 10/16/2020, 2:08 PM

## 2020-10-26 ENCOUNTER — Telehealth: Payer: Medicare Other | Admitting: Psychiatry

## 2020-10-26 ENCOUNTER — Other Ambulatory Visit: Payer: Self-pay

## 2020-10-26 ENCOUNTER — Telehealth (HOSPITAL_COMMUNITY): Payer: Medicare HMO | Admitting: Psychiatry

## 2020-10-26 ENCOUNTER — Telehealth: Payer: Self-pay | Admitting: Psychiatry

## 2020-10-26 NOTE — Telephone Encounter (Signed)
Contacted the patient for appointment today. He answered the phone, and states that he received some letter, and he cannot see me anymore. The phone was abruptly disconnected. Contacted back twice, and it connected to voice message. Left a message to contact the office.

## 2020-11-07 DIAGNOSIS — H26491 Other secondary cataract, right eye: Secondary | ICD-10-CM | POA: Diagnosis not present

## 2020-11-07 DIAGNOSIS — H2512 Age-related nuclear cataract, left eye: Secondary | ICD-10-CM | POA: Diagnosis not present

## 2020-11-14 DIAGNOSIS — I10 Essential (primary) hypertension: Secondary | ICD-10-CM | POA: Diagnosis not present

## 2020-11-14 DIAGNOSIS — R5383 Other fatigue: Secondary | ICD-10-CM | POA: Diagnosis not present

## 2020-11-14 DIAGNOSIS — Z0000 Encounter for general adult medical examination without abnormal findings: Secondary | ICD-10-CM | POA: Diagnosis not present

## 2020-11-14 DIAGNOSIS — Z79899 Other long term (current) drug therapy: Secondary | ICD-10-CM | POA: Diagnosis not present

## 2020-11-14 DIAGNOSIS — Z7189 Other specified counseling: Secondary | ICD-10-CM | POA: Diagnosis not present

## 2020-11-14 DIAGNOSIS — E7800 Pure hypercholesterolemia, unspecified: Secondary | ICD-10-CM | POA: Diagnosis not present

## 2020-11-14 DIAGNOSIS — Z299 Encounter for prophylactic measures, unspecified: Secondary | ICD-10-CM | POA: Diagnosis not present

## 2020-12-06 ENCOUNTER — Other Ambulatory Visit: Payer: Self-pay | Admitting: Family Medicine

## 2020-12-26 ENCOUNTER — Ambulatory Visit: Payer: Medicare HMO | Admitting: Cardiology

## 2021-01-11 DIAGNOSIS — H2512 Age-related nuclear cataract, left eye: Secondary | ICD-10-CM | POA: Diagnosis not present

## 2021-02-20 ENCOUNTER — Encounter: Payer: Self-pay | Admitting: Cardiology

## 2021-02-20 NOTE — Progress Notes (Signed)
Cardiology Office Note  Date: 02/21/2021   ID: Garrett Salinas Sr., DOB 1956/01/11, MRN [id number]  PCP:  [name], MD  Cardiologist:  Rozann Lesches, MD Electrophysiologist:  None   Chief Complaint  Patient presents with  . Cardiac follow-up    History of Present Illness: Garrett Draheim. is a 65 y.o. male last seen in August 2021 by Mr. Leonides Sake NP.  He presents for a follow-up visit.  I reviewed the available records.  He was reportedly diagnosed with atrial flutter following evaluation by Dr. Manuella Ghazi and subsequently at [facility] in March of last year.  I do not have actual tracings that confirm this per review of the chart, we are requesting ECG from Dr. Trena Platt office.  He had been on Eliquis and then this was discontinued ultimately after evaluation by Mr. Leonides Sake NP.  Follow-up cardiac monitor was obtained.  Cardiac monitor from August 2021 showed sinus rhythm with rare PACs and PVCs, no sustained arrhythmia.  He does not report any sense of palpitations, states that he has been doing well overall, no active chest pain.  Past Medical History:  Diagnosis Date  . Chronic lower back pain   . Collagenous colitis [address] [place]  . COPD (chronic obstructive pulmonary disease) ([place])   . Coronary artery calcification seen on CT scan   . Essential hypertension   . History of atrial flutter   . Hyperlipidemia   . MRSA infection   . PUD (peptic ulcer disease)     Past Surgical History:  Procedure Laterality Date  . BACK SURGERY    . BIOPSY  06/03/2017   Procedure: BIOPSY;  Surgeon: Danie Binder, MD;  [facility];  Service: Endoscopy;;  colon  . COLONOSCOPY  2001   Dr. Sharlett Iles, normal  . COLONOSCOPY  04/2013   [facility]: Fentanyl 200mg Danford Bad 10mg : sigmoid diverticulosis, normal terminal ileum, small internal hemorrhoids, random colon bx: collagenous colitis. next tcs 04/2018  . COLONOSCOPY  2011   [facility]: two polyps, sessile serrated adenoma, mild  diverticulosis  . COLONOSCOPY WITH PROPOFOL N/A 06/03/2017   Dr. Oneida Alar: Diverticulosis, three 2 to 3 mm polyps removed from the mid transverse colon and cecum, three 4 to 6 mm polyps in the rectum and mid descending colon removed.  External/internal hemorrhoids.  hyperplastic polyps.  Random colon biopsies negative for microscopic colitis. next colonoscopy in 5 years.  . ELBOW BURSA SURGERY    . ESOPHAGEAL DILATION N/A 05/19/2015   Procedure: ESOPHAGEAL DILATION;  Surgeon: Danie Binder, MD;  [facility];  Service: Endoscopy;  Laterality: N/A;  . ESOPHAGOGASTRODUODENOSCOPY N/A 10/28/2014   SLF: 1. Esophagitis due to GERD. KOH neg. 2. Small hiatal hernia 3. Moderate erosive gastritis 4. RUQ pain due to large ulcer 5. [place] duodentis in the bulb.   . ESOPHAGOGASTRODUODENOSCOPY N/A 05/19/2015   SLF: 1. Mild distal esophagitis 2. Peptic stricture at the gastroesophageal junction 3. Mild non-erosive gastritis 4. Pseudo pylorus due to prior PUD.   Marland Kitchen ESOPHAGOGASTRODUODENOSCOPY N/A 03/07/2017   Dr. Oneida Alar: Benign-appearing esophageal stenosis status post dilation, mild gastritis  . FINGER AMPUTATION Left 1985   index finger  . HYDROCELE EXCISION Right 10/02/2018   Procedure: HYDROCELECTOMY ADULT;  Surgeon: Irine Seal, MD;  Location: [facility];  Service: Urology;  Laterality: Right;  . JOINT REPLACEMENT     left hip surgery - January 26, 2014  . POLYPECTOMY  06/03/2017   Procedure: POLYPECTOMY;  Surgeon: Oneida Alar,  Marga Melnick, MD;  [facility];  Service: Endoscopy;;  colon  . ROTATOR CUFF REPAIR     November 2014  . SAVORY DILATION N/A 03/07/2017   Procedure: SAVORY DILATION;  Surgeon: Danie Binder, MD;  [facility];  Service: Endoscopy;  Laterality: N/A;    Current Outpatient Medications  Medication Sig Dispense Refill  . acetaminophen (TYLENOL) 650 MG CR tablet Take 650-1,300 mg by mouth every 8 (eight) hours as needed for pain.    Marland Kitchen albuterol (PROVENTIL HFA;VENTOLIN HFA)  108 (90 BASE) MCG/ACT inhaler Inhale 2 puffs into the lungs See admin instructions. Inhale 1-2 puffs scheduled every morning & every 6 hours as needed for wheezing/shortness of breath.    Marland Kitchen albuterol (PROVENTIL) (2.5 MG/3ML) 0.083% nebulizer solution Take 2.5 mg by nebulization every 6 (six) hours as needed for wheezing or shortness of breath.    Marland Kitchen atorvastatin (LIPITOR) 40 MG tablet Take 40 mg by mouth daily.     Marland Kitchen BREO ELLIPTA 100-25 MCG/INH AEPB Inhale 1 puff into the lungs daily.    . budesonide-formoterol (SYMBICORT) 160-4.5 MCG/ACT inhaler Inhale 2 puffs into the lungs 2 (two) times daily as needed (wheezing/shortness of breath.).     Marland Kitchen cholecalciferol (VITAMIN D3) 25 MCG (1000 UNIT) tablet Take 4,000 Units by mouth daily.    . Coenzyme Q10 (CO Q 10) 100 MG CAPS Take 1 capsule by mouth daily at 6 (six) AM.    . diltiazem (CARDIZEM CD) 240 MG 24 hr capsule TAKE 1 CAPSULE BY MOUTH  DAILY 90 capsule 2  . EPINEPHrine 0.3 mg/0.3 mL IJ SOAJ injection Inject 0.3 mg into the muscle as needed (allergic reaction).     . gabapentin (NEURONTIN) 400 MG capsule Take 400 mg by mouth 4 (four) times daily.    . Glucosamine-Chondroitin 750-600 MG TABS Take 1 tablet by mouth in the morning and at bedtime.    Marland Kitchen omeprazole (PRILOSEC) 20 MG capsule Take 20 mg by mouth as needed (for heartburn).    Marland Kitchen ELIQUIS 5 MG TABS tablet Take 1 tablet by mouth in the morning and at bedtime. (Patient not taking: Reported on 02/21/2021)     No current facility-administered medications for this visit.   Allergies:  Bee venom, Ciprofloxacin, Levaquin [levofloxacin in d5w], Tramadol, Hydrocodone, and Sulfa antibiotics   ROS: No dizziness or syncope.  Physical Exam: VS:  BP 110/70   Pulse 70   Ht 5\' 10"  (1.778 m)   Wt 191 lb (86.6 kg)   SpO2 97%   BMI 27.41 kg/m , BMI Body mass index is 27.41 kg/m.  Wt Readings from Last 3 Encounters:  02/21/21 191 lb (86.6 kg)  09/07/20 180 lb (81.6 kg)  06/19/20 196 lb 12.8 oz (89.3  kg)    General: Patient appears comfortable at rest. HEENT: Conjunctiva and lids normal, wearing a mask. Neck: Supple, no elevated JVP or carotid bruits, no thyromegaly. Lungs: Clear to auscultation, nonlabored breathing at rest. Cardiac: Regular rate and rhythm, no S3 or significant systolic murmur. Extremities: No pitting edema.  ECG:  An ECG dated 09/07/2020 was personally reviewed today and demonstrated:  Sinus rhythm with right bundle branch block and PAC.  Recent Labwork: 09/07/2020: B Natriuretic Peptide 89.0; BUN 8; Creatinine, Ser 0.69; Hemoglobin 12.9; Platelets 281; Potassium 4.0; Sodium 139   Other Studies Reviewed Today:  Lexiscan Myoview 08/08/2015([facility]): No evidence of scar or ischemia with LVEF 59%.  Echocardiogram 01/27/2020 [facility]): Summary  1. The left ventricle is  normal in size with normal wall thickness.  2. The left ventricular systolic function is normal, LVEF is visually  estimated at 55-60%.  3. There is mild mitral valve regurgitation.  4. There is mild aortic regurgitation.  5. The left atrium is mildly dilated in size.  6. The right ventricle is upper normal in size, with normal systolic  function.  7. There is mild pulmonary hypertension, estimated pulmonary artery systolic  pressure is 41 mmHg.  8. The right atrium is mildly dilatedin size.   Cardiac monitor August 2021: [facility] AT reviewed, 14 days analyzed.  Predominant rhythm is sinus with heart rate ranging from 58 bpm up to 156 bpm and average heart rate 87 bpm. Rare PACs and PVCs were noted representing less than 1% total beats. There were no sustained arrhythmias or pauses.  Assessment and Plan:  1.  History of atrial flutter, details not entirely clear.  Records from [facility] or not conclusive.  We are requesting initial ECG from Dr. Trena Platt office for review.  If he did have atrial flutter documented at that time, even though his subsequent monitor was  unrevealing, would recommend resumption of Eliquis for stroke prophylaxis in light of CHA2DS2-VASc score of 5 (age, hypertension, coronary calcification by CT, evidence of prior stroke by head CT).  Further recommendations to follow.  He remains on Cardizem CD.  2.  Asymptomatic coronary artery calcification by CT imaging.  At this point he is on aspirin and Lipitor.  3.  Mixed hyperlipidemia on Lipitor.  Requesting lab work from Dr. Manuella Ghazi.  Medication Adjustments/Labs and Tests Ordered: Current medicines are reviewed at length with the patient today.  Concerns regarding medicines are outlined above.   Tests Ordered: No orders of the defined types were placed in this encounter.   Medication Changes: No orders of the defined types were placed in this encounter.   Disposition:  Follow up 6 months in the [place] office.  Signed, Satira Sark, MD, [facility] 02/21/2021 9:53 AM    [place] [facility] [place], [place], [place] [address] Phone: [phone]; Fax: [phone]

## 2021-02-21 ENCOUNTER — Ambulatory Visit (INDEPENDENT_AMBULATORY_CARE_PROVIDER_SITE_OTHER): Payer: 59 | Admitting: Cardiology

## 2021-02-21 ENCOUNTER — Encounter: Payer: Self-pay | Admitting: *Deleted

## 2021-02-21 ENCOUNTER — Other Ambulatory Visit: Payer: Self-pay

## 2021-02-21 ENCOUNTER — Encounter: Payer: Self-pay | Admitting: Cardiology

## 2021-02-21 VITALS — BP 110/70 | HR 70 | Ht 70.0 in | Wt 191.0 lb

## 2021-02-21 DIAGNOSIS — E782 Mixed hyperlipidemia: Secondary | ICD-10-CM

## 2021-02-21 DIAGNOSIS — Z8679 Personal history of other diseases of the circulatory system: Secondary | ICD-10-CM

## 2021-02-21 DIAGNOSIS — I2510 Atherosclerotic heart disease of native coronary artery without angina pectoris: Secondary | ICD-10-CM

## 2021-02-21 NOTE — Patient Instructions (Addendum)

## 2021-02-28 ENCOUNTER — Telehealth: Payer: Self-pay | Admitting: Cardiology

## 2021-02-28 DIAGNOSIS — I4892 Unspecified atrial flutter: Secondary | ICD-10-CM

## 2021-02-28 DIAGNOSIS — Z79899 Other long term (current) drug therapy: Secondary | ICD-10-CM

## 2021-02-28 NOTE — Telephone Encounter (Signed)
Patient called stating that he needs to know if he is suppose to go back on   ELIQUIS 5 MG TABS tablet

## 2021-02-28 NOTE — Telephone Encounter (Signed)
Advised that [facility] will be contacted again to see if they were able to pull old EKG's from chart max

## 2021-03-01 NOTE — Telephone Encounter (Signed)
Thank you. I was able to review all the ECGs. He did have atrial flutter in March 2021. As per my recent note, would resume Eliquis 5 mg BID. Check CBC and BMET for next visit in 6 months.

## 2021-03-01 NOTE — Telephone Encounter (Signed)
Spoke with Wells Guiles in Medical Records to check status of requested EKG's from March 2021 and October 2021-March 2021 EKG's sent were already scanned into chart. Per Wells Guiles, and EKG was ordered on 08/19/2020 but is unavailable and not able to locate.

## 2021-03-06 MED ORDER — ELIQUIS 5 MG PO TABS
5.0000 mg | ORAL_TABLET | Freq: Two times a day (BID) | ORAL | 6 refills | Status: DC
Start: 2021-03-06 — End: 2022-03-12

## 2021-03-06 NOTE — Telephone Encounter (Signed)
Patient informed and verbalized understanding of plan. Lab orders faxed to [facility].

## 2021-04-04 DIAGNOSIS — I2510 Atherosclerotic heart disease of native coronary artery without angina pectoris: Secondary | ICD-10-CM | POA: Diagnosis not present

## 2021-04-04 DIAGNOSIS — K1370 Unspecified lesions of oral mucosa: Secondary | ICD-10-CM | POA: Diagnosis not present

## 2021-04-04 DIAGNOSIS — Z299 Encounter for prophylactic measures, unspecified: Secondary | ICD-10-CM | POA: Diagnosis not present

## 2021-04-04 DIAGNOSIS — F17210 Nicotine dependence, cigarettes, uncomplicated: Secondary | ICD-10-CM | POA: Diagnosis not present

## 2021-04-04 DIAGNOSIS — I10 Essential (primary) hypertension: Secondary | ICD-10-CM | POA: Diagnosis not present

## 2021-05-14 DIAGNOSIS — K219 Gastro-esophageal reflux disease without esophagitis: Secondary | ICD-10-CM | POA: Diagnosis not present

## 2021-05-14 DIAGNOSIS — K120 Recurrent oral aphthae: Secondary | ICD-10-CM | POA: Diagnosis not present

## 2021-05-14 DIAGNOSIS — R070 Pain in throat: Secondary | ICD-10-CM | POA: Diagnosis not present

## 2021-06-13 DIAGNOSIS — Z0000 Encounter for general adult medical examination without abnormal findings: Secondary | ICD-10-CM | POA: Diagnosis not present

## 2021-06-13 DIAGNOSIS — F17210 Nicotine dependence, cigarettes, uncomplicated: Secondary | ICD-10-CM | POA: Diagnosis not present

## 2021-06-13 DIAGNOSIS — Z7189 Other specified counseling: Secondary | ICD-10-CM | POA: Diagnosis not present

## 2021-06-13 DIAGNOSIS — R5383 Other fatigue: Secondary | ICD-10-CM | POA: Diagnosis not present

## 2021-06-13 DIAGNOSIS — Z299 Encounter for prophylactic measures, unspecified: Secondary | ICD-10-CM | POA: Diagnosis not present

## 2021-06-13 DIAGNOSIS — Z79899 Other long term (current) drug therapy: Secondary | ICD-10-CM | POA: Diagnosis not present

## 2021-06-13 DIAGNOSIS — E7800 Pure hypercholesterolemia, unspecified: Secondary | ICD-10-CM | POA: Diagnosis not present

## 2021-06-13 DIAGNOSIS — I10 Essential (primary) hypertension: Secondary | ICD-10-CM | POA: Diagnosis not present

## 2021-06-24 DIAGNOSIS — H5213 Myopia, bilateral: Secondary | ICD-10-CM | POA: Diagnosis not present

## 2021-07-10 DIAGNOSIS — R079 Chest pain, unspecified: Secondary | ICD-10-CM | POA: Diagnosis not present

## 2021-07-10 DIAGNOSIS — Z299 Encounter for prophylactic measures, unspecified: Secondary | ICD-10-CM | POA: Diagnosis not present

## 2021-07-10 DIAGNOSIS — I10 Essential (primary) hypertension: Secondary | ICD-10-CM | POA: Diagnosis not present

## 2021-07-10 DIAGNOSIS — M7552 Bursitis of left shoulder: Secondary | ICD-10-CM | POA: Diagnosis not present

## 2021-07-10 DIAGNOSIS — F17210 Nicotine dependence, cigarettes, uncomplicated: Secondary | ICD-10-CM | POA: Diagnosis not present

## 2021-07-11 DIAGNOSIS — M549 Dorsalgia, unspecified: Secondary | ICD-10-CM | POA: Diagnosis not present

## 2021-07-11 DIAGNOSIS — S2232XA Fracture of one rib, left side, initial encounter for closed fracture: Secondary | ICD-10-CM | POA: Diagnosis not present

## 2021-07-11 DIAGNOSIS — S2242XA Multiple fractures of ribs, left side, initial encounter for closed fracture: Secondary | ICD-10-CM | POA: Diagnosis not present

## 2021-08-17 ENCOUNTER — Other Ambulatory Visit: Payer: Self-pay | Admitting: Cardiology

## 2021-08-21 ENCOUNTER — Telehealth: Payer: Self-pay | Admitting: Cardiology

## 2021-08-21 DIAGNOSIS — I10 Essential (primary) hypertension: Secondary | ICD-10-CM

## 2021-08-21 NOTE — Telephone Encounter (Signed)
Lab orders initially faxed to [facility] in April.    Will send new orders to [facility] as requested ([facility]).

## 2021-08-21 NOTE — Telephone Encounter (Signed)
Pt notes state he's needing labs - please send orders to [facility]

## 2021-08-28 DIAGNOSIS — I10 Essential (primary) hypertension: Secondary | ICD-10-CM | POA: Diagnosis not present

## 2021-08-29 NOTE — Progress Notes (Signed)
Cardiology Office Note  Date: 08/29/2021   ID: Garrett Salinas Sr., DOB 04-17-1956, MRN [id number]  PCP:  [name], MD  Cardiologist:  Rozann Lesches, MD Electrophysiologist:  None   Chief Complaint: 56-monthfollow-up  History of Present Illness: Garrett MARVINSr. is a 65y.o. male with a history of HTN, coronary artery calcification, COPD, PUD, HLD.  Last saw Dr. MDomenic Politeon 02/21/2021  for follow-up.  He had been diagnosed with atrial flutter following evaluation [facility]  EKG was requested from Dr. STrena Plattoffice.  He had been on Eliquis and that was discontinued ultimately.  Follow-up cardiac monitor was obtained.  Cardiac monitor from August 2021 showed sinus rhythm with rare PACs and PVCs, no sustained arrhythmias.  He did not report any sense of palpitation.  He had been doing overall well no active chest pain.  [facility]  EKG from Dr. STrena Plattoffice requested for review.  Dr. MDomenic Politenoted  subsequent monitor was unrevealing.  Prior to recommending resumption of Eliquis for stroke prophylaxis in light of CHA2DS2-VASc score of 5 (age, hypertension, coronary artery calcification by CT, evidence of prior stroke by head CT).  He remained on Cardizem CD.  He was continuing Lipitor and aspirin.  Lab work was requested from Dr. SManuella Ghazi   He is here for 664-monthollow-up today.  He denies any cardiac issues.  No palpitations or arrhythmias or rapid heart rates.  No bleeding on Eliquis.  At last visit Dr. McDomenic Politead restarted him on Eliquis due to evidence of previous documentation of atrial flutter.  He had recent lab work pending visit today since starting Eliquis.  He denies any bleeding on Eliquis.  His EKG today shows normal sinus rhythm with right bundle branch block rate of 72.  He continues to smoke in spite of continued encouragement to stop.  States he had a cigarette before arriving today.   His blood pressure is elevated today 158/86.  States normally blood pressure is within normal limits.  He had a recent chest x-ray after a fall last year with continued complaints of the left back and rib pain on July 11, 2021 ordered by Dr. ShManuella Ghaziemonstrating remote fractures of the fourth through ninth ribs on the left.  There was an additional acute appearing fraction of the left seventh rib laterally.  Lungs were clear.  No pneumothorax or pleural effusion.  He is complaining of left chest discomfort and the rib cage area with a popping sensation to his chest which can be felt when he takes deep breaths.  States he is also having some pain in that area.  Recent BMP on 08/28/2021 was unremarkable.  CBC unremarkable.   Past Medical History:  Diagnosis Date   Chronic lower back pain    Collagenous colitis 2014   [facility]   [place]   Coronary artery calcification seen on CT scan    Essential hypertension    History of atrial flutter    Hyperlipidemia    MRSA infection    PUD (peptic ulcer disease)     Past Surgical History:  Procedure Laterality Date   BACK SURGERY     BIOPSY  06/03/2017   Procedure: BIOPSY;  Surgeon: FiDanie BinderMD;  [facility];  Service: Endoscopy;;  colon   COLONOSCOPY  2001   Dr. PaSharlett Ilesnormal   COLONOSCOPY  04/2013   [facility]: Fentanyl 280m/versed 136m sigmoid diverticulosis, normal terminal ileum, small internal hemorrhoids, random colon bx: collagenous colitis. next tcs 04/2018   [facility], sessile serrated adenoma, mild diverticulosis   COLONOSCOPY WITH PROPOFOL N/A 06/03/2017   Dr. fiOneida AlarDiverticulosis, three 2 to 3 mm polyps removed from the mid transverse colon and cecum, three 4 to 6 mm polyps in the rectum and mid descending colon removed.  External/internal hemorrhoids.  hyperplastic polyps.  Random colon biopsies negative for microscopic colitis. next  colonoscopy in 5 years.   ELBOW BURSA SURGERY     ESOPHAGEAL DILATION N/A 05/19/2015   Procedure: ESOPHAGEAL DILATION;  Surgeon: SaDanie BinderMD;  [facility];  Service: Endoscopy;  Laterality: N/A;   ESOPHAGOGASTRODUODENOSCOPY N/A 10/28/2014   SLF: 1. Esophagitis due to GERD. KOH neg. 2. Small hiatal hernia 3. Moderate erosive gastritis 4. RUQ pain due to large ulcer 5. [place]is in the bulb.    ESOPHAGOGASTRODUODENOSCOPY N/A 05/19/2015   SLF: 1. Mild distal esophagitis 2. Peptic stricture at the gastroesophageal junction 3. Mild non-erosive gastritis 4. Pseudo pylorus due to prior PUD.    ESOPHAGOGASTRODUODENOSCOPY N/A 03/07/2017   Dr. FiOneida AlarBenign-appearing esophageal stenosis status post dilation, mild gastritis   FINGER AMPUTATION Left 1985   index finger   HYDROCELE EXCISION Right 10/02/2018   Procedure: HYDROCELECTOMY ADULT;  Surgeon: WrIrine SealMD;  Location: [facility];  Service: Urology;  Laterality: Right;   JOINT REPLACEMENT     left hip surgery - January 26, 2014   POLYPECTOMY  06/03/2017   Procedure: POLYPECTOMY;  Surgeon: FiDanie BinderMD;  [facility];  Service: Endoscopy;;  colon   ROTATOR CUFF REPAIR     November 2014   SAVORY DILATION N/A 03/07/2017   Procedure: SAVORY DILATION;  Surgeon: SaDanie BinderMD;  [facility];  Service: Endoscopy;  Laterality: N/A;    Current Outpatient Medications  Medication Sig Dispense Refill   acetaminophen (TYLENOL) 650 MG CR tablet Take 650-1,300 mg by mouth every 8 (eight) hours as needed for pain.     albuterol (PROVENTIL HFA;VENTOLIN HFA) 108 (90 BASE) MCG/ACT inhaler Inhale 2 puffs into the lungs See admin instructions. Inhale 1-2 puffs scheduled every morning & every 6 hours as needed for wheezing/shortness of breath.     albuterol (PROVENTIL) (2.5 MG/3ML) 0.083% nebulizer solution Take 2.5 mg by nebulization every 6 (six) hours as needed for wheezing or shortness of breath.     apixaban  (ELIQUIS) 5 MG TABS tablet Take 1 tablet (5 mg total) by mouth 2 (two) times daily. 60 tablet 6   atorvastatin (LIPITOR) 40 MG tablet Take 40 mg by mouth daily.      BREO ELLIPTA 100-25 MCG/INH AEPB Inhale 1 puff into the lungs daily.     budesonide-formoterol (SYMBICORT) 160-4.5 MCG/ACT inhaler Inhale 2 puffs into the lungs 2 (two) times daily as needed (wheezing/shortness of breath.).      cholecalciferol (VITAMIN D3) 25 MCG (1000 UNIT) tablet Take 4,000 Units by mouth daily.     Coenzyme Q10 (CO Q 10) 100 MG CAPS Take 1 capsule by mouth daily at 6 (six) AM.     diltiazem (CARDIZEM CD) 240 MG 24 hr capsule TAKE 1 CAPSULE BY MOUTH  DAILY 90 capsule 3   ELIQUIS 5 MG TABS tablet Take 1 tablet by mouth in the morning and at bedtime. (Patient not taking: Reported on 02/21/2021)  EPINEPHrine 0.3 mg/0.3 mL IJ SOAJ injection Inject 0.3 mg into the muscle as needed (allergic reaction).      gabapentin (NEURONTIN) 400 MG capsule Take 400 mg by mouth 4 (four) times daily.     Glucosamine-Chondroitin 750-600 MG TABS Take 1 tablet by mouth in the morning and at bedtime.     omeprazole (PRILOSEC) 20 MG capsule Take 20 mg by mouth as needed (for heartburn).     No current facility-administered medications for this visit.   Allergies:  Bee venom, Ciprofloxacin, Levaquin [levofloxacin in d5w], Tramadol, Hydrocodone, and Sulfa antibiotics   Social History: The patient  reports that he has been smoking cigarettes. He started smoking about 52 years ago. He has a 20.00 pack-year smoking history. He has never used smokeless tobacco. He reports that he does not drink alcohol and does not use drugs.   Family History: The patient's family history includes Arthritis in his sister; Coronary artery disease in his brother; Diabetes in an other family member; Diabetes type II in his father; Heart attack in his father and another family member; Ovarian cancer in his mother; Stroke in his brother.   ROS:  Please see the  history of present illness. Otherwise, complete review of systems is positive for none.  All other systems are reviewed and negative.   Physical Exam: VS:  There were no vitals taken for this visit., BMI There is no height or weight on file to calculate BMI.  Wt Readings from Last 3 Encounters:  02/21/21 191 lb (86.6 kg)  09/07/20 180 lb (81.6 kg)  06/19/20 196 lb 12.8 oz (89.3 kg)    General: Patient appears comfortable at rest. HEENT: Conjunctiva and lids normal, oropharynx clear with moist mucosa. Neck: Supple, no elevated JVP or carotid bruits, no thyromegaly. Lungs: Clear to auscultation, nonlabored breathing at rest. Cardiac: Regular rate and rhythm, no S3 or significant systolic murmur, no pericardial rub. Abdomen: Soft, nontender, no hepatomegaly, bowel sounds present, no guarding or rebound. Extremities: No pitting edema, distal pulses 2+. Skin: Warm and dry. Musculoskeletal: No kyphosis. Neuropsychiatric: Alert and oriented x3, affect grossly appropriate.  ECG: 08/30/2021 EKG normal sinus rhythm rate of 72, right bundle branch block.  Recent Labwork: 09/07/2020: B Natriuretic Peptide 89.0; BUN 8; Creatinine, Ser 0.69; Hemoglobin 12.9; Platelets 281; Potassium 4.0; Sodium 139  No results found for: CHOL, TRIG, HDL, CHOLHDL, VLDL, LDLCALC, LDLDIRECT  Other Studies Reviewed Today:  Lexiscan Myoview 08/08/2015 [facility]): No evidence of scar or ischemia with LVEF 59%.   Echocardiogram 01/27/2020 [facility]): Summary    1. The left ventricle is normal in size with normal wall thickness.    2. The left ventricular systolic function is normal, LVEF is visually  estimated at 55-60%.    3. There is mild mitral valve regurgitation.    4. There is mild aortic regurgitation.    5. The left atrium is mildly dilated in size.    6. The right ventricle is upper normal in size, with normal systolic  function.    7. There is mild pulmonary hypertension, estimated pulmonary artery  systolic  pressure is 41 mmHg.    8. The right atrium is mildly dilated  in size.    Cardiac monitor August 2021: Zio AT reviewed, 14 days analyzed.  Predominant rhythm is sinus with heart rate ranging from 58 bpm up to 156 bpm and average heart rate 87 bpm. Rare PACs and PVCs were noted representing less than 1% total beats. There were no sustained  arrhythmias or pauses.   Assessment and Plan:  1. Atrial flutter, unspecified type ([place])   2. Coronary artery calcification   3. Mixed hyperlipidemia    1. Atrial flutter, unspecified type ([place]) Previous history of atrial flutter.  He was recently restarted on Eliquis p.o. twice daily.  He states today he was only taking it once a day.  No bleeding on Eliquis.  Encouraged him to start taking the medication twice daily.  EKG today shows normal sinus rhythm with a rate of 72, right bundle branch block.  Continue Eliquis p.o. twice daily 5 mg.  Continue Cardizem CD 240 mg daily.  Recent CBC and c-Met were unremarkable.  2. Coronary artery calcification Denies any chest pain.but does complain of left subcostal pain.  Had a recent x-ray for left-sided chest pain/rib pain showing old healing rib fractures fourth through ninth ribs and a new acute fracture of the seventh rib ordered by his PCP on 07/11/2021.  Previous CT scanning 2018 for lung cancer screening showed coronary artery atherosclerosis and aortic atherosclerosis.  3. Mixed hyperlipidemia Continue atorvastatin 40 mg p.o. daily.  Co-Q10 100 mg daily.  Lipid panel October 2021: Total cholesterol 167, triglycerides 152, HDL 58, LDL 83.  4.  History of significant smoking, COPD/emphysema.   Continues to smoke.  States he has not seen a pulmonary specialist in quite some time.  He is complaining of some pain on inspiration and a popping sensation in his left chest/left subcostal pain.  Had a recent x-ray for left-sided chest pain/rib pain showing old healing rib fractures fourth through ninth and a  new acute fracture of the seventh rib.  He states pain is worse with deep inspiration.  CT for lung cancer screening in June 2018 demonstrating moderate centrilobular emphysema.  No suspicious pulmonary nodule or mass.  Recommended annual screening with low-dose CT with contrast in 12 months.  Evidence of coronary artery atherosclerosis and aortic atherosclerosis.  Please refer to pulmonary Dr. Elsworth Soho or Dr. Melvyn Novas.   Medication Adjustments/Labs and Tests Ordered: Current medicines are reviewed at length with the patient today.  Concerns regarding medicines are outlined above.   Disposition: Follow-up with Dr. Domenic Polite or APP 6 months  Signed, Levell July, NP 08/29/2021 9:25 PM    [facility] at [place], [place], [place] [address] Phone: [phone]; Fax: [phone]

## 2021-08-30 ENCOUNTER — Ambulatory Visit (INDEPENDENT_AMBULATORY_CARE_PROVIDER_SITE_OTHER): Payer: Medicare Other | Admitting: Family Medicine

## 2021-08-30 ENCOUNTER — Other Ambulatory Visit: Payer: Self-pay

## 2021-08-30 ENCOUNTER — Encounter: Payer: Self-pay | Admitting: Family Medicine

## 2021-08-30 VITALS — BP 158/86 | HR 72 | Ht 70.0 in | Wt 197.8 lb

## 2021-08-30 DIAGNOSIS — I4892 Unspecified atrial flutter: Secondary | ICD-10-CM | POA: Diagnosis not present

## 2021-08-30 DIAGNOSIS — J449 Chronic obstructive pulmonary disease, unspecified: Secondary | ICD-10-CM | POA: Diagnosis not present

## 2021-08-30 DIAGNOSIS — E782 Mixed hyperlipidemia: Secondary | ICD-10-CM

## 2021-08-30 DIAGNOSIS — I2510 Atherosclerotic heart disease of native coronary artery without angina pectoris: Secondary | ICD-10-CM

## 2021-08-30 DIAGNOSIS — J410 Simple chronic bronchitis: Secondary | ICD-10-CM

## 2021-08-30 DIAGNOSIS — I2584 Coronary atherosclerosis due to calcified coronary lesion: Secondary | ICD-10-CM | POA: Diagnosis not present

## 2021-08-30 NOTE — Patient Instructions (Addendum)
Medication Instructions:  Your physician recommends that you continue on your current medications as directed. Please refer to the Current Medication list given to you today.  *If you need a refill on your cardiac medications before your next appointment, please call your pharmacy*   Lab Work: None ordered   You have been referred to  Pulmonology   If you have labs (blood work) drawn today and your tests are completely normal, you will receive your results only by: [place] (if you have MyChart) OR A paper copy in the mail If you have any lab test that is abnormal or we need to change your treatment, we will call you to review the results.   Testing/Procedures: None ordered   Follow-Up: At [facility], you and your health needs are our priority.  As part of our continuing mission to provide you with exceptional heart care, we have created designated Provider Care Teams.  These Care Teams include your primary Cardiologist (physician) and Advanced Practice Providers (APPs -  Physician Assistants and Nurse Practitioners) who all work together to provide you with the care you need, when you need it.  We recommend signing up for the patient portal called "MyChart".  Sign up information is provided on this After Visit Summary.  MyChart is used to connect with patients for Virtual Visits (Telemedicine).  Patients are able to view lab/test results, encounter notes, upcoming appointments, etc.  Non-urgent messages can be sent to your provider as well.   To learn more about what you can do with MyChart, go to [email or web address].    Your next appointment:   6 month(s)  The format for your next appointment:   In Person  Provider:   You may see Rozann Lesches, MD or the following Advanced Practice Provider on your designated Care Team:   Katina Dung, NP   Other Instructions

## 2021-09-05 ENCOUNTER — Telehealth: Payer: Self-pay | Admitting: Cardiology

## 2021-09-05 NOTE — Telephone Encounter (Signed)
New Message    Patient called about his blood work that he is to have done before he comes back to see Dr Domenic Polite in April ?

## 2021-09-05 NOTE — Telephone Encounter (Signed)
Advised that at this time, he doesn't need any labs Verbalized understanding

## 2021-10-09 ENCOUNTER — Ambulatory Visit (INDEPENDENT_AMBULATORY_CARE_PROVIDER_SITE_OTHER): Payer: Medicare Other | Admitting: Internal Medicine

## 2021-10-09 ENCOUNTER — Encounter: Payer: Self-pay | Admitting: Internal Medicine

## 2021-10-09 ENCOUNTER — Other Ambulatory Visit: Payer: Self-pay

## 2021-10-09 VITALS — BP 142/98 | HR 84 | Temp 98.8°F | Ht 70.0 in | Wt 194.2 lb

## 2021-10-09 DIAGNOSIS — J449 Chronic obstructive pulmonary disease, unspecified: Secondary | ICD-10-CM

## 2021-10-09 DIAGNOSIS — S2232XG Fracture of one rib, left side, subsequent encounter for fracture with delayed healing: Secondary | ICD-10-CM | POA: Diagnosis not present

## 2021-10-09 DIAGNOSIS — F17210 Nicotine dependence, cigarettes, uncomplicated: Secondary | ICD-10-CM | POA: Diagnosis not present

## 2021-10-09 DIAGNOSIS — R0789 Other chest pain: Secondary | ICD-10-CM

## 2021-10-09 NOTE — Assessment & Plan Note (Addendum)
Onset of symptoms in his 69s rx prn saba only as of 10/09/2021   - The proper method of use, as well as anticipated side effects, of a metered-dose inhaler were discussed and demonstrated to the patient using teach back method.   > 3 min discussion I reviewed the Fletcher curve with the patient that basically indicates  if you quit smoking when your best day FEV1 is still well preserved (as is likely still the case here)  it is highly unlikely you will progress to severe disease and informed the patient there was  no medication on the market that has proven to alter the curve/ its downward trajectory  or the likelihood of progression of their disease(unlike other chronic medical conditions such as atheroclerosis where we do think we can change the natural hx with risk reducing meds)    Therefore stopping smoking and maintaining abstinence are  the most important aspects of his care, not choice of inhalers or for that matter, doctors.   Treatment other than smoking cessation  is entirely directed by severity of symptoms and focused also on reducing exacerbations, not attempting to change the natural history of the disease.  Since no limiting symptoms on a typical day, nor tendency to aecopd, saba prn is fine (GROUP A rx)   >>> return for PFTs

## 2021-10-09 NOTE — Patient Instructions (Addendum)
Plan A = Automatic = Always=    Breathe clear air  - NO SMOKING/ LUNGS AT WORK  Plan B = Backup (to supplement plan A, not to replace it) Only use your albuterol inhaler as a rescue medication to be used if you can't catch your breath by resting or doing a relaxed purse lip breathing pattern.  - The less you use it, the better it will work when you need it. - Ok to use the inhaler up to 2 puffs  every 4 hours if you must but call for appointment if use goes up over your usual need - Don't leave home without it !!  (think of it like starter fluid for your car)   Plan C = Crisis (instead of Plan B but only if Plan B stops working) - only use your albuterol nebulizer if you first try Plan B and it fails to help > ok to use the nebulizer up to every 4 hours but if start needing it regularly call for immediate appointment  Ok to try albuterol 15 min before an activity (on alternating days)  that you know would usually make you short of breath and see if it makes any difference and if makes none then don't take albuterol after activity unless you can't catch your breath as this means it's the resting that helps, not the albuterol.       When you get home check your medications against our list and call with discrepancies and I will call you once I have discussed your case with colleagues   We will schedule you for pfts next available

## 2021-10-09 NOTE — Progress Notes (Signed)
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 Leonides Sake for cracked rib possible lung puncture(?)  Dyspnea:  denies being limited by breathing but really not very active Cough: denies at preent  Sleep: tries to stay off back or L side and does fine SABA use: neb maybe  once a week / / proair once a week "when over does it"  (never specified what that was)  No obvious day to day or daytime variability or assoc excess/ purulent sputum or mucus plugs or hemoptysis or  chest tightness, subjective wheeze or overt sinus or hb symptoms.     Also denies any obvious fluctuation of symptoms with weather or environmental changes or other aggravating or alleviating factors except as outlined above   No unusual exposure hx or h/o childhood pna/ asthma or knowledge of premature birth.  Current Allergies, Complete Past Medical History, Past Surgical History, Family History, and Social History were reviewed in [employer] record.  ROS  The following are not active complaints unless bolded Hoarseness, sore throat, dysphagia, dental problems, itching, sneezing,  nasal congestion or discharge of excess mucus or purulent secretions, ear ache,   fever, chills, sweats, unintended wt loss or wt gain, classically pleuritic or exertional cp,  orthopnea pnd or arm/hand swelling  or leg swelling, presyncope, palpitations, abdominal  pain, anorexia, nausea, vomiting, diarrhea  or change in bowel habits or change in bladder habits, change in stools or change in urine, dysuria, hematuria,  rash, arthralgias, visual complaints, headache, numbness, weakness or ataxia or problems with walking or coordination,  change in mood or  memory.           Past Medical History:  Diagnosis Date   Chronic lower back pain    Collagenous colitis 2014   [facility]   COPD (chronic obstructive pulmonary disease) ([place])    Coronary artery calcification seen on CT scan    Essential hypertension    History of atrial flutter    Hyperlipidemia    MRSA infection    PUD (peptic ulcer disease)     Outpatient Medications Prior to Visit  Medication Sig Dispense Refill   acetaminophen (TYLENOL) 650 MG CR tablet Take 650-1,300 mg by mouth every 8 (eight) hours as needed for pain.     albuterol (PROVENTIL HFA;VENTOLIN HFA) 108 (90 BASE) MCG/ACT inhaler Inhale 2 puffs into the lungs See admin instructions. Inhale 1-2 puffs scheduled every morning & every 6 hours as needed for wheezing/shortness of breath.     albuterol (PROVENTIL) (2.5 MG/3ML) 0.083% nebulizer solution Take 2.5 mg by nebulization every 6 (six) hours as needed for wheezing or shortness of breath.         6   atorvastatin (LIPITOR) 40 MG tablet Take 40 mg by mouth daily.  cholecalciferol (VITAMIN D3) 25 MCG (1000 UNIT) tablet Take 4,000 Units by mouth daily.     Coenzyme Q10 (CO Q 10) 100 MG CAPS Take 1 capsule by mouth daily at 6 (six) AM.     diltiazem (CARDIZEM CD) 240 MG 24 hr capsule TAKE 1 CAPSULE BY MOUTH  DAILY 90 capsule 3          EPINEPHrine 0.3 mg/0.3 mL IJ SOAJ injection Inject 0.3 mg into the muscle as needed (allergic reaction).              gabapentin (NEURONTIN) 400 MG capsule Did not help so stopped it p sev months     Glucosamine-Chondroitin 750-600 MG TABS Take 1 tablet by mouth in the morning and at bedtime.     omeprazole (PRILOSEC) 20 MG capsule Take  20 mg by mouth as needed (for heartburn).     sildenafil (VIAGRA) 100 MG tablet Take 100 mg by mouth daily as needed for erectile dysfunction.               Objective:     BP (!) 142/98   Pulse 84   Temp 98.8 F (37.1 C)   Ht 5\' 10"  (1.778 m)   Wt 194 lb 3.2 oz (88.1 kg)   SpO2 98% Comment: ra  BMI 27.86 kg/m   SpO2: 98 % (ra)    Obese amb wm nad    HEENT : pt wearing mask not removed for exam due to covid -19 concerns.    NECK :  without JVD/Nodes/TM/ nl carotid upstrokes bilaterally   LUNGS: no acc muscle use,  Nl contour chest which is clear to A and P bilaterally without cough on insp or exp maneuvers Rib popping L mid ax around C6/7  with intense chest wall  pain when pops are palpable   CV:  RRR  no s3 or murmur or increase in P2, and no edema   ABD:  obese soft and nontender with nl inspiratory excursion in the supine position. No bruits or organomegaly appreciated, bowel sounds nl  MS:  Nl gait/ ext warm without deformities, calf tenderness, cyanosis or clubbing No obvious joint restrictions   SKIN: warm and dry without lesions    NEURO:  alert, approp, nl sensorium with  no motor or cerebellar deficits apparent.      I personally reviewed images and agree with radiology impression as follows:  CXR:  8/24/ 2022 rib detail Multiple healed left rib fractures. Superimposed acute fracture of the left seventh rib. No pneumothorax.   Assessment   Delayed union of rib fracture, left Original injury to L chest wall > hit commode 09/07/20 > no response to gabapentin    Classic mscp from non-healed L rib fx > will ask Dr Roxan Hockey for his opinion and refer prn  COPD GOLD ? / active smoker Onset of symptoms in his 67s rx prn saba only as of 10/09/2021   - The proper method of use, as well as anticipated side effects, of a metered-dose inhaler were discussed and demonstrated to the patient using teach back method.   > 3 min discussion I reviewed the  Fletcher curve with the patient that basically indicates  if you quit smoking when your best day FEV1 is still well preserved (as is likely still the case here)  it is highly unlikely you will progress to severe disease and informed the patient there was  no medication on the market that has proven to alter the  curve/ its downward trajectory  or the likelihood of progression of their disease(unlike other chronic medical conditions such as atheroclerosis where we do think we can change the natural hx with risk reducing meds)    Therefore stopping smoking and maintaining abstinence are  the most important aspects of his care, not choice of inhalers or for that matter, doctors.   Treatment other than smoking cessation  is entirely directed by severity of symptoms and focused also on reducing exacerbations, not attempting to change the natural history of the disease.  Since no limiting symptoms on a typical day, nor tendency to aecopd, saba prn is fine (GROUP A rx)   >>> return for PFTs   Cigarette smoker Counseled re importance of smoking cessation but did not meet time criteria for separate billing    Low-dose CT lung cancer screening is recommended for patients who are 57-50 years of age with a 20+ pack-year history of smoking, and who are currently smoking or quit <=15 years ago. No coughing up blood  No unintentional weight loss of > 15 pounds in the last 6 months > he is eligible so can refer if not already being done thru the [place]         Each maintenance medication was reviewed in detail including emphasizing most importantly the difference between maintenance and prns and under what circumstances the prns are to be triggered using an action plan format where appropriate.  Total time for H and P, chart review, counseling, reviewing hfa  device(s) and generating customized AVS unique to this office visit / same day charting = 60 min           Christinia Gully, MD 10/09/2021

## 2021-10-09 NOTE — Addendum Note (Signed)
Addended by: Christinia Gully B on: 10/09/2021 05:14 PM   Modules accepted: Orders

## 2021-10-09 NOTE — Addendum Note (Signed)
Addended by: Fritzi Mandes D on: 10/09/2021 05:14 PM   Modules accepted: Orders

## 2021-10-09 NOTE — Assessment & Plan Note (Addendum)
Counseled re importance of smoking cessation but did not meet time criteria for separate billing    Low-dose CT lung cancer screening is recommended for patients who are 32-67 years of age with a 20+ pack-year history of smoking, and who are currently smoking or quit <=15 years ago. No coughing up blood  No unintentional weight loss of > 15 pounds in the last 6 months > he is eligible so can refer if not already being done thru the [place]         Each maintenance medication was reviewed in detail including emphasizing most importantly the difference between maintenance and prns and under what circumstances the prns are to be triggered using an action plan format where appropriate.  Total time for H and P, chart review, counseling, reviewing hfa  device(s) and generating customized AVS unique to this ne pt office visit / same day charting = 60 min

## 2021-10-09 NOTE — Assessment & Plan Note (Addendum)
Original injury to L chest wall > hit commode 09/07/20 > no response to gabapentin    Classic mscp from non-healed L rib fx > will ask Dr Roxan Hockey for his opinion and refer prn Add:  Discussed with Dr Roxan Hockey, needs fresh CT chest and outpt consultation/ ordered

## 2021-10-16 ENCOUNTER — Other Ambulatory Visit: Payer: Self-pay

## 2021-10-16 ENCOUNTER — Other Ambulatory Visit (HOSPITAL_COMMUNITY)
Admission: RE | Admit: 2021-10-16 | Discharge: 2021-10-16 | Disposition: A | Discharge status: Home / Self Care | Payer: Medicare Other | Source: Ambulatory Visit | Attending: Internal Medicine | Admitting: Internal Medicine

## 2021-10-16 DIAGNOSIS — Z01812 Encounter for preprocedural laboratory examination: Secondary | ICD-10-CM | POA: Insufficient documentation

## 2021-10-16 DIAGNOSIS — Z20822 Contact with and (suspected) exposure to covid-19: Secondary | ICD-10-CM | POA: Diagnosis not present

## 2021-10-16 DIAGNOSIS — Z01818 Encounter for other preprocedural examination: Secondary | ICD-10-CM

## 2021-10-16 LAB — SARS CORONAVIRUS 2 (TAT 6-24 HRS): SARS Coronavirus 2: NEGATIVE

## 2021-10-18 ENCOUNTER — Ambulatory Visit (HOSPITAL_COMMUNITY)
Admission: RE | Admit: 2021-10-18 | Discharge: 2021-10-18 | Disposition: A | Discharge status: Home / Self Care | Payer: Medicare Other | Source: Ambulatory Visit | Attending: Internal Medicine | Admitting: Internal Medicine

## 2021-10-18 DIAGNOSIS — J449 Chronic obstructive pulmonary disease, unspecified: Secondary | ICD-10-CM | POA: Insufficient documentation

## 2021-10-18 LAB — PULMONARY FUNCTION TEST
DL/VA % pred: 45 %
DL/VA: 1.85 ml/min/mmHg/L
DLCO unc % pred: 41 %
DLCO unc: 11.1 ml/min/mmHg
FEF 25-75 Post: 1.57 L/sec
FEF 25-75 Pre: 0.81 L/sec
FEF2575-%Change-Post: 93 %
FEF2575-%Pred-Post: 59 %
FEF2575-%Pred-Pre: 30 %
FEV1-%Change-Post: 16 %
FEV1-%Pred-Post: 71 %
FEV1-%Pred-Pre: 60 %
FEV1-Post: 2.39 L
FEV1-Pre: 2.05 L
FEV1FVC-%Change-Post: 1 %
FEV1FVC-%Pred-Pre: 80 %
FEV6-%Change-Post: 16 %
FEV6-%Pred-Post: 87 %
FEV6-%Pred-Pre: 75 %
FEV6-Post: 3.77 L
FEV6-Pre: 3.23 L
FEV6FVC-%Change-Post: 0 %
FEV6FVC-%Pred-Post: 100 %
FEV6FVC-%Pred-Pre: 99 %
FVC-%Change-Post: 15 %
FVC-%Pred-Post: 87 %
FVC-%Pred-Pre: 75 %
FVC-Post: 3.95 L
FVC-Pre: 3.42 L
Post FEV1/FVC ratio: 61 %
Post FEV6/FVC ratio: 96 %
Pre FEV1/FVC ratio: 60 %
Pre FEV6/FVC Ratio: 95 %
RV % pred: 197 %
RV: 4.73 L
TLC % pred: 129 %
TLC: 9.11 L

## 2021-10-18 MED ORDER — ALBUTEROL SULFATE (2.5 MG/3ML) 0.083% IN NEBU
2.5000 mg | INHALATION_SOLUTION | Freq: Once | RESPIRATORY_TRACT | Status: AC
  Administered 2021-10-18: 2.5 mg via RESPIRATORY_TRACT

## 2021-10-23 ENCOUNTER — Telehealth: Payer: Self-pay | Admitting: Internal Medicine

## 2021-10-23 ENCOUNTER — Other Ambulatory Visit: Payer: Self-pay | Admitting: Gastroenterology

## 2021-10-23 NOTE — Telephone Encounter (Signed)
Attempted to return call to patient. No answer. Will reattempt to reach patient.

## 2021-10-26 NOTE — Telephone Encounter (Signed)
Looked at pt's PFT results and saw where Meagan  had wanted to discuss something further with MW about the scan and had tried to call pt back.  Routing to [employer].

## 2021-10-26 NOTE — Telephone Encounter (Signed)
Patient is returning phone call. Patient phone number is [phone].

## 2021-10-29 NOTE — Progress Notes (Signed)
Called pt and there was no answer-LMTCB °

## 2021-10-29 NOTE — Telephone Encounter (Signed)
ATC patient.  LMTCB. 

## 2021-10-30 ENCOUNTER — Encounter: Payer: Non-veteran care | Admitting: Thoracic Surgery (Cardiothoracic Vascular Surgery)

## 2021-11-08 ENCOUNTER — Telehealth: Payer: Self-pay | Admitting: Internal Medicine

## 2021-11-09 NOTE — Telephone Encounter (Signed)
ATC patient. Line gave busy signal. Will go ahead and print and mail results.   Addressed in previous phone encounter. Nothing further needed.

## 2021-11-09 NOTE — Telephone Encounter (Signed)
Pt calling back for breathing results.  Margie wasn't able to hear him and called on a different number.  Requested a copy be printed and mailed to him as well.  [phone].

## 2021-11-09 NOTE — Telephone Encounter (Signed)
Called patient back and he req. A print out of his results.    Mailed out papers with results.   Gave patient RDS office phone number.   Nothing further needed.

## 2021-11-09 NOTE — Telephone Encounter (Signed)
ATC patient. I was unable to hear patient clearly. He stated that he would call back from a different phone.

## 2021-11-21 ENCOUNTER — Other Ambulatory Visit: Payer: Self-pay

## 2021-11-21 ENCOUNTER — Ambulatory Visit (HOSPITAL_COMMUNITY)
Admission: RE | Admit: 2021-11-21 | Discharge: 2021-11-21 | Disposition: A | Discharge status: Home / Self Care | Payer: Medicare Other | Source: Ambulatory Visit | Attending: Internal Medicine | Admitting: Internal Medicine

## 2021-11-21 DIAGNOSIS — J439 Emphysema, unspecified: Secondary | ICD-10-CM | POA: Diagnosis not present

## 2021-11-21 DIAGNOSIS — S2232XG Fracture of one rib, left side, subsequent encounter for fracture with delayed healing: Secondary | ICD-10-CM | POA: Diagnosis not present

## 2021-11-21 DIAGNOSIS — R0789 Other chest pain: Secondary | ICD-10-CM | POA: Insufficient documentation

## 2021-11-21 DIAGNOSIS — R079 Chest pain, unspecified: Secondary | ICD-10-CM | POA: Diagnosis not present

## 2021-11-21 DIAGNOSIS — I700 Atherosclerosis of aorta: Secondary | ICD-10-CM | POA: Diagnosis not present

## 2021-11-27 ENCOUNTER — Encounter: Payer: Non-veteran care | Admitting: Thoracic Surgery (Cardiothoracic Vascular Surgery)

## 2021-11-28 ENCOUNTER — Ambulatory Visit (INDEPENDENT_AMBULATORY_CARE_PROVIDER_SITE_OTHER): Payer: Medicare Other | Admitting: Gastroenterology

## 2021-11-28 ENCOUNTER — Encounter: Payer: Self-pay | Admitting: Gastroenterology

## 2021-11-28 ENCOUNTER — Other Ambulatory Visit: Payer: Self-pay

## 2021-11-28 VITALS — BP 145/96 | HR 101 | Temp 97.5°F | Ht 69.0 in | Wt 193.8 lb

## 2021-11-28 DIAGNOSIS — Z860100 Personal history of colon polyps, unspecified: Secondary | ICD-10-CM | POA: Insufficient documentation

## 2021-11-28 DIAGNOSIS — K2100 Gastro-esophageal reflux disease with esophagitis, without bleeding: Secondary | ICD-10-CM

## 2021-11-28 DIAGNOSIS — K219 Gastro-esophageal reflux disease without esophagitis: Secondary | ICD-10-CM | POA: Insufficient documentation

## 2021-11-28 DIAGNOSIS — K529 Noninfective gastroenteritis and colitis, unspecified: Secondary | ICD-10-CM | POA: Diagnosis not present

## 2021-11-28 MED ORDER — OMEPRAZOLE 20 MG PO CPDR: 20.0000 mg | DELAYED_RELEASE_CAPSULE | Freq: Every day | ORAL | 3 refills | Status: DC

## 2021-11-28 NOTE — Progress Notes (Signed)
Referring Provider: [name], MD Primary Care Physician:  [name], MD Primary GI: Dr. Abbey Chatters   Chief Complaint  Patient presents with   Follow-up    Refill on Budesonide and Omerazole    HPI:   Garrett Klein. is a 66 y.o. male presenting today with a history of collagenous colitis diagnosed in 2014. He has a remote history of polyps many years ago. Last colonoscopy 2018 with hyperplastic polyps. He is due for 5 year surveillance.   Started having looser stools again. Postprandial loose stools. Numerous stools per day. Eats a pack of Nabs and goes right through him. Same to prior symptoms. Had improvement with budesonide in the past. 2 months ago started again. Stomach was cramping in the LLQ side intermittently over the years. No rectal bleeding. No recent antibiotics. No sick contacts.   History of chronic GERD. Ran out of omeprazole. He is symptomatic if does not take this. No dysphagia.       Past Medical History:  Diagnosis Date   Atrial flutter ([place])    Chronic lower back pain    Collagenous colitis 2014   [facility]   COPD (chronic obstructive pulmonary disease) ([place])    Coronary artery calcification seen on CT scan    Essential hypertension    History of atrial flutter    Hyperlipidemia    MRSA infection    PUD (peptic ulcer disease)     Past Surgical History:  Procedure Laterality Date   BACK SURGERY     BIOPSY  06/03/2017   Procedure: BIOPSY;  Surgeon: Danie Binder, MD;  [facility];  Service: Endoscopy;;  colon   COLONOSCOPY  2001   Dr. Sharlett Iles, normal   COLONOSCOPY  04/2013   [facility]: Fentanyl 200mg Danford Bad 10mg : sigmoid diverticulosis, normal terminal ileum, small internal hemorrhoids, random colon bx: collagenous colitis. next tcs 04/2018   COLONOSCOPY  2011   [facility]: two polyps, sessile serrated adenoma, mild diverticulosis   COLONOSCOPY WITH PROPOFOL N/A 06/03/2017   Dr. Oneida Alar: Diverticulosis, three 2 to 3 mm  polyps removed from the mid transverse colon and cecum, three 4 to 6 mm polyps in the rectum and mid descending colon removed.  External/internal hemorrhoids.  hyperplastic polyps.  Random colon biopsies negative for microscopic colitis. next colonoscopy in 5 years.   ELBOW BURSA SURGERY     ESOPHAGEAL DILATION N/A 05/19/2015   Procedure: ESOPHAGEAL DILATION;  Surgeon: Danie Binder, MD;  Location: [facility];  Service: Endoscopy;  Laterality: N/A;   ESOPHAGOGASTRODUODENOSCOPY N/A 10/28/2014   [facility]: 1. Esophagitis due to GERD. KOH neg. 2. Small hiatal hernia 3. Moderate erosive gastritis 4. RUQ pain due to large ulcer 5. University duodentis in the bulb.    ESOPHAGOGASTRODUODENOSCOPY N/A 05/19/2015   [facility]: 1. Mild distal esophagitis 2. Peptic stricture at the gastroesophageal junction 3. Mild non-erosive gastritis 4. Pseudo pylorus due to prior PUD.    ESOPHAGOGASTRODUODENOSCOPY N/A 03/07/2017   Dr. Oneida Alar: Benign-appearing esophageal stenosis status post dilation, mild gastritis   FINGER AMPUTATION Left 1985   index finger   HYDROCELE EXCISION Right 10/02/2018   Procedure: HYDROCELECTOMY ADULT;  Surgeon: Irine Seal, MD;  Location: [facility];  Service: Urology;  Laterality: Right;   JOINT REPLACEMENT     left hip surgery - January 26, 2014   POLYPECTOMY  06/03/2017   Procedure: POLYPECTOMY;  Surgeon: Danie Binder, MD;  Location[facility];  Service: Endoscopy;;  colon   ROTATOR CUFF REPAIR     November 2014   SAVORY DILATION N/A 03/07/2017   Procedure: SAVORY DILATION;  Surgeon: Danie Binder, MD;  [facility];  Service: Endoscopy;  Laterality: N/A;    Current Outpatient Medications  Medication Sig Dispense Refill   albuterol (PROVENTIL HFA;VENTOLIN HFA) 108 (90 BASE) MCG/ACT inhaler Inhale 2 puffs into the lungs See admin instructions. Inhale 1-2 puffs scheduled every morning & every 6 hours as needed for wheezing/shortness of breath.     apixaban (ELIQUIS) 5 MG TABS tablet Take 1  tablet (5 mg total) by mouth 2 (two) times daily. 60 tablet 6   atorvastatin (LIPITOR) 40 MG tablet Take 40 mg by mouth daily.      cholecalciferol (VITAMIN D3) 25 MCG (1000 UNIT) tablet Take 4,000 Units by mouth daily.     Coenzyme Q10 (CO Q 10) 100 MG CAPS Take 1 capsule by mouth daily at 6 (six) AM.     diltiazem (CARDIZEM CD) 240 MG 24 hr capsule TAKE 1 CAPSULE BY MOUTH  DAILY 90 capsule 3   EPINEPHrine 0.3 mg/0.3 mL IJ SOAJ injection Inject 0.3 mg into the muscle as needed (allergic reaction).      Glucosamine-Chondroitin 750-600 MG TABS Take 1 tablet by mouth in the morning and at bedtime.     omeprazole (PRILOSEC) 20 MG capsule Take 1 capsule (20 mg total) by mouth daily. 30 minutes before breakfast 90 capsule 3   sildenafil (VIAGRA) 100 MG tablet Take 100 mg by mouth daily as needed for erectile dysfunction.     budesonide (ENTOCORT EC) 3 MG 24 hr capsule Take 3 capsules (9 mg total) by mouth daily for 30 days, THEN 2 capsules (6 mg total) daily for 14 days, THEN 1 capsule (3 mg total) daily for 14 days. 132 capsule 0   No current facility-administered medications for this visit.    Allergies as of 11/28/2021 - Review Complete 11/28/2021  Allergen Reaction Noted   Bee venom Swelling 06/21/2012   Ciprofloxacin Other (See Comments) 08/06/2016   Levaquin [levofloxacin in d5w] Other (See Comments) 12/11/2016   Tramadol Hives 10/18/2014   Hydrocodone Rash 03/28/2013   Sulfa antibiotics Rash 03/28/2013    Family History  Problem Relation Age of Onset   Ovarian cancer Mother    Diabetes type II Father    Heart attack Father    Arthritis Sister    Stroke Brother    Coronary artery disease Brother    Diabetes Other    Heart attack Other    Colon cancer Neg Hx    Liver disease Neg Hx    Ulcers Neg Hx     Social History   Socioeconomic History   Marital status: Widowed    Spouse name: Not on file   Number of children: Not on file   Years of education: Not on file   Highest  education level: Not on file  Occupational History   Not on file  Tobacco Use   Smoking status: Every Day    Packs/day: 0.50    Years: 40.00    Pack years: 20.00    Types: Cigarettes    Start date: 01/09/1969   Smokeless tobacco: Never  Vaping Use   Vaping Use: Never used  Substance and Sexual Activity   Alcohol use: No    Alcohol/week: 0.0 standard drinks   Drug use: No   Sexual activity: Not on file  Other Topics Concern   Not  on file  Social History Narrative   Not on file   Social Determinants of Health   Financial Resource Strain: Not on file  Food Insecurity: Not on file  Transportation Needs: Not on file  Physical Activity: Not on file  Stress: Not on file  Social Connections: Not on file    Review of Systems: Gen: Denies fever, chills, anorexia. Denies fatigue, weakness, weight loss.  CV: Denies chest pain, palpitations, syncope, peripheral edema, and claudication. Resp: Denies dyspnea at rest, cough, wheezing, coughing up blood, and pleurisy. GI: see HPI Derm: Denies rash, itching, dry skin Psych: Denies depression, anxiety, memory loss, confusion. No homicidal or suicidal ideation.  Heme: Denies bruising, bleeding, and enlarged lymph nodes.  Physical Exam: BP (!) 145/96    Pulse (!) 101    Temp (!) 97.5 F (36.4 C) (Temporal)    Ht 5\' 9"  (1.753 m)    Wt 193 lb 12.8 oz (87.9 kg)    BMI 28.62 kg/m  General:   Alert and oriented. No distress noted. Pleasant and cooperative.  Head:  Normocephalic and atraumatic. Eyes:  Conjuctiva clear without scleral icterus. Mouth:  mask in place Abdomen:  +BS, soft, non-tender and non-distended. No rebound or guarding. No HSM or masses noted. Msk:  Symmetrical without gross deformities. Normal posture. Extremities:  Without edema. Neurologic:  Alert and  oriented x4 Psych:  Alert and cooperative. Normal mood and affect.  ASSESSMENT: Garrett Klein. is a 66 y.o. male presenting today  with a history of collagenous  colitis diagnosed in 2014. He has a remote history of polyps many years ago. Last colonoscopy 2018 with hyperplastic polyps. He is due for 5 year surveillance.   Collagenous colitis: now with recurrent loose stools. I have checked GI path panel (which included Cdiff in panel), and this was negative. Although it was not the 2 step process Cdiff, I doubt he has acute etiology. Will start on course of budesonide now, as he has responded well to this in the past.   History of colon polyps: due for surveillance colonoscopy now. Can consider random colonic biopsies at time of colonoscopy if persistent diarrhea.   History of chronic GERD: unable to come off PPI. Will refill low dose omeprazole. Discussed that this could increase risk of collagenous colitis; however, he is unable to be off PPI without significant symptoms.   PLAN:  Stool studies completed (GI path panel negative)  Start empiric course of budesonide with taper: sent to pharmacy.   Proceed with colonoscopy by Dr. Abbey Chatters  in near future due to history of polyps. Consider random colonic biopsy at that time: the risks, benefits, and alternatives have been discussed with the patient in detail. The patient states understanding and desires to proceed.   Hold Eliquis 48 hours prior

## 2021-11-28 NOTE — Patient Instructions (Signed)
Please have stool studies completed. Once we make sure nothing infectious, I can send in budesonide.  We are arranging a colonoscopy in the future!  You will need to stop Eliquis 48 hours prior to the procedure.  Further recommendations to follow!  It was a pleasure to see you today. I want to create trusting relationships with patients to provide genuine, compassionate, and quality care. I value your feedback. If you receive a survey regarding your visit,  I greatly appreciate you taking time to fill this out.   Annitta Needs, PhD, ANP-BC [facility]

## 2021-11-29 ENCOUNTER — Encounter: Payer: Medicaid Other | Admitting: Thoracic Surgery (Cardiothoracic Vascular Surgery)

## 2021-12-06 DIAGNOSIS — K529 Noninfective gastroenteritis and colitis, unspecified: Secondary | ICD-10-CM | POA: Diagnosis not present

## 2021-12-07 LAB — GASTROINTESTINAL PATHOGEN PANEL PCR
C. difficile Tox A/B, PCR: NOT DETECTED
Campylobacter, PCR: NOT DETECTED
Cryptosporidium, PCR: NOT DETECTED
E coli (ETEC) LT/ST PCR: NOT DETECTED
E coli (STEC) stx1/stx2, PCR: NOT DETECTED
E coli 0157, PCR: NOT DETECTED
Giardia lamblia, PCR: NOT DETECTED
Norovirus, PCR: NOT DETECTED
Rotavirus A, PCR: NOT DETECTED
Salmonella, PCR: NOT DETECTED
Shigella, PCR: NOT DETECTED

## 2021-12-11 ENCOUNTER — Encounter: Payer: Self-pay | Admitting: *Deleted

## 2021-12-11 ENCOUNTER — Other Ambulatory Visit: Payer: Self-pay | Admitting: Gastroenterology

## 2021-12-11 ENCOUNTER — Telehealth: Payer: Self-pay | Admitting: *Deleted

## 2021-12-11 MED ORDER — PEG 3350-KCL-NA BICARB-NACL 420 G PO SOLR: ORAL | 0 refills | Status: DC

## 2021-12-11 MED ORDER — BUDESONIDE 3 MG PO CPEP: ORAL_CAPSULE | ORAL | 0 refills | Status: AC

## 2021-12-11 NOTE — Telephone Encounter (Signed)
Called pt and made aware of pre-op appt details. He voiced understanding

## 2021-12-19 ENCOUNTER — Telehealth: Payer: Self-pay

## 2021-12-19 NOTE — Telephone Encounter (Signed)
Torrie Mayers, Your signature and printed name is needed for [facility] lab where I had to find another code to cover the pt's lab. This will be on your desk in a yellow folder with your initials on the front

## 2021-12-25 ENCOUNTER — Institutional Professional Consult (permissible substitution) (INDEPENDENT_AMBULATORY_CARE_PROVIDER_SITE_OTHER): Payer: Medicare Other | Admitting: Thoracic Surgery (Cardiothoracic Vascular Surgery)

## 2021-12-25 ENCOUNTER — Other Ambulatory Visit: Payer: Self-pay

## 2021-12-25 VITALS — BP 107/72 | HR 112 | Resp 20 | Ht 69.0 in

## 2021-12-25 DIAGNOSIS — S2232XG Fracture of one rib, left side, subsequent encounter for fracture with delayed healing: Secondary | ICD-10-CM | POA: Diagnosis not present

## 2021-12-25 NOTE — Progress Notes (Signed)
PCP is [name], MD Referring Provider is Tanda Rockers, MD  Chief Complaint  Patient presents with   Consult    Delayed rib fracture, CT chest 1/4    HPI: [name] sent for consultation regarding rib fractures.  Garrett Klein is a 66 year old man with a past medical history significant for tobacco abuse, COPD, coronary calcification, atrial flutter, hypertension, hyperlipidemia, peptic ulcer disease and collagenous colitis.  Back in 2021 he fell out of the shower and hit the commode.  He landed on the posterior part of his right chest.  He suffered multiple rib fractures.  He continues to have pain in his back.  It interrupts his sleep as he has difficulty lying down.  He does often feel clicking or popping.   Past Medical History:  Diagnosis Date   Atrial flutter ([place])    Chronic lower back pain    Collagenous colitis 2014   [facility]   COPD (chronic obstructive pulmonary disease) ([place])    Coronary artery calcification seen on CT scan    Essential hypertension    History of atrial flutter    Hyperlipidemia    MRSA infection    PUD (peptic ulcer disease)     Past Surgical History:  Procedure Laterality Date   BACK SURGERY     BIOPSY  06/03/2017   Procedure: BIOPSY;  Surgeon: Danie Binder, MD;  [facility];  Service: Endoscopy;;  colon   COLONOSCOPY  2001   Dr. Sharlett Iles, normal   COLONOSCOPY  04/2013   [facility]: Fentanyl 200mg Danford Bad 10mg : sigmoid diverticulosis, normal terminal ileum, small internal hemorrhoids, random colon bx: collagenous colitis. next tcs 04/2018   COLONOSCOPY  2011   [facility]: two polyps, sessile serrated adenoma, mild diverticulosis   COLONOSCOPY WITH PROPOFOL N/A 06/03/2017   Dr. Oneida Alar: Diverticulosis, three 2 to 3 mm polyps removed from the mid transverse colon and cecum, three 4 to 6 mm polyps in the rectum and mid descending colon removed.  External/internal hemorrhoids.  hyperplastic polyps.  Random colon biopsies negative for  microscopic colitis. next colonoscopy in 5 years.   ELBOW BURSA SURGERY     ESOPHAGEAL DILATION N/A 05/19/2015   Procedure: ESOPHAGEAL DILATION;  Surgeon: Danie Binder, MD;  [facility];  Service: Endoscopy;  Laterality: N/A;   ESOPHAGOGASTRODUODENOSCOPY N/A 10/28/2014   [facility]: 1. Esophagitis due to GERD. KOH neg. 2. Small hiatal hernia 3. Moderate erosive gastritis 4. RUQ pain due to large ulcer 5. Cameron duodentis in the bulb.    ESOPHAGOGASTRODUODENOSCOPY N/A 05/19/2015   [facility]: 1. Mild distal esophagitis 2. Peptic stricture at the gastroesophageal junction 3. Mild non-erosive gastritis 4. Pseudo pylorus due to prior PUD.    ESOPHAGOGASTRODUODENOSCOPY N/A 03/07/2017   Dr. Oneida Alar: Benign-appearing esophageal stenosis status post dilation, mild gastritis   FINGER AMPUTATION Left 1985   index finger   HYDROCELE EXCISION Right 10/02/2018   Procedure: HYDROCELECTOMY ADULT;  Surgeon: Irine Seal, MD;  Location: [facility];  Service: Urology;  Laterality: Right;   JOINT REPLACEMENT     left hip surgery - January 26, 2014   POLYPECTOMY  06/03/2017   Procedure: POLYPECTOMY;  Surgeon: Danie Binder, MD;  [facility];  Service: Endoscopy;;  colon   ROTATOR CUFF REPAIR     November 2014   SAVORY DILATION N/A 03/07/2017   Procedure: SAVORY DILATION;  Surgeon: Danie Binder, MD;  [facility];  Service: Endoscopy;  Laterality: N/A;  Family History  Problem Relation Age of Onset   Ovarian cancer Mother    Diabetes type II Father    Heart attack Father    Arthritis Sister    Stroke Brother    Coronary artery disease Brother    Diabetes Other    Heart attack Other    Colon cancer Neg Hx    Liver disease Neg Hx    Ulcers Neg Hx     Social History Social History   Tobacco Use   Smoking status: Every Day    Packs/day: 0.50    Years: 40.00    Pack years: 20.00    Types: Cigarettes    Start date: 01/09/1969   Smokeless tobacco: Never  Vaping Use   Vaping Use:  Never used  Substance Use Topics   Alcohol use: No    Alcohol/week: 0.0 standard drinks   Drug use: No    Current Outpatient Medications  Medication Sig Dispense Refill   albuterol (PROVENTIL HFA;VENTOLIN HFA) 108 (90 BASE) MCG/ACT inhaler Inhale 2 puffs into the lungs See admin instructions. Inhale 1-2 puffs scheduled every morning & every 6 hours as needed for wheezing/shortness of breath.     apixaban (ELIQUIS) 5 MG TABS tablet Take 1 tablet (5 mg total) by mouth 2 (two) times daily. 60 tablet 6   atorvastatin (LIPITOR) 40 MG tablet Take 40 mg by mouth daily.      budesonide (ENTOCORT EC) 3 MG 24 hr capsule Take 3 capsules (9 mg total) by mouth daily for 30 days, THEN 2 capsules (6 mg total) daily for 14 days, THEN 1 capsule (3 mg total) daily for 14 days. 132 capsule 0   cholecalciferol (VITAMIN D3) 25 MCG (1000 UNIT) tablet Take 4,000 Units by mouth daily.     Coenzyme Q10 (CO Q 10) 100 MG CAPS Take 1 capsule by mouth daily at 6 (six) AM.     diltiazem (CARDIZEM CD) 240 MG 24 hr capsule TAKE 1 CAPSULE BY MOUTH  DAILY 90 capsule 3   EPINEPHrine 0.3 mg/0.3 mL IJ SOAJ injection Inject 0.3 mg into the muscle as needed (allergic reaction).      Glucosamine-Chondroitin 750-600 MG TABS Take 1 tablet by mouth in the morning and at bedtime.     omeprazole (PRILOSEC) 20 MG capsule Take 1 capsule (20 mg total) by mouth daily. 30 minutes before breakfast 90 capsule 3   polyethylene glycol-electrolytes (NULYTELY) 420 g solution As directed 4000 mL 0   sildenafil (VIAGRA) 100 MG tablet Take 100 mg by mouth daily as needed for erectile dysfunction.     No current facility-administered medications for this visit.    Allergies  Allergen Reactions   Bee Venom Swelling   Ciprofloxacin Other (See Comments)    Turns red from the chest up.   Levaquin [Levofloxacin In D5w] Other (See Comments)    CANDIDA ALL OVER MOUTH TO PENIS   Tramadol Hives        Hydrocodone Rash   Sulfa Antibiotics Rash     Review of Systems  HENT:  Negative for trouble swallowing and voice change.   Respiratory:  Positive for cough. Negative for shortness of breath.   Cardiovascular:  Negative for chest pain.  Musculoskeletal:  Positive for arthralgias, back pain and joint swelling.  Hematological:  Bruises/bleeds easily.  All other systems reviewed and are negative.  BP 107/72 (BP Location: Left Arm, Patient Position: Sitting)    Pulse (!) 112    Resp 20  Ht 5\' 9"  (1.753 m)    SpO2 93% Comment: RA   BMI 28.62 kg/m  Physical Exam Vitals reviewed.  Constitutional:      General: He is not in acute distress.    Appearance: Normal appearance.  HENT:     Head: Normocephalic and atraumatic.  Eyes:     General: No scleral icterus.    Extraocular Movements: Extraocular movements intact.  Cardiovascular:     Rate and Rhythm: Normal rate and regular rhythm.     Heart sounds: Normal heart sounds. No murmur heard.   No friction rub. No gallop.  Pulmonary:     Effort: Pulmonary effort is normal. No respiratory distress.     Breath sounds: No wheezing or rales.  Abdominal:     General: There is no distension.     Palpations: Abdomen is soft.  Musculoskeletal:        General: Tenderness (Paraspinal right from tip of scapula to inferior chest wall) present.     Cervical back: Neck supple.  Skin:    General: Skin is warm and dry.  Neurological:     General: No focal deficit present.     Mental Status: He is alert and oriented to person, place, and time.     Cranial Nerves: No cranial nerve deficit.     Motor: No weakness.    Diagnostic Tests: CT CHEST WITHOUT CONTRAST   TECHNIQUE: Multidetector CT imaging of the chest was performed following the standard protocol without IV contrast.   COMPARISON:  Rib radiographs 07/11/2021 most recent chest CT 04/21/2017   FINDINGS: Cardiovascular: Atherosclerosis of the thoracic aorta. No aortic aneurysm or periaortic stranding. Mild aortic tortuosity.  Heart is normal in size. Scattered coronary artery calcifications. No pericardial effusion.   Mediastinum/Nodes: Scattered small mediastinal lymph nodes are not enlarged by size criteria. Previous borderline left paratracheal node measuring 10 mm currently measures 8 mm, series 2, image 66, likely reactive. No bulky hilar adenopathy on this unenhanced exam. Tiny hiatal hernia. No esophageal wall thickening. No visualized thyroid nodule.   Lungs/Pleura: Emphysema. Tiny focus of fissural thickening, series 4, image 86, also seen on prior exam, likely a small intrapulmonary lymph node. This is stable for greater than 4 years and considered benign. No airspace consolidation. No pleural effusion. Mild central bronchial thickening. Intact trachea.   Upper Abdomen: No acute or unexpected findings.   Musculoskeletal: Multiple remote left rib fractures, some of which are segmental. Healed fractures of anterior left third, fourth, and fifth ribs with bridging callus. Posterior left fourth and fifth rib fractures have completely healed callus formation. Posterior left sixth through tenth rib fractures have no bony bridging, compatible with nonunion. Left anterior sixth rib fracture also with nonunion. There are no acute rib fractures. No focal bone lesion or bone destruction. Thoracic spondylosis with endplate spurring. Chronic changes of the right shoulder, postsurgical and degenerative. No chest wall soft tissue abnormalities.   IMPRESSION: 1. Multiple remote left rib fractures, some of which are segmental. Many of these rib fractures have healed with bony bridging. However, there is nonunion of posterior left sixth through tenth rib fractures without bony bridging. There is also nonunion of left anterior sixth rib fracture. 2. Emphysema. 3. Aortic atherosclerosis.  Coronary artery calcifications.   Aortic Atherosclerosis (ICD10-I70.0) and Emphysema (ICD10-J43.9).     Electronically  Signed   By: Keith Rake M.D.   On: 11/22/2021 00:19 I personally reviewed the CT images.  There are multiple rib fractures on the  left side.  Fractures of sixth through 10th ribs show nonhealing.  Impression: Garrett Klein is a 66 year old man with a past medical history significant for tobacco abuse, COPD, coronary calcification, atrial flutter, hypertension, hyperlipidemia, peptic ulcer disease and collagenous colitis.  He had a fall about a year and a half ago resulting in multiple rib fractures.  He has had pain associated with that ever since.  Unfortunately there are 2 issues working against [place] in this case.  1 is the chronicity.  Even if we placed the ribs he would likely continue to have pain.  The second and bigger issue is the location of the fractures which is very posterior.  This is a difficult area to plate effectively and in my experience plating has not been successful in that location.  Therefore, I would not recommend attempting to plate at this time.  Plan: Follow-up with primary Dr. Manuella Ghazi.  Melrose Nakayama, MD [facility] [phone]

## 2021-12-26 ENCOUNTER — Telehealth: Payer: Self-pay

## 2021-12-26 DIAGNOSIS — R1030 Lower abdominal pain, unspecified: Secondary | ICD-10-CM

## 2021-12-26 NOTE — Telephone Encounter (Signed)
Garrett Klein, does he need CT abd/pelvis w/contrast ordered as routine or ASAP?

## 2021-12-26 NOTE — Telephone Encounter (Signed)
Recommend CT abd/pelvis with contrast due to abdominal pain.

## 2021-12-26 NOTE — Telephone Encounter (Signed)
Phoned and spoke with the pt and pt is agreeable with CT abd/pelvis with contrast

## 2021-12-26 NOTE — Telephone Encounter (Signed)
Returned the pt's call to [phone] from phone message. Pt states 2 or 3 weeks he has this "God awful cramping in his lower abdomen area". It hits the scale at a 10 . Pt has no constipation. Sometimes after a BM he feels better but the pain starts back again. This has been happening since the pt came in for visit. He stopped taking the Budesonide last Monday because he thought that was the culprit. He see's it was not.now the pt is really hurting and wants to know what to do next.

## 2021-12-27 NOTE — Telephone Encounter (Signed)
No PA needed for CT abd/pelvis per [facility] website.  CT scheduled for 01/02/22 at 1:30pm,, arrive at 1:00pm. NPO 4 hours prior to test. Pickup contrast before day of test.  Called pt, informed him of CT appt. Advised him to arrive at 11:00am if he's not able to get the contrast before day of test. Letter mailed.

## 2021-12-27 NOTE — Telephone Encounter (Signed)
Recommend ASAP. Thanks

## 2021-12-27 NOTE — Addendum Note (Signed)
Addended by: Zara Council C on: 12/27/2021 12:00 PM   Modules accepted: Orders

## 2022-01-02 ENCOUNTER — Ambulatory Visit (HOSPITAL_COMMUNITY)
Admission: RE | Admit: 2022-01-02 | Discharge: 2022-01-02 | Disposition: A | Discharge status: Home / Self Care | Payer: Medicare Other | Source: Ambulatory Visit | Attending: Gastroenterology | Admitting: Gastroenterology

## 2022-01-02 ENCOUNTER — Other Ambulatory Visit: Payer: Self-pay

## 2022-01-02 DIAGNOSIS — R1030 Lower abdominal pain, unspecified: Secondary | ICD-10-CM | POA: Diagnosis not present

## 2022-01-02 DIAGNOSIS — K5730 Diverticulosis of large intestine without perforation or abscess without bleeding: Secondary | ICD-10-CM | POA: Diagnosis not present

## 2022-01-02 LAB — POCT I-STAT CREATININE: Creatinine, Ser: 0.9 mg/dL (ref 0.61–1.24)

## 2022-01-02 MED ORDER — IOHEXOL 300 MG/ML  SOLN
100.0000 mL | Freq: Once | INTRAMUSCULAR | Status: AC | PRN
  Administered 2022-01-02: 100 mL via INTRAVENOUS

## 2022-01-07 ENCOUNTER — Telehealth: Payer: Self-pay | Admitting: Internal Medicine

## 2022-01-07 NOTE — Telephone Encounter (Signed)
noted 

## 2022-01-07 NOTE — Telephone Encounter (Signed)
Sent in result note. Thanks!

## 2022-01-07 NOTE — Telephone Encounter (Signed)
Pt called to see if his CT results are back yet. [phone]

## 2022-01-07 NOTE — Telephone Encounter (Signed)
Pt is wanting the results of his CT scan. Just forward to me and I will call him.

## 2022-01-07 NOTE — Patient Instructions (Signed)
Garrett Klein Sr.  01/07/2022     @PREFPERIOPPHARMACY @   Your procedure is scheduled on  01/10/2022.   [facility] to [facility] at  [place]   Call this number if you have problems the morning of surgery:  [phone]   Remember:  Follow the diet and prep instructions given to you by the office.    Your last dose of eliquis should be 01/07/2022.    Take these medicines the morning of surgery with A SIP OF WATER                 entocort, diltiazem, gabapentin, prilosec.     Do not wear jewelry, make-up or nail polish.  Do not wear lotions, powders, or perfumes, or deodorant.  Do not shave 48 hours prior to surgery.  Men may shave face and neck.  Do not bring valuables to the hospital.  [facility] is not responsible for any belongings or valuables.  Contacts, dentures or bridgework may not be worn into surgery.  Leave your suitcase in the car.  After surgery it may be brought to your room.  For patients admitted to the hospital, discharge time will be determined by your treatment team.  Patients discharged the day of surgery will not be allowed to drive home and must have someone with them for 24 hours.    Special instructions:   DO NOT smoke tobacco or vape for 24 hours before your procedure.  Please read over the following fact sheets that you were given. Anesthesia Post-op Instructions and Care and Recovery After Surgery      Colonoscopy, Adult, Care After This sheet gives you information about how to care for yourself after your procedure. Your health care provider may also give you more specific instructions. If you have problems or questions, contact your health care provider. What can I expect after the procedure? After the procedure, it is common to have: A small amount of blood in your stool for 24 hours after the procedure. Some gas. Mild cramping or bloating of your abdomen. Follow these instructions at home: Eating and drinking  Drink enough  fluid to keep your urine pale yellow. Follow instructions from your health care provider about eating or drinking restrictions. Resume your normal diet as instructed by your health care provider. Avoid heavy or fried foods that are hard to digest. Activity Rest as told by your health care provider. Avoid sitting for a long time without moving. Get up to take short walks every 1-2 hours. This is important to improve blood flow and breathing. Ask for help if you feel weak or unsteady. Return to your normal activities as told by your health care provider. Ask your health care provider what activities are safe for you. Managing cramping and bloating  Try walking around when you have cramps or feel bloated. Apply heat to your abdomen as told by your health care provider. Use the heat source that your health care provider recommends, such as a moist heat pack or a heating pad. Place a towel between your skin and the heat source. Leave the heat on for 20-30 minutes. Remove the heat if your skin turns bright red. This is especially important if you are unable to feel pain, heat, or cold. You may have a greater risk of getting burned. General instructions If you were given a sedative during the procedure, it can affect you for several hours. Do not drive or operate machinery  until your health care provider says that it is safe. For the first 24 hours after the procedure: Do not sign important documents. Do not drink alcohol. Do your regular daily activities at a slower pace than normal. Eat soft foods that are easy to digest. Take over-the-counter and prescription medicines only as told by your health care provider. Keep all follow-up visits as told by your health care provider. This is important. Contact a health care provider if: You have blood in your stool 2-3 days after the procedure. Get help right away if you have: More than a small spotting of blood in your stool. Large blood clots in your  stool. Swelling of your abdomen. Nausea or vomiting. A fever. Increasing pain in your abdomen that is not relieved with medicine. Summary After the procedure, it is common to have a small amount of blood in your stool. You may also have mild cramping and bloating of your abdomen. If you were given a sedative during the procedure, it can affect you for several hours. Do not drive or operate machinery until your health care provider says that it is safe. Get help right away if you have a lot of blood in your stool, nausea or vomiting, a fever, or increased pain in your abdomen. This information is not intended to replace advice given to you by your health care provider. Make sure you discuss any questions you have with your health care provider. Document Revised: 09/10/2019 Document Reviewed: 05/31/2019 Elsevier Patient Education  [place] After This sheet gives you information about how to care for yourself after your procedure. Your health care provider may also give you more specific instructions. If you have problems or questions, contact your health care provider. What can I expect after the procedure? After the procedure, it is common to have: Tiredness. Forgetfulness about what happened after the procedure. Impaired judgment for important decisions. Nausea or vomiting. Some difficulty with balance. Follow these instructions at home: For the time period you were told by your health care provider:   Rest as needed. Do not participate in activities where you could fall or become injured. Do not drive or use machinery. Do not drink alcohol. Do not take sleeping pills or medicines that cause drowsiness. Do not make important decisions or sign legal documents. Do not take care of children on your own. Eating and drinking Follow the diet that is recommended by your health care provider. Drink enough fluid to keep your urine pale yellow. If  you vomit: Drink water, juice, or soup when you can drink without vomiting. Make sure you have little or no nausea before eating solid foods. General instructions Have a responsible adult stay with you for the time you are told. It is important to have someone help care for you until you are awake and alert. Take over-the-counter and prescription medicines only as told by your health care provider. If you have sleep apnea, surgery and certain medicines can increase your risk for breathing problems. Follow instructions from your health care provider about wearing your sleep device: Anytime you are sleeping, including during daytime naps. While taking prescription pain medicines, sleeping medicines, or medicines that make you drowsy. Avoid smoking. Keep all follow-up visits as told by your health care provider. This is important. Contact a health care provider if: You keep feeling nauseous or you keep vomiting. You feel light-headed. You are still sleepy or having trouble with balance after 24 hours. You develop a  rash. You have a fever. You have redness or swelling around the IV site. Get help right away if: You have trouble breathing. You have new-onset confusion at home. Summary For several hours after your procedure, you may feel tired. You may also be forgetful and have poor judgment. Have a responsible adult stay with you for the time you are told. It is important to have someone help care for you until you are awake and alert. Rest as told. Do not drive or operate machinery. Do not drink alcohol or take sleeping pills. Get help right away if you have trouble breathing, or if you suddenly become confused. This information is not intended to replace advice given to you by your health care provider. Make sure you discuss any questions you have with your health care provider. Document Revised: 07/20/2020 Document Reviewed: 10/07/2019 Elsevier Patient Education  2022 [employer].

## 2022-01-08 ENCOUNTER — Encounter (HOSPITAL_COMMUNITY): Payer: Self-pay

## 2022-01-08 ENCOUNTER — Encounter (HOSPITAL_COMMUNITY)
Admission: RE | Admit: 2022-01-08 | Discharge: 2022-01-08 | Disposition: A | Discharge status: Home / Self Care | Payer: Medicare Other | Source: Ambulatory Visit | Attending: Internal Medicine | Admitting: Internal Medicine

## 2022-01-08 ENCOUNTER — Other Ambulatory Visit: Payer: Self-pay

## 2022-01-08 NOTE — Pre-Procedure Instructions (Signed)
To Whom it may concern,                          Bela Nyborg medicaid id number [id number] M, was at [facility] today for his pre- procedure appointment.      If you have any questions, call us at [phone]. Thank you.                                             Encarnacion Chu, RN

## 2022-01-10 ENCOUNTER — Ambulatory Visit (HOSPITAL_COMMUNITY): Payer: Medicare Other | Admitting: Anesthesiology

## 2022-01-10 ENCOUNTER — Ambulatory Visit (HOSPITAL_COMMUNITY)
Admission: RE | Admit: 2022-01-10 | Discharge: 2022-01-10 | Disposition: A | Discharge status: Home / Self Care | Payer: Medicare Other | Attending: Internal Medicine | Admitting: Internal Medicine

## 2022-01-10 ENCOUNTER — Ambulatory Visit (HOSPITAL_BASED_OUTPATIENT_CLINIC_OR_DEPARTMENT_OTHER): Payer: Medicare Other | Admitting: Anesthesiology

## 2022-01-10 ENCOUNTER — Encounter (HOSPITAL_COMMUNITY)
Admission: RE | Disposition: A | Discharge status: Home / Self Care | Payer: Self-pay | Source: Home / Self Care | Attending: Internal Medicine

## 2022-01-10 ENCOUNTER — Encounter (HOSPITAL_COMMUNITY): Payer: Self-pay

## 2022-01-10 ENCOUNTER — Telehealth: Payer: Self-pay | Admitting: Internal Medicine

## 2022-01-10 DIAGNOSIS — J449 Chronic obstructive pulmonary disease, unspecified: Secondary | ICD-10-CM | POA: Insufficient documentation

## 2022-01-10 DIAGNOSIS — K648 Other hemorrhoids: Secondary | ICD-10-CM | POA: Insufficient documentation

## 2022-01-10 DIAGNOSIS — Z1211 Encounter for screening for malignant neoplasm of colon: Secondary | ICD-10-CM | POA: Diagnosis not present

## 2022-01-10 DIAGNOSIS — K644 Residual hemorrhoidal skin tags: Secondary | ICD-10-CM

## 2022-01-10 DIAGNOSIS — F17210 Nicotine dependence, cigarettes, uncomplicated: Secondary | ICD-10-CM | POA: Insufficient documentation

## 2022-01-10 DIAGNOSIS — D123 Benign neoplasm of transverse colon: Secondary | ICD-10-CM | POA: Diagnosis not present

## 2022-01-10 DIAGNOSIS — I2510 Atherosclerotic heart disease of native coronary artery without angina pectoris: Secondary | ICD-10-CM | POA: Insufficient documentation

## 2022-01-10 DIAGNOSIS — K5730 Diverticulosis of large intestine without perforation or abscess without bleeding: Secondary | ICD-10-CM | POA: Diagnosis not present

## 2022-01-10 DIAGNOSIS — I4891 Unspecified atrial fibrillation: Secondary | ICD-10-CM | POA: Insufficient documentation

## 2022-01-10 DIAGNOSIS — Z86010 Personal history of colonic polyps: Secondary | ICD-10-CM | POA: Insufficient documentation

## 2022-01-10 DIAGNOSIS — K635 Polyp of colon: Secondary | ICD-10-CM | POA: Diagnosis not present

## 2022-01-10 DIAGNOSIS — I10 Essential (primary) hypertension: Secondary | ICD-10-CM | POA: Insufficient documentation

## 2022-01-10 DIAGNOSIS — K219 Gastro-esophageal reflux disease without esophagitis: Secondary | ICD-10-CM | POA: Diagnosis not present

## 2022-01-10 HISTORY — PX: COLONOSCOPY WITH PROPOFOL: SHX5780

## 2022-01-10 HISTORY — PX: POLYPECTOMY: SHX5525

## 2022-01-10 HISTORY — PX: BIOPSY: SHX5522

## 2022-01-10 SURGERY — COLONOSCOPY WITH PROPOFOL
Anesthesia: General

## 2022-01-10 MED ORDER — PROPOFOL 10 MG/ML IV BOLUS
INTRAVENOUS | Status: DC | PRN
Start: 2022-01-10 — End: 2022-01-10
  Administered 2022-01-10: 100 mg via INTRAVENOUS
  Administered 2022-01-10: 30 mg via INTRAVENOUS

## 2022-01-10 MED ORDER — PROPOFOL 500 MG/50ML IV EMUL
INTRAVENOUS | Status: DC | PRN
  Administered 2022-01-10: 200 ug/kg/min via INTRAVENOUS

## 2022-01-10 MED ORDER — LACTATED RINGERS IV SOLN
INTRAVENOUS | Status: DC
  Administered 2022-01-10: 1000 mL via INTRAVENOUS

## 2022-01-10 MED ORDER — LIDOCAINE 2% (20 MG/ML) 5 ML SYRINGE
INTRAMUSCULAR | Status: DC | PRN
  Administered 2022-01-10: 50 mg via INTRAVENOUS

## 2022-01-10 NOTE — Progress Notes (Signed)
To Whom it may concern,                                 Garrett Klein medicaid id number [id number] M, was at [facility] today for his procedure appointment.        If you have any questions, call us at [phone]. Thank you.

## 2022-01-10 NOTE — Anesthesia Postprocedure Evaluation (Signed)
Anesthesia Post Note  Patient: Garrett FRANCOIS Sr.  Procedure(s) Performed: COLONOSCOPY WITH PROPOFOL BIOPSY POLYPECTOMY  Patient location during evaluation: Phase II Anesthesia Type: General Level of consciousness: awake and alert and oriented Pain management: pain level controlled Vital Signs Assessment: post-procedure vital signs reviewed and stable Respiratory status: spontaneous breathing, nonlabored ventilation and respiratory function stable Cardiovascular status: blood pressure returned to baseline and stable Postop Assessment: no apparent nausea or vomiting Anesthetic complications: no   No notable events documented.   Last Vitals:  Vitals:   01/10/22 1006 01/10/22 1101  BP: (!) 169/94 (!) 108/55  Pulse:  73  Resp: 15 17  Temp:  36.7 C  SpO2: 97% 94%    Last Pain:  Vitals:   01/10/22 1101  TempSrc: Oral  PainSc: 0-No pain                 Garrett Klein

## 2022-01-10 NOTE — Transfer of Care (Signed)
Immediate Anesthesia Transfer of Care Note  Patient: Garrett COMPEAN Sr.  Procedure(s) Performed: COLONOSCOPY WITH PROPOFOL BIOPSY POLYPECTOMY  Patient Location: Short Stay  Anesthesia Type:MAC  Level of Consciousness: sedated, patient cooperative and responds to stimulation  Airway & Oxygen Therapy: Patient Spontanous Breathing  Post-op Assessment: Report given to RN, Post -op Vital signs reviewed and stable and Patient moving all extremities  Post vital signs: Reviewed and stable  Last Vitals:  Vitals Value Taken Time  BP    Temp    Pulse    Resp    SpO2      Last Pain:  Vitals:   01/10/22 1035  TempSrc:   PainSc: 0-No pain      Patients Stated Pain Goal: 9 (16/96/78 [id number])  Complications: No notable events documented.

## 2022-01-10 NOTE — Telephone Encounter (Signed)
PLEASE CALL PATIENT, DR CARVER SENT HIS PRESCRIPTIONS FROM THIS MORNING TO THE WRONG PHARMACY

## 2022-01-10 NOTE — Discharge Instructions (Addendum)
°  Colonoscopy Discharge Instructions  Read the instructions outlined below and refer to this sheet in the next few weeks. These discharge instructions provide you with general information on caring for yourself after you leave the hospital. Your doctor may also give you specific instructions. While your treatment has been planned according to the most current medical practices available, unavoidable complications occasionally occur.   ACTIVITY You may resume your regular activity, but move at a slower pace for the next 24 hours.  Take frequent rest periods for the next 24 hours.  Walking will help get rid of the air and reduce the bloated feeling in your belly (abdomen).  No driving for 24 hours (because of the medicine (anesthesia) used during the test).   Do not sign any important legal documents or operate any machinery for 24 hours (because of the anesthesia used during the test).  NUTRITION Drink plenty of fluids.  You may resume your normal diet as instructed by your doctor.  Begin with a light meal and progress to your normal diet. Heavy or fried foods are harder to digest and may make you feel sick to your stomach (nauseated).  Avoid alcoholic beverages for 24 hours or as instructed.  MEDICATIONS You may resume your normal medications unless your doctor tells you otherwise.  WHAT YOU CAN EXPECT TODAY Some feelings of bloating in the abdomen.  Passage of more gas than usual.  Spotting of blood in your stool or on the toilet paper.  IF YOU HAD POLYPS REMOVED DURING THE COLONOSCOPY: No aspirin products for 7 days or as instructed.  No alcohol for 7 days or as instructed.  Eat a soft diet for the next 24 hours.  FINDING OUT THE RESULTS OF YOUR TEST Not all test results are available during your visit. If your test results are not back during the visit, make an appointment with your caregiver to find out the results. Do not assume everything is normal if you have not heard from your  caregiver or the medical facility. It is important for you to follow up on all of your test results.  SEEK IMMEDIATE MEDICAL ATTENTION IF: You have more than a spotting of blood in your stool.  Your belly is swollen (abdominal distention).  You are nauseated or vomiting.  You have a temperature over 101.  You have abdominal pain or discomfort that is severe or gets worse throughout the day.   Your colonoscopy revealed 1 polyp(s) which I removed successfully. Await pathology results, my office will contact you. I recommend repeating colonoscopy in 5 years for surveillance purposes.   You also have diverticulosis and internal hemorrhoids. I would recommend increasing fiber in your diet or adding OTC Benefiber/Metamucil. Be sure to drink at least 4 to 6 glasses of water daily.   I also took biopsies of your colon. Continue on Budesonide.   I did not see any evidence of anal warts today  I hope you have a great rest of your week!  Elon Alas. Abbey Chatters, D.O. Gastroenterology and Hepatology [facility]

## 2022-01-10 NOTE — Telephone Encounter (Signed)
Pt called stating that oxycodone was on his discharge papers as being sent in to [facility]. I don't see anything in his chart stating that oxycodone had been called in or that is was going to be called in. Please advise.

## 2022-01-10 NOTE — Anesthesia Preprocedure Evaluation (Addendum)
Anesthesia Evaluation  Patient identified by MRN, date of birth, ID band Patient awake    Reviewed: Allergy & Precautions, NPO status , Patient's Chart, lab work & pertinent test results  Airway Mallampati: II  TM Distance: >3 FB Neck ROM: Full    Dental  (+) Edentulous Upper, Edentulous Lower   Pulmonary pneumonia, COPD, Current Smoker and Patient abstained from smoking.,    Pulmonary exam normal breath sounds clear to auscultation       Cardiovascular Exercise Tolerance: Good hypertension, Pt. on medications + CAD  Normal cardiovascular exam+ dysrhythmias Atrial Fibrillation  Rhythm:Regular Rate:Normal     Neuro/Psych PSYCHIATRIC DISORDERS Anxiety Depression    GI/Hepatic Neg liver ROS, PUD, GERD  Medicated and Controlled,  Endo/Other  negative endocrine ROS  Renal/GU negative Renal ROS     Musculoskeletal  (+) Arthritis , Osteoarthritis,    Abdominal   Peds  Hematology negative hematology ROS (+)   Anesthesia Other Findings   Reproductive/Obstetrics                            Anesthesia Physical Anesthesia Plan  ASA: 2  Anesthesia Plan: General   Post-op Pain Management: Minimal or no pain anticipated   Induction: Intravenous  PONV Risk Score and Plan: TIVA  Airway Management Planned: Natural Airway and Nasal Cannula  Additional Equipment:   Intra-op Plan:   Post-operative Plan:   Informed Consent: I have reviewed the patients History and Physical, chart, labs and discussed the procedure including the risks, benefits and alternatives for the proposed anesthesia with the patient or authorized representative who has indicated his/her understanding and acceptance.     Dental advisory given  Plan Discussed with: CRNA and Surgeon  Anesthesia Plan Comments:         Anesthesia Quick Evaluation

## 2022-01-10 NOTE — H&P (Signed)
Primary Care Physician:  [name], MD Primary Gastroenterologist:  Dr. Abbey Chatters  Pre-Procedure History & Physical: HPI:  Garrett KAUFFMANN Sr. is a 66 y.o. male is here for a colonoscopy to be performed for surveillance purposes, personal history of colon polyps.  Past Medical History:  Diagnosis Date   Atrial flutter ([place])    Chronic lower back pain    Collagenous colitis 2014   [facility]   COPD (chronic obstructive pulmonary disease) ([place])    Coronary artery calcification seen on CT scan    Essential hypertension    History of atrial flutter    Hyperlipidemia    MRSA infection    PUD (peptic ulcer disease)     Past Surgical History:  Procedure Laterality Date   BACK SURGERY     BIOPSY  06/03/2017   Procedure: BIOPSY;  Surgeon: Danie Binder, MD;  [facility];  Service: Endoscopy;;  colon   COLONOSCOPY  2001   Dr. Sharlett Iles, normal   COLONOSCOPY  04/2013   [facility]: Fentanyl 200mg Danford Bad 10mg : sigmoid diverticulosis, normal terminal ileum, small internal hemorrhoids, random colon bx: collagenous colitis. next tcs 04/2018   COLONOSCOPY  2011   [facility]: two polyps, sessile serrated adenoma, mild diverticulosis   COLONOSCOPY WITH PROPOFOL N/A 06/03/2017   Dr. Oneida Alar: Diverticulosis, three 2 to 3 mm polyps removed from the mid transverse colon and cecum, three 4 to 6 mm polyps in the rectum and mid descending colon removed.  External/internal hemorrhoids.  hyperplastic polyps.  Random colon biopsies negative for microscopic colitis. next colonoscopy in 5 years.   ELBOW BURSA SURGERY Right    ESOPHAGEAL DILATION N/A 05/19/2015   Procedure: ESOPHAGEAL DILATION;  Surgeon: Danie Binder, MD;  [facility];  Service: Endoscopy;  Laterality: N/A;   ESOPHAGOGASTRODUODENOSCOPY N/A 10/28/2014   [facility]: 1. Esophagitis due to GERD. KOH neg. 2. Small hiatal hernia 3. Moderate erosive gastritis 4. RUQ pain due to large ulcer 5. Jarratt duodentis in the bulb.     ESOPHAGOGASTRODUODENOSCOPY N/A 05/19/2015   [facility]: 1. Mild distal esophagitis 2. Peptic stricture at the gastroesophageal junction 3. Mild non-erosive gastritis 4. Pseudo pylorus due to prior PUD.    ESOPHAGOGASTRODUODENOSCOPY N/A 03/07/2017   Dr. Oneida Alar: Benign-appearing esophageal stenosis status post dilation, mild gastritis   FINGER AMPUTATION Left 1985   index finger   HYDROCELE EXCISION Right 10/02/2018   Procedure: HYDROCELECTOMY ADULT;  Surgeon: Irine Seal, MD;  Location: [facility];  Service: Urology;  Laterality: Right;   JOINT REPLACEMENT     left hip surgery - January 26, 2014   POLYPECTOMY  06/03/2017   Procedure: POLYPECTOMY;  Surgeon: Danie Binder, MD;  [facility];  Service: Endoscopy;;  colon   ROTATOR CUFF REPAIR Right    November 2014   SAVORY DILATION N/A 03/07/2017   Procedure: SAVORY DILATION;  Surgeon: Danie Binder, MD;  [facility];  Service: Endoscopy;  Laterality: N/A;    Prior to Admission medications   Medication Sig Start Date End Date Taking? Authorizing Provider  albuterol (PROVENTIL HFA;VENTOLIN HFA) 108 (90 BASE) MCG/ACT inhaler Inhale 2 puffs into the lungs every 6 (six) hours as needed for shortness of breath or wheezing.   Yes [provider]  apixaban (ELIQUIS) 5 MG TABS tablet Take 1 tablet (5 mg total) by mouth 2 (two) times daily. 03/06/21  Yes Satira Sark, MD  atorvastatin (LIPITOR) 40 MG tablet Take 40 mg by mouth in  the morning. 05/27/19  Yes [provider]  budesonide (ENTOCORT EC) 3 MG 24 hr capsule Take 3 capsules (9 mg total) by mouth daily for 30 days, THEN 2 capsules (6 mg total) daily for 14 days, THEN 1 capsule (3 mg total) daily for 14 days. 12/11/21 02/07/22 Yes Annitta Needs, NP  cholecalciferol (VITAMIN D3) 25 MCG (1000 UNIT) tablet Take 4,000 Units by mouth in the morning.   Yes [provider]  Coenzyme Q10 (CO Q 10) 100 MG CAPS Take 100 mg by mouth in the morning.   Yes [provider]  diltiazem (CARDIZEM CD) 240 MG 24 hr capsule TAKE 1 CAPSULE BY MOUTH  DAILY 08/20/21  Yes Satira Sark, MD  gabapentin (NEURONTIN) 300 MG capsule Take 300 mg by mouth 3 (three) times daily. 11/30/21  Yes [provider]  Glucosamine-Chondroitin 750-600 MG TABS Take 1 tablet by mouth in the morning.   Yes [provider]  Multiple Vitamin (MULTIVITAMIN WITH MINERALS) TABS tablet Take 1 tablet by mouth daily. Centrum Multivitamin   Yes [provider]  omeprazole (PRILOSEC) 20 MG capsule Take 1 capsule (20 mg total) by mouth daily. 30 minutes before breakfast 11/28/21  Yes Annitta Needs, NP  polyethylene glycol-electrolytes (NULYTELY) 420 g solution As directed 12/11/21  Yes Hurshel Keys K, DO  EPINEPHrine 0.3 mg/0.3 mL IJ SOAJ injection Inject 0.3 mg into the muscle as needed (allergic reaction).     [provider]  sildenafil (VIAGRA) 100 MG tablet Take 100 mg by mouth daily as needed for erectile dysfunction.    [provider]    Allergies as of 12/11/2021 - Review Complete 11/28/2021  Allergen Reaction Noted   Bee venom Swelling 06/21/2012   Ciprofloxacin Other (See Comments) 08/06/2016   Levaquin [levofloxacin in d5w] Other (See Comments) 12/11/2016   Tramadol Hives 10/18/2014   Hydrocodone Rash 03/28/2013   Sulfa antibiotics Rash 03/28/2013    Family History  Problem Relation Age of Onset   Ovarian cancer Mother    Diabetes type II Father    Heart attack Father    Arthritis Sister    Stroke Brother    Coronary artery disease Brother    Diabetes Other    Heart attack Other    Colon cancer Neg Hx    Liver disease Neg Hx    Ulcers Neg Hx     Social History   Socioeconomic History   Marital status: Widowed    Spouse name: Not on file   Number of children: Not on file   Years of education: Not on file   Highest education level: Not on file  Occupational History   Not on file  Tobacco Use   Smoking  status: Every Day    Packs/day: 0.50    Years: 40.00    Pack years: 20.00    Types: Cigarettes    Start date: 01/09/1969   Smokeless tobacco: Never  Vaping Use   Vaping Use: Never used  Substance and Sexual Activity   Alcohol use: No    Alcohol/week: 0.0 standard drinks   Drug use: No   Sexual activity: Not on file  Other Topics Concern   Not on file  Social History Narrative   Not on file   Social Determinants of Health   Financial Resource Strain: Not on file  Food Insecurity: Not on file  Transportation Needs: Not on file  Physical Activity: Not on file  Stress: Not on file  Social Connections: Not on file  Intimate Partner Violence: Not on file    Review of Systems: See HPI, otherwise negative ROS  Physical Exam: Vital signs in last 24 hours: Resp:  [15] 15 (02/23 1006) BP: (169)/(94) 169/94 (02/23 1006) SpO2:  [97 %] 97 % (02/23 1006)   General:   Alert,  Well-developed, well-nourished, pleasant and cooperative in NAD Head:  Normocephalic and atraumatic. Eyes:  Sclera clear, no icterus.   Conjunctiva pink. Ears:  Normal auditory acuity. Nose:  No deformity, discharge,  or lesions. Mouth:  No deformity or lesions, dentition normal. Neck:  Supple; no masses or thyromegaly. Lungs:  Clear throughout to auscultation.   No wheezes, crackles, or rhonchi. No acute distress. Heart:  Regular rate and rhythm; no murmurs, clicks, rubs,  or gallops. Abdomen:  Soft, nontender and nondistended. No masses, hepatosplenomegaly or hernias noted. Normal bowel sounds, without guarding, and without rebound.   Msk:  Symmetrical without gross deformities. Normal posture. Extremities:  Without clubbing or edema. Neurologic:  Alert and  oriented x4;  grossly normal neurologically. Skin:  Intact without significant lesions or rashes. Cervical Nodes:  No significant cervical adenopathy. Psych:  Alert and cooperative. Normal mood and affect.  Impression/Plan: Garrett Salinas Sr. is  here for a colonoscopy to be performed for surveillance purposes, personal history of colon polyps.  The risks of the procedure including infection, bleed, or perforation as well as benefits, limitations, alternatives and imponderables have been reviewed with the patient. Questions have been answered. All parties agreeable.

## 2022-01-10 NOTE — Op Note (Signed)
[facility] Patient Name: Garrett Klein Procedure Date: 01/10/2022 10:26 AM MRN: [id number] Date of Birth: 1955/11/26 Attending MD: Elon Alas. Edgar Frisk CSN: [id number] Age: 66 Admit Type: Outpatient Procedure:                Colonoscopy Indications:              High risk colon cancer surveillance: Personal                            history of colonic polyps Providers:                Elon Alas. Abbey Chatters, DO, Hughie Closs RN, RN, Tammy                            Vaught, RN, Suzan Garibaldi. Risa Grill, Technician Referring MD:              Medicines:                See the Anesthesia note for documentation of the                            administered medications Complications:            No immediate complications. Estimated Blood Loss:     Estimated blood loss was minimal. Procedure:                Pre-Anesthesia Assessment:                           - The anesthesia plan was to use monitored                            anesthesia care (MAC).                           After obtaining informed consent, the colonoscope                            was passed under direct vision. Throughout the                            procedure, the patient's blood pressure, pulse, and                            oxygen saturations were monitored continuously. The                            PCF-HQ190L ([id number]) scope was introduced through                            the anus and advanced to the the cecum, identified                            by appendiceal orifice and ileocecal valve. The                            colonoscopy  was performed without difficulty. The                            patient tolerated the procedure well. The quality                            of the bowel preparation was evaluated using the                            BBPS [facility] Bowel Preparation Scale) with scores                            of: Right Colon = 3, Transverse Colon = 3 and Left                            Colon =  3 (entire mucosa seen well with no residual                            staining, small fragments of stool or opaque                            liquid). The total BBPS score equals 9. Scope In: 10:41:52 AM Scope Out: 10:51:53 AM Scope Withdrawal Time: 0 hours 8 minutes 1 second  Total Procedure Duration: 0 hours 10 minutes 1 second  Findings:      Non-bleeding internal hemorrhoids were found during endoscopy.      Skin tags were found on perianal exam.      Multiple small and large-mouthed diverticula were found in the sigmoid       colon.      A 4 mm polyp was found in the transverse colon. The polyp was sessile.       The polyp was removed with a cold snare. Resection and retrieval were       complete.      Biopsies for histology were taken with a cold forceps from the ascending       colon, transverse colon and descending colon for evaluation of       microscopic colitis. Impression:               - Non-bleeding internal hemorrhoids.                           - Perianal skin tags found on perianal exam.                           - Diverticulosis in the sigmoid colon.                           - One 4 mm polyp in the transverse colon, removed                            with a cold snare. Resected and retrieved.                           - Biopsies were taken with a cold forceps  from the                            ascending colon, transverse colon and descending                            colon for evaluation of microscopic colitis. Moderate Sedation:      Per Anesthesia Care Recommendation:           - Patient has a contact number available for                            emergencies. The signs and symptoms of potential                            delayed complications were discussed with the                            patient. Return to normal activities tomorrow.                            Written discharge instructions were provided to the                            patient.                            - Resume previous diet.                           - Continue present medications.                           - Await pathology results.                           - Repeat colonoscopy in 5 years for surveillance.                           - Return to GI clinic in 4 months. Procedure Code(s):        --- Professional ---                           [phone], Colonoscopy, flexible; with removal of                            tumor(s), polyp(s), or other lesion(s) by snare                            technique                           92119, 64, Colonoscopy, flexible; with biopsy,                            single or multiple Diagnosis Code(s):        --- Professional ---  Z86.010, Personal history of colonic polyps                           K64.8, Other hemorrhoids                           K63.5, Polyp of colon                           K64.4, Residual hemorrhoidal skin tags                           K57.30, Diverticulosis of large intestine without                            perforation or abscess without bleeding CPT copyright 2019 American Medical Association. All rights reserved. The codes documented in this report are preliminary and upon coder review may  be revised to meet current compliance requirements. Elon Alas. Abbey Chatters, DO [place] Abbey Chatters, DO 01/10/2022 10:54:36 AM This report has been signed electronically. Number of Addenda: 0

## 2022-01-11 LAB — SURGICAL PATHOLOGY

## 2022-01-15 ENCOUNTER — Encounter (HOSPITAL_COMMUNITY): Payer: Self-pay | Admitting: Internal Medicine

## 2022-01-15 NOTE — Telephone Encounter (Signed)
Pt was made aware and verbalized understanding.  

## 2022-01-15 NOTE — Telephone Encounter (Signed)
If his DC papers said OXYCODONE then this medication was on there prior to [facility]. He needs to reach out to whomever originally prescribed this medication to him. I do not prescribe chronic opioids. Thank you

## 2022-02-20 ENCOUNTER — Other Ambulatory Visit: Payer: Self-pay | Admitting: Gastroenterology

## 2022-02-20 NOTE — Telephone Encounter (Signed)
Pt is needing a refill on Budesonide 3 mg tab. Last filled on 12/11/2021. The pt's last office visit was 11/28/2021. ?

## 2022-02-21 NOTE — Telephone Encounter (Signed)
I phoned and spoke with the pt and advised of your message and he states "well yea" but I had to have something. He asks can a get an appt with you sooner than 6/27th because he's no better. I advised the pt he should have called [place] during the time if his med's wasn't doing him any good. The pt advises he has tried Imodium and several other things and it is still the same but some of his stools had some form to it. Pt is aware you are gone for the holiday and it will be Monday or Tuesday before I speak with him. He expressed understanding of this ?

## 2022-02-21 NOTE — Telephone Encounter (Signed)
Refill not appropriate. He was to taper off. Thanks! ?

## 2022-02-25 NOTE — Telephone Encounter (Signed)
Please arrange sooner visit with me, in next few weeks. Can be virtual.  ?

## 2022-03-05 ENCOUNTER — Telehealth: Payer: Self-pay | Admitting: *Deleted

## 2022-03-05 ENCOUNTER — Telehealth (INDEPENDENT_AMBULATORY_CARE_PROVIDER_SITE_OTHER): Payer: Medicare Other | Admitting: Gastroenterology

## 2022-03-05 VITALS — Ht 70.0 in | Wt 190.0 lb

## 2022-03-05 DIAGNOSIS — K52831 Collagenous colitis: Secondary | ICD-10-CM | POA: Diagnosis not present

## 2022-03-05 MED ORDER — LOPERAMIDE HCL 2 MG PO TABS: 2.0000 mg | ORAL_TABLET | Freq: Four times a day (QID) | ORAL | 3 refills | Status: DC | PRN

## 2022-03-05 MED ORDER — BUDESONIDE 3 MG PO CPEP: ORAL_CAPSULE | ORAL | 0 refills | Status: AC

## 2022-03-05 NOTE — Telephone Encounter (Signed)
Garrett Salinas Sr., you are scheduled for a virtual visit with your provider today.  Just as we do with appointments in the office, we must obtain your consent to participate.  Your consent will be active for this visit and any virtual visit you may have with one of our providers in the next 365 days.  If you have a MyChart account, I can also send a copy of this consent to you electronically.  All virtual visits are billed to your insurance company just like a traditional visit in the office.  As this is a virtual visit, video technology does not allow for your provider to perform a traditional examination.  This may limit your provider's ability to fully assess your condition.  If your provider identifies any concerns that need to be evaluated in person or the need to arrange testing such as labs, EKG, etc, we will make arrangements to do so.  Although advances in technology are sophisticated, we cannot ensure that it will always work on either your end or our end.  If the connection with a video visit is poor, we may have to switch to a telephone visit.  With either a video or telephone visit, we are not always able to ensure that we have a secure connection.   I need to obtain your verbal consent now.   Are you willing to proceed with your visit today?  ?

## 2022-03-05 NOTE — Progress Notes (Signed)
? ? ? ? ? ?Primary Care Physician:  [name], MD  ?Primary GI: Dr. Abbey Chatters ? ?Patient Location: Home ?  ?Provider Location: [place] office ?  ?Reason for Visit: Follow-up ?  ?Persons present on the virtual encounter, with roles: Patient and NP ?  ?Total time (minutes) spent on medical discussion: 10 minutes ?  ?Due to COVID-19, visit was conducted using virtual method.  Visit was requested by patient. ? ?Virtual Visit via Telephone Note ?Due to COVID-19, visit is conducted virtually and was requested by patient.  ? ?I connected with Garrett CALVIN Sr. on 03/19/22 at  4:00 PM EDT by telephone and verified that I am speaking with the correct person using two identifiers. ?  ?I discussed the limitations, risks, security and privacy concerns of performing an evaluation and management service by telephone and the availability of in person appointments. I also discussed with the patient that there may be a patient responsible charge related to this service. The patient expressed understanding and agreed to proceed. ? ?Chief Complaint  ?Patient presents with  ? Follow-up  ?  Pt is having a lot of diarrhea. Pt has 15 or 20 bouts of diarrhea a day. (Mainly liquid- green looking).  ? ? ? ?History of Present Illness: ? ?Presenting today in follow-up after colonoscopy. History of collagenous colitis diagnosed in 2014. He has a remote history of polyps many years ago. ?Colonoscopy Feb 2023: non-bleeding internal hemorrhoids, sigmoid diverticulosis, one 4 mm polyp in transverse colon, multiple colonic biopsies. Path consistent with collagenous colitis. Tubular adenoma.  ? ?He had been started empirically on budesonide. Stools never improved significantly on budesonide. Improved from liquidy to loose. 15-20 loose stools per day prior to starting budesonide.  +smoking  ? ? ?Past Medical History:  ?Diagnosis Date  ? Atrial flutter ([place])   ? Chronic lower back pain   ? Collagenous colitis 2014  ? [place]  ? COPD (chronic obstructive  pulmonary disease) ([place])   ? Coronary artery calcification seen on CT scan   ? Essential hypertension   ? History of atrial flutter   ? Hyperlipidemia   ? MRSA infection   ? PUD (peptic ulcer disease)   ? ? ? ?Past Surgical History:  ?Procedure Laterality Date  ? BACK SURGERY    ? BIOPSY  06/03/2017  ? Procedure: BIOPSY;  Surgeon: Danie Binder, MD;  [facility];  Service: Endoscopy;;  colon  ? BIOPSY  01/10/2022  ? Procedure: BIOPSY;  Surgeon: Eloise Harman, DO;  [facility];  Service: Endoscopy;;  ? COLONOSCOPY  2001  ? Dr. Sharlett Iles, normal  ? COLONOSCOPY  04/2013  ? [place]: Fentanyl '200mg'$ /versed '10mg'$ : sigmoid diverticulosis, normal terminal ileum, small internal hemorrhoids, random colon bx: collagenous colitis. next tcs 04/2018  ? COLONOSCOPY  2011  ? [place]: two polyps, sessile serrated adenoma, mild diverticulosis  ? COLONOSCOPY WITH PROPOFOL N/A 06/03/2017  ? Dr. Oneida Alar: Diverticulosis, three 2 to 3 mm polyps removed from the mid transverse colon and cecum, three 4 to 6 mm polyps in the rectum and mid descending colon removed.  External/internal hemorrhoids.  hyperplastic polyps.  Random colon biopsies negative for microscopic colitis. next colonoscopy in 5 years.  ? COLONOSCOPY WITH PROPOFOL N/A 01/10/2022  ? Procedure: COLONOSCOPY WITH PROPOFOL;  Surgeon: Eloise Harman, DO;  [facility];  Service: Endoscopy;  Laterality: N/A;  11:15am  ? ELBOW BURSA SURGERY Right   ? ESOPHAGEAL DILATION N/A 05/19/2015  ?  Procedure: ESOPHAGEAL DILATION;  Surgeon: Danie Binder, MD;  [facility];  Service: Endoscopy;  Laterality: N/A;  ? ESOPHAGOGASTRODUODENOSCOPY N/A 10/28/2014  ? SLF: 1. Esophagitis due to GERD. KOH neg. 2. Small hiatal hernia 3. Moderate erosive gastritis 4. RUQ pain due to large ulcer 5. [place] duodentis in the bulb.   ? ESOPHAGOGASTRODUODENOSCOPY N/A 05/19/2015  ? SLF: 1. Mild distal esophagitis 2. Peptic stricture at the gastroesophageal  junction 3. Mild non-erosive gastritis 4. Pseudo pylorus due to prior PUD.   ? ESOPHAGOGASTRODUODENOSCOPY N/A 03/07/2017  ? Dr. Oneida Alar: Benign-appearing esophageal stenosis status post dilation, mild gastritis  ? FINGER AMPUTATION Left 1985  ? index finger  ? HYDROCELE EXCISION Right 10/02/2018  ? Procedure: HYDROCELECTOMY ADULT;  Surgeon: Irine Seal, MD;  Location: [facility];  Service: Urology;  Laterality: Right;  ? JOINT REPLACEMENT    ? left hip surgery - January 26, 2014  ? POLYPECTOMY  06/03/2017  ? Procedure: POLYPECTOMY;  Surgeon: Danie Binder, MD;  [facility];  Service: Endoscopy;;  colon  ? POLYPECTOMY  01/10/2022  ? Procedure: POLYPECTOMY;  Surgeon: Eloise Harman, DO;  [facility];  Service: Endoscopy;;  ? ROTATOR CUFF REPAIR Right   ? November 2014  ? SAVORY DILATION N/A 03/07/2017  ? Procedure: SAVORY DILATION;  Surgeon: Danie Binder, MD;  [facility];  Service: Endoscopy;  Laterality: N/A;  ? ? ? ?Current Meds  ?Medication Sig  ? atorvastatin (LIPITOR) 40 MG tablet Take 40 mg by mouth in the morning.  ? budesonide (ENTOCORT EC) 3 MG 24 hr capsule Take 3 capsules (9 mg total) by mouth daily for 60 days, THEN 2 capsules (6 mg total) daily for 14 days, THEN 1 capsule (3 mg total) daily for 14 days.  ? cholecalciferol (VITAMIN D3) 25 MCG (1000 UNIT) tablet Take 4,000 Units by mouth in the morning.  ? Coenzyme Q10 (CO Q 10) 100 MG CAPS Take 100 mg by mouth in the morning.  ? diltiazem (CARDIZEM CD) 240 MG 24 hr capsule TAKE 1 CAPSULE BY MOUTH  DAILY  ? EPINEPHrine 0.3 mg/0.3 mL IJ SOAJ injection Inject 0.3 mg into the muscle as needed (allergic reaction).   ? gabapentin (NEURONTIN) 300 MG capsule Take 300 mg by mouth 3 (three) times daily.  ? Glucosamine-Chondroitin 750-600 MG TABS Take 1 tablet by mouth in the morning.  ? loperamide (IMODIUM A-D) 2 MG tablet Take 1 tablet (2 mg total) by mouth 4 (four) times daily as needed for diarrhea or loose stools.  ? Multiple  Vitamin (MULTIVITAMIN WITH MINERALS) TABS tablet Take 1 tablet by mouth daily. Centrum Multivitamin  ? omeprazole (PRILOSEC) 20 MG capsule Take 1 capsule (20 mg total) by mouth daily. 30 minutes before breakfast  ? [DISCONTINUED] apixaban (ELIQUIS) 5 MG TABS tablet Take 1 tablet (5 mg total) by mouth 2 (two) times daily.  ?  ? ?Family History  ?Problem Relation Age of Onset  ? Ovarian cancer Mother   ? Diabetes type II Father   ? Heart attack Father   ? Arthritis Sister   ? Stroke Brother   ? Coronary artery disease Brother   ? Diabetes Other   ? Heart attack Other   ? Colon cancer Neg Hx   ? Liver disease Neg Hx   ? Ulcers Neg Hx   ? ? ?Social History  ? ?Socioeconomic History  ? Marital status: Widowed  ?  Spouse name: Not on file  ?  Number of children: Not on file  ? Years of education: Not on file  ? Highest education level: Not on file  ?Occupational History  ? Not on file  ?Tobacco Use  ? Smoking status: Every Day  ?  Packs/day: 0.50  ?  Years: 40.00  ?  Pack years: 20.00  ?  Types: Cigarettes  ?  Start date: 01/09/1969  ? Smokeless tobacco: Never  ?Vaping Use  ? Vaping Use: Never used  ?Substance and Sexual Activity  ? Alcohol use: No  ?  Alcohol/week: 0.0 standard drinks  ? Drug use: No  ? Sexual activity: Not on file  ?Other Topics Concern  ? Not on file  ?Social History Narrative  ? Not on file  ? ?Social Determinants of Health  ? ?Financial Resource Strain: Not on file  ?Food Insecurity: Not on file  ?Transportation Needs: Not on file  ?Physical Activity: Not on file  ?Stress: Not on file  ?Social Connections: Not on file  ? ? ? ? ? Review of Systems: ?Gen: Denies fever, chills, anorexia. Denies fatigue, weakness, weight loss.  ?CV: Denies chest pain, palpitations, syncope, peripheral edema, and claudication. ?Resp: Denies dyspnea at rest, cough, wheezing, coughing up blood, and pleurisy. ?GI: see HPI ?Derm: Denies rash, itching, dry skin ?Psych: Denies depression, anxiety, memory loss, confusion. No  homicidal or suicidal ideation.  ?Heme: Denies bruising, bleeding, and enlarged lymph nodes. ? ?Observations/Objective: ?No distress. Unable to perform physical exam due to telephone encounter.  ? ?Assessment a

## 2022-03-05 NOTE — Telephone Encounter (Signed)
Pt consented to a virtual visit. 

## 2022-03-05 NOTE — Patient Instructions (Addendum)
It's important to stop smoking.  ? ?I have sent in budesonide 9 milligrams to take for 60 days, then 6 milligrams for 14 days, then 3 milligrams for 14 days.  ? ?You can take imodium as needed, no more than 4 times per day. Stop if constipation. ? ?We will see you in 3 months! ? ?Annitta Needs, PhD, ANP-BC ?[facility]  ? ?

## 2022-03-07 IMAGING — CT CT ABD-PELV W/ CM
2 of 5 series · 16 of 46 positions shown, 18 images · IV contrast (Omnipaque or Isovue)
Comparison: CT abdomen and pelvis 06/16/2017

CLINICAL DATA: Abdominal pain

EXAM:
CT ABDOMEN AND PELVIS WITH CONTRAST
TECHNIQUE: Multidetector CT imaging of the abdomen and pelvis was performed
using the standard protocol following bolus administration of
intravenous contrast.

[Series 2: axial st · axial · 0.80mm/px · z∈[+774,+1229]mm · 13 of 103 slices shown, 15 images]
[im 6/103  soft-tissue]
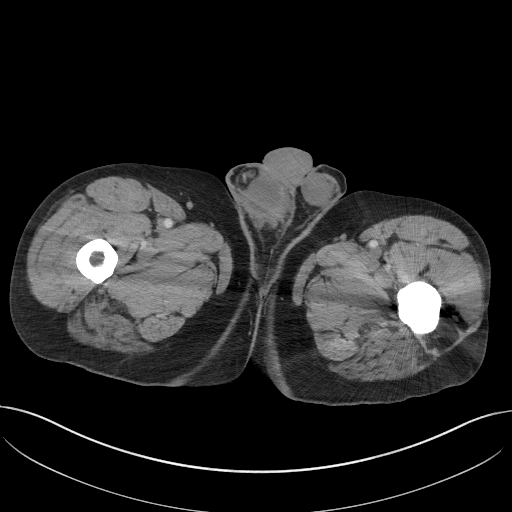
[im 6/103  bone]
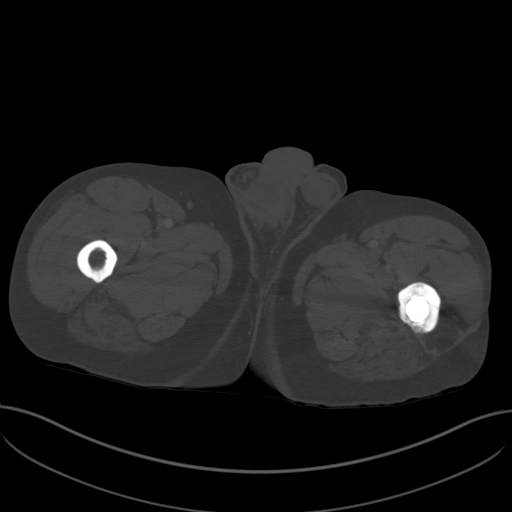
[im 16/103  soft-tissue]
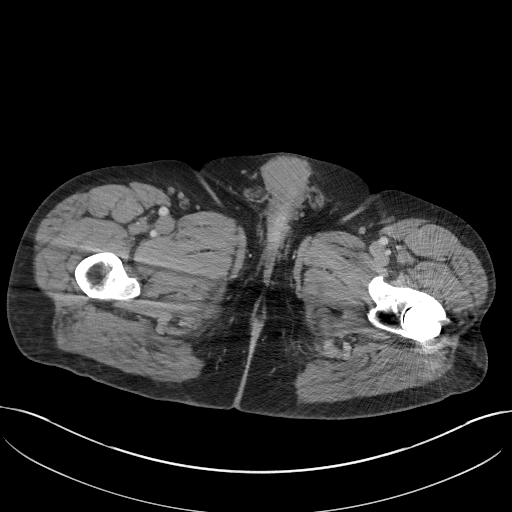
[im 21/103  soft-tissue]
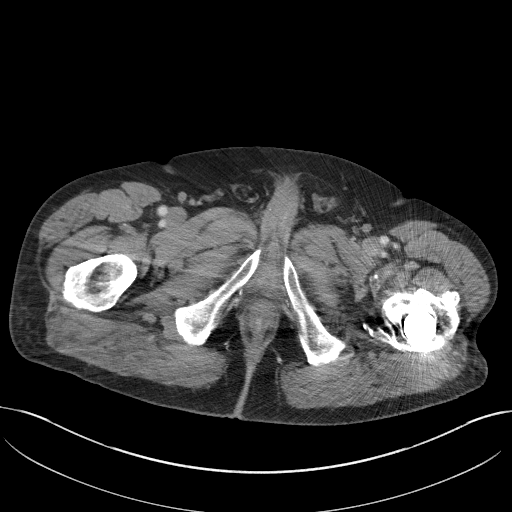
[im 31/103  soft-tissue]
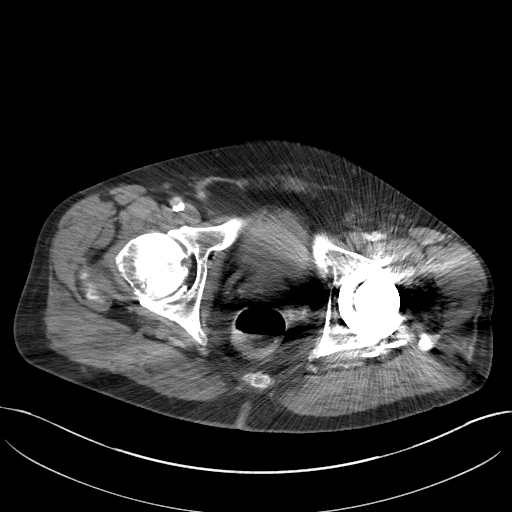
[im 36/103  soft-tissue]
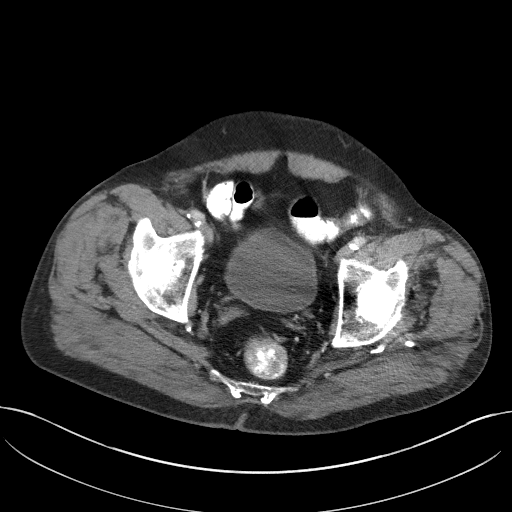
[im 46/103  soft-tissue]
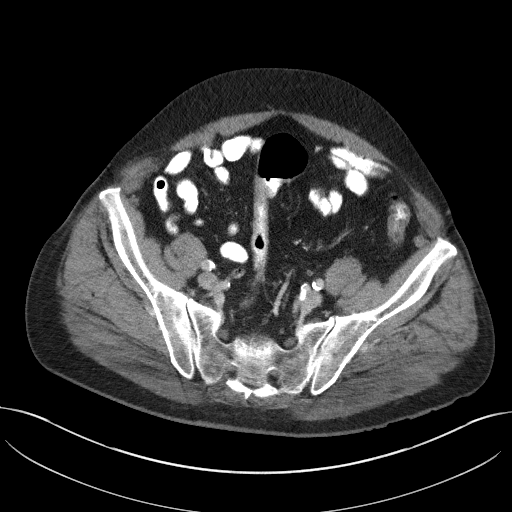
[im 52/103  soft-tissue]
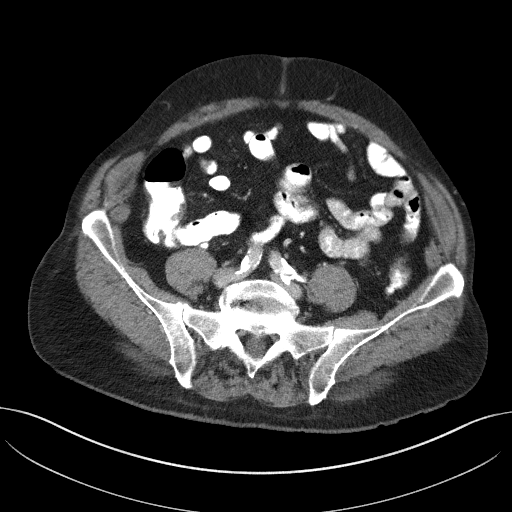
[im 57/103  soft-tissue]
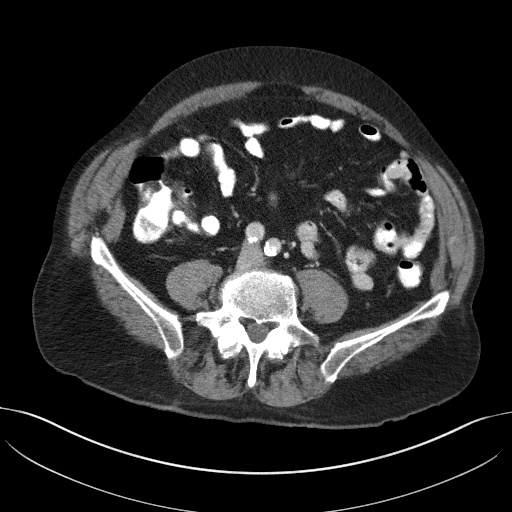
[im 67/103  soft-tissue]
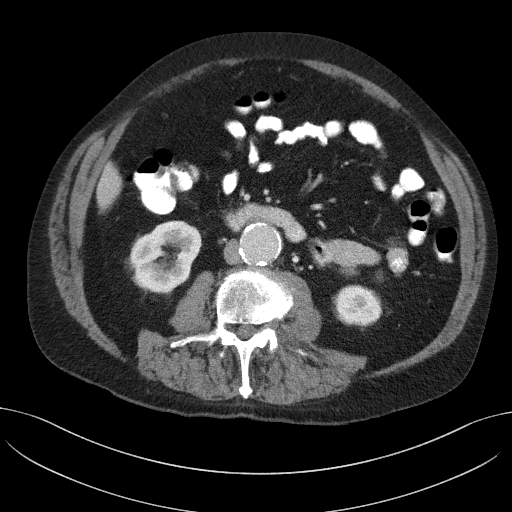
[im 67/103  bone]
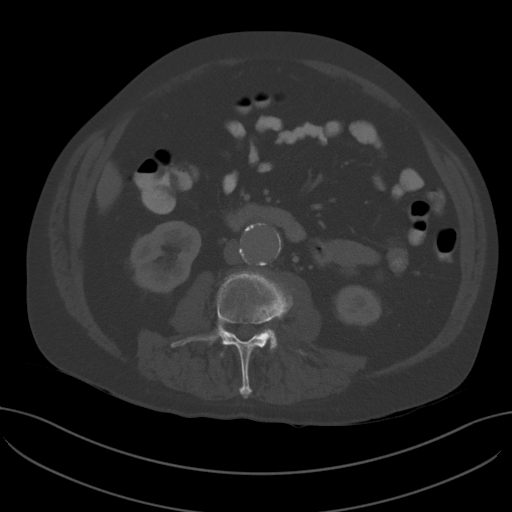
[im 72/103  soft-tissue]
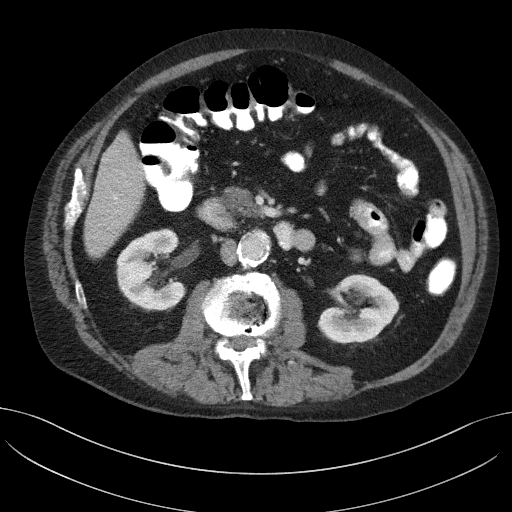
[im 82/103  soft-tissue]
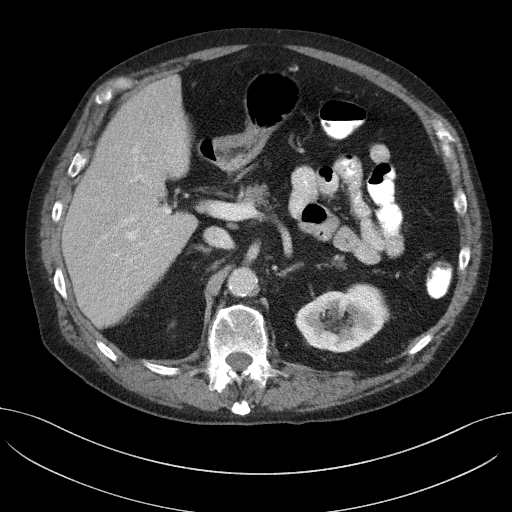
[im 87/103  soft-tissue]
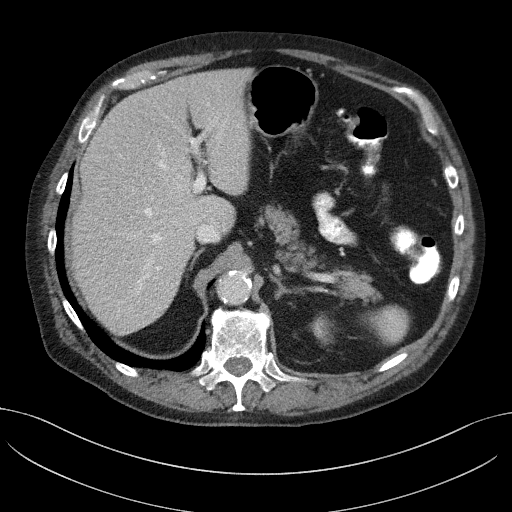
[im 97/103  soft-tissue]
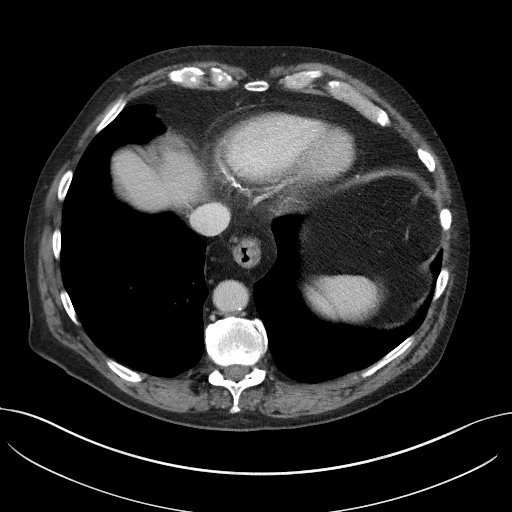

[Series 5: coronal st · coronal · 0.83mm/px · 3 of 116 slices shown]
[im 39/116  soft-tissue]
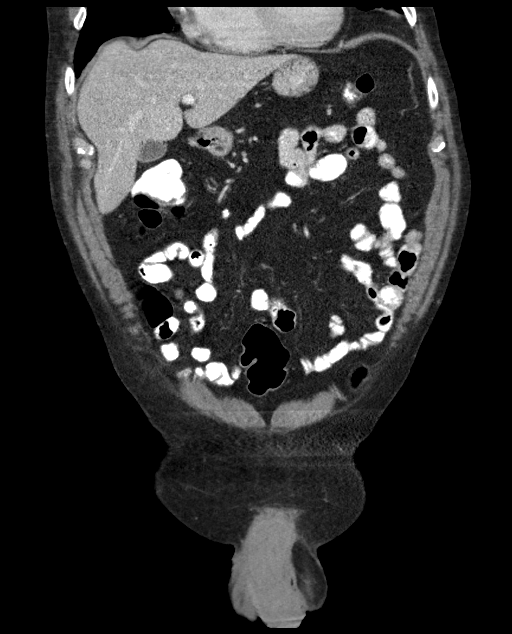
[im 52/116  soft-tissue]
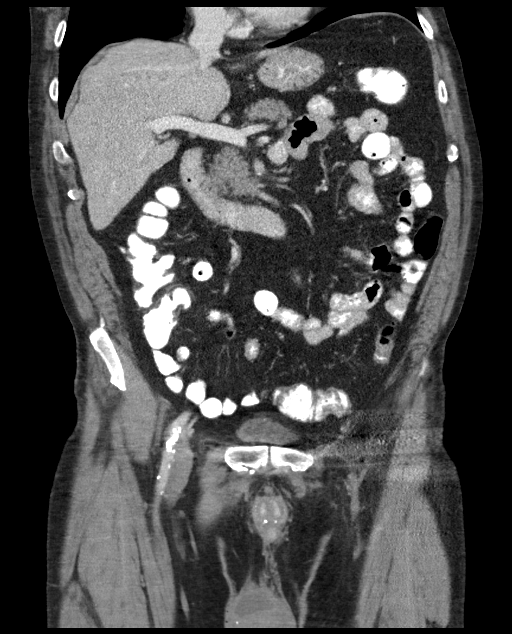
[im 64/116  soft-tissue]
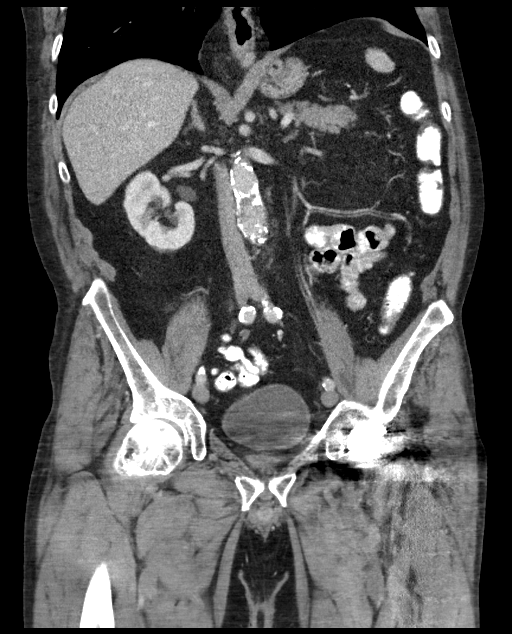

[16 of 46 positions shown; findings below may reference images not displayed]

RADIATION DOSE REDUCTION: This exam was performed according to the
departmental dose-optimization program which includes automated
exposure control, adjustment of the mA and/or kV according to
patient size and/or use of iterative reconstruction technique.

CONTRAST:  100mL OMNIPAQUE IOHEXOL 300 MG/ML  SOLN
FINDINGS: Lower chest: No acute abnormality.

Hepatobiliary: No focal liver abnormality is seen. No gallstones,
gallbladder wall thickening, or biliary dilatation.

Pancreas: Unremarkable. No pancreatic ductal dilatation or
surrounding inflammatory changes.

Spleen: Normal in size without focal abnormality.

Adrenals/Urinary Tract: Adrenal glands are unremarkable. Kidneys are
normal, without renal calculi, focal lesion, or hydronephrosis.
Bladder is unremarkable.

Stomach/Bowel: Tiny hiatal hernia. No bowel obstruction, free air or
pneumatosis. Colonic diverticulosis. No bowel wall edema. Appendix
is normal.

Vascular/Lymphatic: Fusiform infrarenal abdominal aortic aneurysm
measuring 3.3 cm in diameter. Severe atherosclerotic disease. No
lymphadenopathy identified.

Reproductive: Prostate is unremarkable.  Right scrotal hydrocele.

Other: No ascites.

Musculoskeletal: Degenerative changes of the lumbar spine. No
suspicious bony lesions identified.
IMPRESSION: 1. No acute process identified in the abdomen or pelvis.
2. Colonic diverticulosis.
3. Stable 3.3 cm infrarenal abdominal aortic aneurysm. Recommend
follow-up ultrasound every 3 years. This recommendation follows ACR
consensus guidelines: White Paper of the ACR Incidental Findings
Committee II on Vascular Findings. [HOSPITAL] 6475;

## 2022-03-12 ENCOUNTER — Other Ambulatory Visit: Payer: Self-pay | Admitting: *Deleted

## 2022-03-12 MED ORDER — APIXABAN 5 MG PO TABS: 5.0000 mg | ORAL_TABLET | Freq: Two times a day (BID) | ORAL | 1 refills | Status: DC

## 2022-03-12 NOTE — Telephone Encounter (Signed)
Prescription refill request for Eliquis received. ?Indication: Atrial Flutter ?Last office visit: 08/30/21   Lawanda Cousins FNP ?Scr: 0.90 on 01/02/22 ?Age: 66 ?Weight: 89.7kg ? ?Based on above findings Eliquis '5mg'$  twice daily is the appropriate dose.  Refill approved. ? ?

## 2022-03-13 NOTE — Telephone Encounter (Signed)
Garrett Klein, I saw him recently. That's an old message. Sorry!!! We can see him back in 2-3 months.  ?

## 2022-03-19 ENCOUNTER — Ambulatory Visit (INDEPENDENT_AMBULATORY_CARE_PROVIDER_SITE_OTHER): Payer: Medicare Other | Admitting: Gastroenterology

## 2022-03-19 DIAGNOSIS — K52831 Collagenous colitis: Secondary | ICD-10-CM

## 2022-03-19 NOTE — Progress Notes (Signed)
No visit needed today. Keep appt for June 2023.  ?

## 2022-05-06 ENCOUNTER — Other Ambulatory Visit: Payer: Self-pay | Admitting: Cardiology

## 2022-05-14 ENCOUNTER — Encounter: Payer: Self-pay | Admitting: Gastroenterology

## 2022-05-14 ENCOUNTER — Ambulatory Visit (INDEPENDENT_AMBULATORY_CARE_PROVIDER_SITE_OTHER): Payer: Medicare Other | Admitting: Gastroenterology

## 2022-05-14 VITALS — BP 120/70 | HR 88 | Temp 97.4°F | Ht 70.0 in | Wt 203.0 lb

## 2022-05-14 DIAGNOSIS — K52831 Collagenous colitis: Secondary | ICD-10-CM | POA: Diagnosis not present

## 2022-05-14 DIAGNOSIS — M25552 Pain in left hip: Secondary | ICD-10-CM | POA: Diagnosis not present

## 2022-05-14 MED ORDER — LOPERAMIDE HCL 1 MG/7.5ML PO LIQD: 2.0000 mg | Freq: Four times a day (QID) | ORAL | 1 refills | Status: DC

## 2022-05-14 NOTE — Progress Notes (Signed)
Gastroenterology Office Note     Primary Care Physician:  [name], MD  Primary Gastroenterologist: Dr. Abbey Chatters   Chief Complaint   Chief Complaint  Patient presents with   Follow-up     History of Present Illness   Garrett Chiang. is a 66 y.o. male presenting today in follow-up with a history of collagenous colitis diagnosed in 2014. He has a remote history of polyps many years ago. Recent colonoscopy Feb 2023 with adenoma and collagenous colitis on biopsies. He required an extended budesonide course when seen April 2023 and recommendations to take Imodium prn no more than 4 times per day. Advised to quit smoking and avoidance of NSAIDs. Negative celiac disease.    Extended course of budesonide for an additional 60 days in April 2023, with taper thereafter. Has a lot of gas. Only time halfway formed is in the mornings. Rest of day is loose. Postprandial loose stools. Taking Imodium twice a day. Has occasional stomach cramping. Sometimes takes NSAIDs. +smoker.   He is requesting a referral to ortho for left hip pain.       Past Medical History:  Diagnosis Date   Atrial flutter ([place])    Chronic lower back pain    Collagenous colitis 2014   [facility]   COPD (chronic obstructive pulmonary disease) ([place])    Coronary artery calcification seen on CT scan    Essential hypertension    History of atrial flutter    Hyperlipidemia    MRSA infection    PUD (peptic ulcer disease)     Past Surgical History:  Procedure Laterality Date   BACK SURGERY     BIOPSY  06/03/2017   Procedure: BIOPSY;  Surgeon: Danie Binder, MD;  [facility];  Service: Endoscopy;;  colon   BIOPSY  01/10/2022   Procedure: BIOPSY;  Surgeon: Eloise Harman, DO;  [facility];  Service: Endoscopy;;   COLONOSCOPY  2001   Dr. Sharlett Iles, normal   COLONOSCOPY  04/2013   [facility]: Fentanyl '200mg'$ Danford Bad '10mg'$ : sigmoid diverticulosis, normal terminal ileum, small internal  hemorrhoids, random colon bx: collagenous colitis. next tcs 04/2018   COLONOSCOPY  2011   [facility]: two polyps, sessile serrated adenoma, mild diverticulosis   COLONOSCOPY WITH PROPOFOL N/A 06/03/2017   Dr. Oneida Alar: Diverticulosis, three 2 to 3 mm polyps removed from the mid transverse colon and cecum, three 4 to 6 mm polyps in the rectum and mid descending colon removed.  External/internal hemorrhoids.  hyperplastic polyps.  Random colon biopsies negative for microscopic colitis. next colonoscopy in 5 years.   COLONOSCOPY WITH PROPOFOL N/A 01/10/2022   Procedure: COLONOSCOPY WITH PROPOFOL;  Surgeon: Eloise Harman, DO;  [facility];  Service: Endoscopy;  Laterality: N/A;  11:15am   ELBOW BURSA SURGERY Right    ESOPHAGEAL DILATION N/A 05/19/2015   Procedure: ESOPHAGEAL DILATION;  Surgeon: Danie Binder, MD;  [facility];  Service: Endoscopy;  Laterality: N/A;   ESOPHAGOGASTRODUODENOSCOPY N/A 10/28/2014   SLF: 1. Esophagitis due to GERD. KOH neg. 2. Small hiatal hernia 3. Moderate erosive gastritis 4. RUQ pain due to large ulcer 5. [place] duodentis in the bulb.    ESOPHAGOGASTRODUODENOSCOPY N/A 05/19/2015   SLF: 1. Mild distal esophagitis 2. Peptic stricture at the gastroesophageal junction 3. Mild non-erosive gastritis 4. Pseudo pylorus due to prior PUD.    ESOPHAGOGASTRODUODENOSCOPY N/A 03/07/2017   Dr. Oneida Alar: Benign-appearing esophageal stenosis status post dilation,  mild gastritis   FINGER AMPUTATION Left 1985   index finger   HYDROCELE EXCISION Right 10/02/2018   Procedure: HYDROCELECTOMY ADULT;  Surgeon: Irine Seal, MD;  Location[facility];  Service: Urology;  Laterality: Right;   JOINT REPLACEMENT     left hip surgery - January 26, 2014   POLYPECTOMY  06/03/2017   Procedure: POLYPECTOMY;  Surgeon: Danie Binder, MD;  [facility];  Service: Endoscopy;;  colon   POLYPECTOMY  01/10/2022   Procedure: POLYPECTOMY;  Surgeon: Eloise Harman, DO;   [facility];  Service: Endoscopy;;   ROTATOR CUFF REPAIR Right    November 2014   SAVORY DILATION N/A 03/07/2017   Procedure: SAVORY DILATION;  Surgeon: Danie Binder, MD;  [facility];  Service: Endoscopy;  Laterality: N/A;    Current Outpatient Medications  Medication Sig Dispense Refill   apixaban (ELIQUIS) 5 MG TABS tablet Take 1 tablet (5 mg total) by mouth 2 (two) times daily. 180 tablet 1   atorvastatin (LIPITOR) 40 MG tablet Take 40 mg by mouth in the morning.     budesonide (ENTOCORT EC) 3 MG 24 hr capsule Take 3 capsules (9 mg total) by mouth daily for 60 days, THEN 2 capsules (6 mg total) daily for 14 days, THEN 1 capsule (3 mg total) daily for 14 days. 222 capsule 0   cholecalciferol (VITAMIN D3) 25 MCG (1000 UNIT) tablet Take 4,000 Units by mouth in the morning.     Coenzyme Q10 (CO Q 10) 100 MG CAPS Take 100 mg by mouth in the morning.     diltiazem (CARDIZEM CD) 240 MG 24 hr capsule TAKE 1 CAPSULE BY MOUTH  DAILY 100 capsule 2   EPINEPHrine 0.3 mg/0.3 mL IJ SOAJ injection Inject 0.3 mg into the muscle as needed (allergic reaction).      gabapentin (NEURONTIN) 300 MG capsule Take 300 mg by mouth 3 (three) times daily.     Glucosamine-Chondroitin 750-600 MG TABS Take 1 tablet by mouth in the morning.     loperamide (IMODIUM A-D) 2 MG tablet Take 1 tablet (2 mg total) by mouth 4 (four) times daily as needed for diarrhea or loose stools. 120 tablet 3   Multiple Vitamin (MULTIVITAMIN WITH MINERALS) TABS tablet Take 1 tablet by mouth daily. Centrum Multivitamin     omeprazole (PRILOSEC) 20 MG capsule Take 1 capsule (20 mg total) by mouth daily. 30 minutes before breakfast 90 capsule 3   sildenafil (VIAGRA) 100 MG tablet Take 100 mg by mouth daily as needed for erectile dysfunction.     No current facility-administered medications for this visit.    Allergies as of 05/14/2022 - Review Complete 05/14/2022  Allergen Reaction Noted   Bee venom Swelling  06/21/2012   Ciprofloxacin Other (See Comments) 08/06/2016   Levaquin [levofloxacin in d5w] Other (See Comments) 12/11/2016   Tramadol Hives 10/18/2014   Hydrocodone Rash 03/28/2013   Sulfa antibiotics Rash 03/28/2013    Family History  Problem Relation Age of Onset   Ovarian cancer Mother    Diabetes type II Father    Heart attack Father    Arthritis Sister    Stroke Brother    Coronary artery disease Brother    Diabetes Other    Heart attack Other    Colon cancer Neg Hx    Liver disease Neg Hx    Ulcers Neg Hx     Social History   Socioeconomic History   Marital status:  Widowed    Spouse name: Not on file   Number of children: Not on file   Years of education: Not on file   Highest education level: Not on file  Occupational History   Not on file  Tobacco Use   Smoking status: Every Day    Packs/day: 0.50    Years: 40.00    Total pack years: 20.00    Types: Cigarettes    Start date: 01/09/1969   Smokeless tobacco: Never  Vaping Use   Vaping Use: Never used  Substance and Sexual Activity   Alcohol use: No    Alcohol/week: 0.0 standard drinks of alcohol   Drug use: No   Sexual activity: Not on file  Other Topics Concern   Not on file  Social History Narrative   Not on file   Social Determinants of Health   Financial Resource Strain: Not on file  Food Insecurity: Not on file  Transportation Needs: Not on file  Physical Activity: Not on file  Stress: Not on file  Social Connections: Not on file  Intimate Partner Violence: Not on file     Review of Systems   Gen: Denies any fever, chills, fatigue, weight loss, lack of appetite.  CV: Denies chest pain, heart palpitations, peripheral edema, syncope.  Resp: Denies shortness of breath at rest or with exertion. Denies wheezing or cough.  GI: see HPI GU : Denies urinary burning, urinary frequency, urinary hesitancy MS: Denies joint pain, muscle weakness, cramps, or limitation of movement.  Derm: Denies  rash, itching, dry skin Psych: Denies depression, anxiety, memory loss, and confusion Heme: Denies bruising, bleeding, and enlarged lymph nodes.   Physical Exam   BP 120/70   Pulse 88   Temp (!) 97.4 F (36.3 C)   Ht '5\' 10"'$  (1.778 m)   Wt 203 lb (92.1 kg)   BMI 29.13 kg/m  General:   Alert and oriented. Pleasant and cooperative. Well-nourished and well-developed.  Head:  Normocephalic and atraumatic. Eyes:  Without icterus Abdomen:  +BS, soft, non-tender and non-distended. No HSM noted. No guarding or rebound. No masses appreciated.  Rectal:  Deferred  Msk:  Symmetrical without gross deformities. Normal posture. Extremities:  Without edema. Neurologic:  Alert and  oriented x4;  grossly normal neurologically. Skin:  Intact without significant lesions or rashes. Psych:  Alert and cooperative. Normal mood and affect.   Assessment   Garrett Klein. is a 67 y.o. male presenting today in follow-up with a history of collagenous colitis diagnosed in 2014. He has a remote history of polyps many years ago. Recent colonoscopy Feb 2023 with adenoma and collagenous colitis on biopsies. Returning for follow-up. Negative celiac serologies.   Microscopic colitis: extended course of budesonide recently in April 2023 with taper following. He continues to take NSAIDs and smokes. We discussed at length how this will worsen symptoms. I have asked him to take Imodium up to 4 times a day. He is to call with an update.   PLAN    Increase Imodium to QID Labs today Avoid smoking and NSAIDs Referral to ortho regarding left hip  Annitta Needs, PhD, ANP-BC [facility]

## 2022-05-16 ENCOUNTER — Other Ambulatory Visit: Payer: Self-pay | Admitting: Cardiology

## 2022-05-16 DIAGNOSIS — I4892 Unspecified atrial flutter: Secondary | ICD-10-CM

## 2022-05-17 NOTE — Telephone Encounter (Signed)
Eliquis '5mg'$  refill request received. Patient is 67 years old, weight-92.1kg, Crea-0.90 on 01/02/22, Diagnosis-Aflutter, and last seen by Levell July, NP on 08/30/2021. Dose is appropriate based on dosing criteria. Will send in refill to requested pharmacy.

## 2022-06-05 ENCOUNTER — Other Ambulatory Visit: Payer: Self-pay | Admitting: Cardiology

## 2022-06-05 ENCOUNTER — Ambulatory Visit (INDEPENDENT_AMBULATORY_CARE_PROVIDER_SITE_OTHER): Payer: Medicare Other | Admitting: Cardiology

## 2022-06-05 ENCOUNTER — Encounter: Payer: Self-pay | Admitting: Cardiology

## 2022-06-05 VITALS — BP 140/84 | HR 87 | Ht 70.0 in | Wt 200.0 lb

## 2022-06-05 DIAGNOSIS — I2510 Atherosclerotic heart disease of native coronary artery without angina pectoris: Secondary | ICD-10-CM

## 2022-06-05 DIAGNOSIS — I739 Peripheral vascular disease, unspecified: Secondary | ICD-10-CM

## 2022-06-05 DIAGNOSIS — Z8679 Personal history of other diseases of the circulatory system: Secondary | ICD-10-CM | POA: Diagnosis not present

## 2022-06-05 DIAGNOSIS — M79604 Pain in right leg: Secondary | ICD-10-CM

## 2022-06-05 DIAGNOSIS — M79605 Pain in left leg: Secondary | ICD-10-CM | POA: Diagnosis not present

## 2022-06-05 NOTE — Patient Instructions (Addendum)
Medication Instructions:  Your physician recommends that you continue on your current medications as directed. Please refer to the Current Medication list given to you today.  Labwork: none  Testing/Procedures: Your physician has requested that you have a lower extremity arterial exercise duplex. During this test, exercise and ultrasound are used to evaluate arterial blood flow in the legs. Allow one hour for this exam. There are no restrictions or special instructions. Your physician has requested that you have an ankle brachial index (ABI). During this test an ultrasound and blood pressure cuff are used to evaluate the arteries that supply the arms and legs with blood. Allow thirty minutes for this exam. There are no restrictions or special instructions.  Follow-Up: Your physician recommends that you schedule a follow-up appointment in: 6 months  Any Other Special Instructions Will Be Listed Below (If Applicable).  If you need a refill on your cardiac medications before your next appointment, please call your pharmacy.

## 2022-06-05 NOTE — Progress Notes (Signed)
Cardiology Office Note  Date: 06/05/2022   ID: Garrett Salinas Sr., DOB 03/14/1956, MRN [id number]  PCP:  [name], MD  Cardiologist:  Rozann Lesches, MD Electrophysiologist:  None   Chief Complaint  Patient presents with   Cardiac follow-up    History of Present Illness: Garrett Klein. is a 66 y.o. male last seen in October 2022 by Mr. Leonides Sake NP.  He is here for a routine visit.  He tells me that just recently he was leaning over and twisting in bed to get his phone charging cord when he suddenly had a sharp, stabbing pain in the left side of his chest.  States that it has felt somewhat sore in his rib cage and is suggestive of an intercostal muscle etiology.  Otherwise, no definite angina symptoms, no sense of palpitations.  He also reports discomfort in his legs, numbness that sounds potentially neuropathic, but cannot exclude claudication.  He also has right hip pain, has had both hips replaced.  Orthopedic follow-up is pending.  I reviewed his medications.  He continues on Cardizem CD and Eliquis, also Lipitor with atherosclerosis evident by CT imaging.  He follows lab work with PCP and also through the [facility] system.  I personally reviewed his ECG today which shows normal sinus rhythm with right bundle branch block, stable compared to prior tracing.  Past Medical History:  Diagnosis Date   Atrial flutter ([place])    Chronic lower back pain    Collagenous colitis 2014   [facility]   COPD (chronic obstructive pulmonary disease) ([place])    Coronary artery calcification seen on CT scan    Essential hypertension    History of atrial flutter    Hyperlipidemia    MRSA infection    PUD (peptic ulcer disease)     Past Surgical History:  Procedure Laterality Date   BACK SURGERY     BIOPSY  06/03/2017   Procedure: BIOPSY;  Surgeon: Danie Binder, MD;  [facility];  Service: Endoscopy;;  colon   BIOPSY  01/10/2022   Procedure: BIOPSY;  Surgeon: Eloise Harman, DO;  [facility];  Service: Endoscopy;;   COLONOSCOPY  2001   Dr. Sharlett Iles, normal   COLONOSCOPY  04/2013   [facility]: Fentanyl '200mg'$ Danford Bad '10mg'$ : sigmoid diverticulosis, normal terminal ileum, small internal hemorrhoids, random colon bx: collagenous colitis. next tcs 04/2018   COLONOSCOPY  2011   [facility]: two polyps, sessile serrated adenoma, mild diverticulosis   COLONOSCOPY WITH PROPOFOL N/A 06/03/2017   Dr. Oneida Alar: Diverticulosis, three 2 to 3 mm polyps removed from the mid transverse colon and cecum, three 4 to 6 mm polyps in the rectum and mid descending colon removed.  External/internal hemorrhoids.  hyperplastic polyps.  Random colon biopsies negative for microscopic colitis. next colonoscopy in 5 years.   COLONOSCOPY WITH PROPOFOL N/A 01/10/2022   Procedure: COLONOSCOPY WITH PROPOFOL;  Surgeon: Eloise Harman, DO;  [facility];  Service: Endoscopy;  Laterality: N/A;  11:15am   ELBOW BURSA SURGERY Right    ESOPHAGEAL DILATION N/A 05/19/2015   Procedure: ESOPHAGEAL DILATION;  Surgeon: Danie Binder, MD;  [facility];  Service: Endoscopy;  Laterality: N/A;   ESOPHAGOGASTRODUODENOSCOPY N/A 10/28/2014   SLF: 1. Esophagitis due to GERD. KOH neg. 2. Small hiatal hernia 3. Moderate erosive gastritis 4. RUQ pain due to large ulcer 5. [place] duodentis in the bulb.    ESOPHAGOGASTRODUODENOSCOPY N/A 05/19/2015  SLF: 1. Mild distal esophagitis 2. Peptic stricture at the gastroesophageal junction 3. Mild non-erosive gastritis 4. Pseudo pylorus due to prior PUD.    ESOPHAGOGASTRODUODENOSCOPY N/A 03/07/2017   Dr. Oneida Alar: Benign-appearing esophageal stenosis status post dilation, mild gastritis   FINGER AMPUTATION Left 1985   index finger   HYDROCELE EXCISION Right 10/02/2018   Procedure: HYDROCELECTOMY ADULT;  Surgeon: Irine Seal, MD;  Location: [facility];  Service: Urology;  Laterality: Right;   JOINT REPLACEMENT     left hip surgery - January 26, 2014    POLYPECTOMY  06/03/2017   Procedure: POLYPECTOMY;  Surgeon: Danie Binder, MD;  [facility];  Service: Endoscopy;;  colon   POLYPECTOMY  01/10/2022   Procedure: POLYPECTOMY;  Surgeon: Eloise Harman, DO;  [facility];  Service: Endoscopy;;   ROTATOR CUFF REPAIR Right    November 2014   SAVORY DILATION N/A 03/07/2017   Procedure: SAVORY DILATION;  Surgeon: Danie Binder, MD;  [facility];  Service: Endoscopy;  Laterality: N/A;    Current Outpatient Medications  Medication Sig Dispense Refill   albuterol (VENTOLIN HFA) 108 (90 Base) MCG/ACT inhaler Inhale into the lungs every 6 (six) hours as needed for wheezing or shortness of breath.     apixaban (ELIQUIS) 5 MG TABS tablet TAKE 1 TABLET BY MOUTH TWICE  DAILY 180 tablet 1   atorvastatin (LIPITOR) 40 MG tablet Take 40 mg by mouth in the morning.     cholecalciferol (VITAMIN D3) 25 MCG (1000 UNIT) tablet Take 4,000 Units by mouth in the morning.     Coenzyme Q10 (CO Q 10) 100 MG CAPS Take 100 mg by mouth in the morning.     diltiazem (CARDIZEM CD) 240 MG 24 hr capsule TAKE 1 CAPSULE BY MOUTH  DAILY 100 capsule 2   EPINEPHrine 0.3 mg/0.3 mL IJ SOAJ injection Inject 0.3 mg into the muscle as needed (allergic reaction).      gabapentin (NEURONTIN) 300 MG capsule Take 300 mg by mouth 3 (three) times daily.     Glucosamine-Chondroitin 750-600 MG TABS Take 1 tablet by mouth in the morning.     Loperamide HCl (IMODIUM A-D) 1 MG/7.5ML LIQD Take 15 mLs (2 mg total) by mouth in the morning, at noon, in the evening, and at bedtime. As needed for diarrhea. 237 mL 1   melatonin 1 MG TABS tablet Take 1 mg by mouth at bedtime.     Multiple Vitamin (MULTIVITAMIN WITH MINERALS) TABS tablet Take 1 tablet by mouth daily. Centrum Multivitamin     omeprazole (PRILOSEC) 20 MG capsule Take 1 capsule (20 mg total) by mouth daily. 30 minutes before breakfast 90 capsule 3   sildenafil (VIAGRA) 100 MG tablet Take 100 mg by  mouth daily as needed for erectile dysfunction.     No current facility-administered medications for this visit.   Allergies:  Bee venom, Ciprofloxacin, Levaquin [levofloxacin in d5w], Tramadol, Hydrocodone, and Sulfa antibiotics   ROS: No syncope.  Physical Exam: VS:  BP 140/84 (BP Location: Left Arm, Patient Position: Sitting, Cuff Size: Normal)   Pulse 87   Ht '5\' 10"'$  (1.778 m)   Wt 200 lb (90.7 kg)   SpO2 93%   BMI 28.70 kg/m , BMI Body mass index is 28.7 kg/m.  Wt Readings from Last 3 Encounters:  06/05/22 200 lb (90.7 kg)  05/14/22 203 lb (92.1 kg)  03/05/22 190 lb (86.2 kg)    General: Patient appears comfortable at  rest. HEENT: Conjunctiva and lids normal, oropharynx clear. Neck: Supple, no elevated JVP or carotid bruits, no thyromegaly. Lungs: Clear to auscultation, nonlabored breathing at rest. Cardiac: Regular rate and rhythm, no S3 or significant systolic murmur, no pericardial rub. Extremities: No pitting edema.  ECG:  An ECG dated 08/30/2021 was personally reviewed today and demonstrated:  Sinus rhythm with right bundle branch block.  Recent Labwork: 01/02/2022: Creatinine, Ser 0.90  October 2022: Hemoglobin 16.0, platelets 309, potassium 3.8, BUN 11, creatinine 0.94  Other Studies Reviewed Today:  Lexiscan Myoview 08/08/2015 [facility]): No evidence of scar or ischemia with LVEF 59%.   Echocardiogram 01/27/2020 [facility]): Summary    1. The left ventricle is normal in size with normal wall thickness.    2. The left ventricular systolic function is normal, LVEF is visually  estimated at 55-60%.    3. There is mild mitral valve regurgitation.    4. There is mild aortic regurgitation.    5. The left atrium is mildly dilated in size.    6. The right ventricle is upper normal in size, with normal systolic  function.    7. There is mild pulmonary hypertension, estimated pulmonary artery systolic  pressure is 41 mmHg.    8. The right atrium is mildly  dilated  in size.    Cardiac monitor August 2021: Zio AT reviewed, 14 days analyzed.  Predominant rhythm is sinus with heart rate ranging from 58 bpm up to 156 bpm and average heart rate 87 bpm. Rare PACs and PVCs were noted representing less than 1% total beats. There were no sustained arrhythmias or pauses.  Assessment and Plan:  1.  History of paroxysmal atrial flutter with CHA2DS2-VASc score of 5.  He is symptomatically stable on Cardizem CD and on Eliquis for stroke prophylaxis.  Continue to follow lab work with PCP and through the [facility] system.  He does not report any spontaneous bleeding problems.  2.  Coronary artery calcification evident by CT imaging.  No definite angina reported.  He is on Lipitor, no longer on aspirin given use of Eliquis.  3.  Recent atypical left-sided thoracic discomfort, likely intercostal muscle strain.  ECG reviewed and stable.  4.  Bilateral leg pain and numbness.  Possibly neuropathic however cannot exclude claudication.  Obtain ABIs and lower extremity arterial Dopplers.  Medication Adjustments/Labs and Tests Ordered: Current medicines are reviewed at length with the patient today.  Concerns regarding medicines are outlined above.   Tests Ordered: Orders Placed This Encounter  Procedures   EKG 12-Lead   VAS [place] LOWER EXT ART SEG MULTI (SEGMENTALS & LE RAYNAUDS)    Medication Changes: No orders of the defined types were placed in this encounter.   Disposition:  Follow up  6 months.  Signed, Satira Sark, MD, [facility] 06/05/2022 2:51 PM    [facility], [place], [place] [address] Phone: [phone]; Fax: [phone]

## 2022-06-07 ENCOUNTER — Ambulatory Visit (INDEPENDENT_AMBULATORY_CARE_PROVIDER_SITE_OTHER): Payer: Medicare Other

## 2022-06-07 ENCOUNTER — Ambulatory Visit (INDEPENDENT_AMBULATORY_CARE_PROVIDER_SITE_OTHER): Payer: Medicare Other | Admitting: Orthopedic Surgery

## 2022-06-07 ENCOUNTER — Encounter: Payer: Self-pay | Admitting: Orthopedic Surgery

## 2022-06-07 VITALS — BP 160/87 | HR 102 | Ht 70.0 in | Wt 199.0 lb

## 2022-06-07 DIAGNOSIS — Z96642 Presence of left artificial hip joint: Secondary | ICD-10-CM | POA: Diagnosis not present

## 2022-06-07 DIAGNOSIS — M25552 Pain in left hip: Secondary | ICD-10-CM

## 2022-06-07 DIAGNOSIS — K529 Noninfective gastroenteritis and colitis, unspecified: Secondary | ICD-10-CM | POA: Diagnosis not present

## 2022-06-07 DIAGNOSIS — R1310 Dysphagia, unspecified: Secondary | ICD-10-CM | POA: Diagnosis not present

## 2022-06-07 DIAGNOSIS — K279 Peptic ulcer, site unspecified, unspecified as acute or chronic, without hemorrhage or perforation: Secondary | ICD-10-CM | POA: Diagnosis not present

## 2022-06-07 DIAGNOSIS — K52831 Collagenous colitis: Secondary | ICD-10-CM | POA: Diagnosis not present

## 2022-06-07 DIAGNOSIS — K219 Gastro-esophageal reflux disease without esophagitis: Secondary | ICD-10-CM | POA: Diagnosis not present

## 2022-06-07 DIAGNOSIS — K2210 Ulcer of esophagus without bleeding: Secondary | ICD-10-CM | POA: Diagnosis not present

## 2022-06-07 NOTE — Patient Instructions (Signed)
You have been referred to Dr. Mayer Camel in [place]. We have placed the referral be expecting a phone call from their office. If you do not hear from them in a week please call our office back.

## 2022-06-07 NOTE — Progress Notes (Signed)
New Patient Visit  Assessment: Garrett Golladay. is a 66 y.o. male with the following: 1. Pain in left hip 2. H/O total hip arthroplasty, left  Plan: Garrett FLINN Sr. has pain in the left lateral hip.  He has a history of multiple hip surgeries, including a total hip replacement, that was revised from a bipolar hemiarthroplasty in 2014.  Pain started in January of this year.  It is not getting better.  He states his pain is worse now than it was prior to his revision surgery.  We reviewed radiographs in clinic, but we did not have any comparison films.  There is nothing obvious with the hip replacement at this time.  However, since I do not do total hip arthroplasty, I think it is prudent for him to be evaluated by someone who could potentially revise and has a better understanding of the appearance of the hip replacement on radiographs.  He is in agreement with this plan.  We did discuss that it is possible that the pain he is experiencing is coming from his lower back, once again based on the radiographs, I could not rule out issues related to the prior arthroplasty.  Follow-up in clinic as needed.  Follow-up: Return if symptoms worsen or fail to improve.  Subjective:  Chief Complaint  Patient presents with   Hip Pain    Left states painful as before surgery to replace hip / surgery done at [facility], he also states has trouble with Rib pain,he has been advised we do not treat rib pain here, he voiced understanding     History of Present Illness: Garrett Seiden. is a 66 y.o. male who has been referred by  Roseanne Kaufman, NP for evaluation of left hip pain.  He complains of severe pain over the lateral left hip.  He states he lifted something heavy approximately 6-7 months ago, and noted severe pain in the lateral aspect of his hip.  He immediately dropped what he was caring.  Since then, he has continued to have pain in the left hip.  He had an industrial accident many years ago, and required a  partial hip replacement.  This was revised to a total hip arthroplasty in 2014.  He had done well initially.  He had no issues with healing.  He has not had any infections related to his current hip replacement.  His health is remained stable.  He has difficulty ambulating as a result of the pain.  Medications have not been helpful.   Review of Systems: No fevers or chills No numbness or tingling No chest pain No shortness of breath No bowel or bladder dysfunction No GI distress No headaches   Medical History:  Past Medical History:  Diagnosis Date   Atrial flutter ([place])    Chronic lower back pain    Collagenous colitis 2014   [facility]   COPD (chronic obstructive pulmonary disease) ([place])    Coronary artery calcification seen on CT scan    Essential hypertension    History of atrial flutter    Hyperlipidemia    MRSA infection    PUD (peptic ulcer disease)     Past Surgical History:  Procedure Laterality Date   BACK SURGERY     BIOPSY  06/03/2017   Procedure: BIOPSY;  Surgeon: Danie Binder, MD;  [facility];  Service: Endoscopy;;  colon   BIOPSY  01/10/2022   Procedure: BIOPSY;  Surgeon: Eloise Harman, DO;  [facility];  Service: Endoscopy;;   COLONOSCOPY  2001   Dr. Sharlett Iles, normal   COLONOSCOPY  04/2013   [facility]: Fentanyl '200mg'$ Danford Bad '10mg'$ : sigmoid diverticulosis, normal terminal ileum, small internal hemorrhoids, random colon bx: collagenous colitis. next tcs 04/2018   COLONOSCOPY  2011   [facility]: two polyps, sessile serrated adenoma, mild diverticulosis   COLONOSCOPY WITH PROPOFOL N/A 06/03/2017   Dr. Oneida Alar: Diverticulosis, three 2 to 3 mm polyps removed from the mid transverse colon and cecum, three 4 to 6 mm polyps in the rectum and mid descending colon removed.  External/internal hemorrhoids.  hyperplastic polyps.  Random colon biopsies negative for microscopic colitis. next colonoscopy in 5 years.   COLONOSCOPY WITH PROPOFOL N/A  01/10/2022   Procedure: COLONOSCOPY WITH PROPOFOL;  Surgeon: Eloise Harman, DO;  [facility];  Service: Endoscopy;  Laterality: N/A;  11:15am   ELBOW BURSA SURGERY Right    ESOPHAGEAL DILATION N/A 05/19/2015   Procedure: ESOPHAGEAL DILATION;  Surgeon: Danie Binder, MD;  [facility];  Service: Endoscopy;  Laterality: N/A;   ESOPHAGOGASTRODUODENOSCOPY N/A 10/28/2014   SLF: 1. Esophagitis due to GERD. KOH neg. 2. Small hiatal hernia 3. Moderate erosive gastritis 4. RUQ pain due to large ulcer 5. [place] duodentis in the bulb.    ESOPHAGOGASTRODUODENOSCOPY N/A 05/19/2015   SLF: 1. Mild distal esophagitis 2. Peptic stricture at the gastroesophageal junction 3. Mild non-erosive gastritis 4. Pseudo pylorus due to prior PUD.    ESOPHAGOGASTRODUODENOSCOPY N/A 03/07/2017   Dr. Oneida Alar: Benign-appearing esophageal stenosis status post dilation, mild gastritis   FINGER AMPUTATION Left 1985   index finger   HYDROCELE EXCISION Right 10/02/2018   Procedure: HYDROCELECTOMY ADULT;  Surgeon: Irine Seal, MD;  Location: [facility];  Service: Urology;  Laterality: Right;   JOINT REPLACEMENT     left hip surgery - January 26, 2014   POLYPECTOMY  06/03/2017   Procedure: POLYPECTOMY;  Surgeon: Danie Binder, MD;  [facility];  Service: Endoscopy;;  colon   POLYPECTOMY  01/10/2022   Procedure: POLYPECTOMY;  Surgeon: Eloise Harman, DO;  [facility];  Service: Endoscopy;;   ROTATOR CUFF REPAIR Right    November 2014   SAVORY DILATION N/A 03/07/2017   Procedure: SAVORY DILATION;  Surgeon: Danie Binder, MD;  [facility];  Service: Endoscopy;  Laterality: N/A;    Family History  Problem Relation Age of Onset   Ovarian cancer Mother    Diabetes type II Father    Heart attack Father    Arthritis Sister    Stroke Brother    Coronary artery disease Brother    Diabetes Other    Heart attack Other    Colon cancer Neg Hx    Liver disease Neg Hx     Ulcers Neg Hx    Social History   Tobacco Use   Smoking status: Every Day    Packs/day: 0.50    Years: 40.00    Total pack years: 20.00    Types: Cigarettes    Start date: 01/09/1969   Smokeless tobacco: Never  Vaping Use   Vaping Use: Never used  Substance Use Topics   Alcohol use: No    Alcohol/week: 0.0 standard drinks of alcohol   Drug use: No    Allergies  Allergen Reactions   Bee Venom Swelling   Ciprofloxacin Other (See Comments)    Turns red from the chest up.   Levaquin [Levofloxacin  In D5w] Other (See Comments)    CANDIDA ALL OVER MOUTH TO PENIS   Tramadol Hives        Hydrocodone Rash   Sulfa Antibiotics Rash    Current Meds  Medication Sig   albuterol (VENTOLIN HFA) 108 (90 Base) MCG/ACT inhaler Inhale into the lungs every 6 (six) hours as needed for wheezing or shortness of breath.   apixaban (ELIQUIS) 5 MG TABS tablet TAKE 1 TABLET BY MOUTH TWICE  DAILY   atorvastatin (LIPITOR) 40 MG tablet Take 40 mg by mouth in the morning.   cholecalciferol (VITAMIN D3) 25 MCG (1000 UNIT) tablet Take 4,000 Units by mouth in the morning.   Coenzyme Q10 (CO Q 10) 100 MG CAPS Take 100 mg by mouth in the morning.   diltiazem (CARDIZEM CD) 240 MG 24 hr capsule TAKE 1 CAPSULE BY MOUTH  DAILY   EPINEPHrine 0.3 mg/0.3 mL IJ SOAJ injection Inject 0.3 mg into the muscle as needed (allergic reaction).    gabapentin (NEURONTIN) 300 MG capsule Take 300 mg by mouth 3 (three) times daily.   Glucosamine-Chondroitin 750-600 MG TABS Take 1 tablet by mouth in the morning.   Loperamide HCl (IMODIUM A-D) 1 MG/7.5ML LIQD Take 15 mLs (2 mg total) by mouth in the morning, at noon, in the evening, and at bedtime. As needed for diarrhea.   melatonin 1 MG TABS tablet Take 1 mg by mouth at bedtime.   Multiple Vitamin (MULTIVITAMIN WITH MINERALS) TABS tablet Take 1 tablet by mouth daily. Centrum Multivitamin   omeprazole (PRILOSEC) 20 MG capsule Take 1 capsule (20 mg total) by mouth daily. 30  minutes before breakfast   sildenafil (VIAGRA) 100 MG tablet Take 100 mg by mouth daily as needed for erectile dysfunction.    Objective: BP (!) 160/87   Pulse (!) 102   Ht '5\' 10"'$  (1.778 m)   Wt 199 lb (90.3 kg)   BMI 28.55 kg/m   Physical Exam:  General: Alert and oriented. and No acute distress. Gait: Left sided antalgic gait.  Evaluation of the left hip demonstrates well-healed surgical incisions.  No surrounding erythema or drainage.  He has tenderness to palpation within the lateral posterior aspect of his hip.  Tenderness within the left buttock.  Negative straight leg raise.  He has intact sensation over the dorsum of his foot.  1+ DP pulse.  IMAGING: I personally ordered and reviewed the following images   X-rays of the left hip were obtained in clinic today.  No prior x-rays are available.  There is a revision total hip arthroplasty in stable positioning.  There does appear to be some lucency around the proximal aspect of the stem.  The femoral head is concentrically reduced within the acetabulum.  No acute fractures are noted.  Impression: Revision total hip arthroplasty without obvious acute injury, subsidence or loosening   New Medications:  No orders of the defined types were placed in this encounter.     Mordecai Rasmussen, MD  06/07/2022 9:55 PM

## 2022-06-08 LAB — CBC WITH DIFFERENTIAL/PLATELET
Absolute Monocytes: 632 cells/uL (ref 200–950)
Basophils Absolute: 112 cells/uL (ref 0–200)
Basophils Relative: 1.1 %
Eosinophils Absolute: 71 cells/uL (ref 15–500)
Eosinophils Relative: 0.7 %
HCT: 49.3 % (ref 38.5–50.0)
Hemoglobin: 16.5 g/dL (ref 13.2–17.1)
Lymphs Abs: 765 cells/uL — ABNORMAL LOW (ref 850–3900)
MCH: 31 pg (ref 27.0–33.0)
MCHC: 33.5 g/dL (ref 32.0–36.0)
MCV: 92.5 fL (ref 80.0–100.0)
MPV: 10.1 fL (ref 7.5–12.5)
Monocytes Relative: 6.2 %
Neutro Abs: 8619 cells/uL — ABNORMAL HIGH (ref 1500–7800)
Neutrophils Relative %: 84.5 %
Platelets: 330 10*3/uL (ref 140–400)
RBC: 5.33 10*6/uL (ref 4.20–5.80)
RDW: 13.8 % (ref 11.0–15.0)
Total Lymphocyte: 7.5 %
WBC: 10.2 10*3/uL (ref 3.8–10.8)

## 2022-06-08 LAB — COMPLETE METABOLIC PANEL WITH GFR
AG Ratio: 2 (calc) (ref 1.0–2.5)
ALT: 22 U/L (ref 9–46)
AST: 20 U/L (ref 10–35)
Albumin: 4.5 g/dL (ref 3.6–5.1)
Alkaline phosphatase (APISO): 98 U/L (ref 35–144)
BUN: 16 mg/dL (ref 7–25)
CO2: 25 mmol/L (ref 20–32)
Calcium: 9.9 mg/dL (ref 8.6–10.3)
Chloride: 101 mmol/L (ref 98–110)
Creat: 1.09 mg/dL (ref 0.70–1.35)
Globulin: 2.3 g/dL (calc) (ref 1.9–3.7)
Glucose, Bld: 98 mg/dL (ref 65–99)
Potassium: 5.3 mmol/L (ref 3.5–5.3)
Sodium: 141 mmol/L (ref 135–146)
Total Bilirubin: 0.6 mg/dL (ref 0.2–1.2)
Total Protein: 6.8 g/dL (ref 6.1–8.1)
eGFR: 74 mL/min/{1.73_m2} (ref >=60)

## 2022-06-08 LAB — TSH: TSH: 1.31 mIU/L (ref 0.40–4.50)

## 2022-06-11 ENCOUNTER — Other Ambulatory Visit: Payer: Self-pay | Admitting: Cardiology

## 2022-06-11 ENCOUNTER — Ambulatory Visit (INDEPENDENT_AMBULATORY_CARE_PROVIDER_SITE_OTHER): Payer: Medicare Other

## 2022-06-11 DIAGNOSIS — I739 Peripheral vascular disease, unspecified: Secondary | ICD-10-CM

## 2022-06-14 ENCOUNTER — Telehealth: Payer: Self-pay | Admitting: Orthopedic Surgery

## 2022-06-14 NOTE — Telephone Encounter (Signed)
Patient called for 'copy of Xrays' done by Dr Amedeo Kinsman at visit 06/07/22 for the [facility] - I provided patient with [facility] radiology contact number. Thanked [place] and will let us know if anything else is needed.

## 2022-06-18 ENCOUNTER — Telehealth: Payer: Self-pay

## 2022-06-18 ENCOUNTER — Other Ambulatory Visit (HOSPITAL_COMMUNITY): Payer: Self-pay | Admitting: Internal Medicine

## 2022-06-18 DIAGNOSIS — T07XXXA Unspecified multiple injuries, initial encounter: Secondary | ICD-10-CM

## 2022-06-18 NOTE — Telephone Encounter (Signed)
A prescription/medical necessity form for this patient has been placed in your box to be filled out for this patients pullups, underpads and gloves.

## 2022-06-27 DIAGNOSIS — I10 Essential (primary) hypertension: Secondary | ICD-10-CM | POA: Diagnosis not present

## 2022-06-27 DIAGNOSIS — M545 Low back pain, unspecified: Secondary | ICD-10-CM | POA: Diagnosis not present

## 2022-06-27 DIAGNOSIS — F17210 Nicotine dependence, cigarettes, uncomplicated: Secondary | ICD-10-CM | POA: Diagnosis not present

## 2022-06-27 DIAGNOSIS — Z299 Encounter for prophylactic measures, unspecified: Secondary | ICD-10-CM | POA: Diagnosis not present

## 2022-06-27 DIAGNOSIS — E7800 Pure hypercholesterolemia, unspecified: Secondary | ICD-10-CM | POA: Diagnosis not present

## 2022-06-27 DIAGNOSIS — Z0000 Encounter for general adult medical examination without abnormal findings: Secondary | ICD-10-CM | POA: Diagnosis not present

## 2022-06-27 DIAGNOSIS — Z7189 Other specified counseling: Secondary | ICD-10-CM | POA: Diagnosis not present

## 2022-06-27 DIAGNOSIS — J449 Chronic obstructive pulmonary disease, unspecified: Secondary | ICD-10-CM | POA: Diagnosis not present

## 2022-06-28 DIAGNOSIS — Z79899 Other long term (current) drug therapy: Secondary | ICD-10-CM | POA: Diagnosis not present

## 2022-06-28 DIAGNOSIS — R5383 Other fatigue: Secondary | ICD-10-CM | POA: Diagnosis not present

## 2022-06-28 DIAGNOSIS — E7800 Pure hypercholesterolemia, unspecified: Secondary | ICD-10-CM | POA: Diagnosis not present

## 2022-06-28 NOTE — Telephone Encounter (Signed)
Torrie Mayers, I am moving Mr Colledge's paperwork to the yellow folder on your desk to get to next week.

## 2022-07-01 NOTE — Telephone Encounter (Signed)
Form has been filled out and faxed back to the company.

## 2022-07-03 ENCOUNTER — Ambulatory Visit (HOSPITAL_COMMUNITY)
Admission: RE | Admit: 2022-07-03 | Discharge: 2022-07-03 | Disposition: A | Discharge status: Home / Self Care | Payer: Medicare Other | Source: Ambulatory Visit | Attending: Internal Medicine | Admitting: Internal Medicine

## 2022-07-03 DIAGNOSIS — Z7952 Long term (current) use of systemic steroids: Secondary | ICD-10-CM | POA: Diagnosis not present

## 2022-07-03 DIAGNOSIS — T07XXXA Unspecified multiple injuries, initial encounter: Secondary | ICD-10-CM | POA: Diagnosis not present

## 2022-07-03 DIAGNOSIS — Z13820 Encounter for screening for osteoporosis: Secondary | ICD-10-CM | POA: Insufficient documentation

## 2022-07-11 ENCOUNTER — Ambulatory Visit: Payer: Medicare Other | Admitting: Cardiology

## 2022-08-05 ENCOUNTER — Encounter: Payer: Self-pay | Admitting: *Deleted

## 2022-08-12 ENCOUNTER — Other Ambulatory Visit: Payer: Self-pay | Admitting: Cardiology

## 2022-08-12 DIAGNOSIS — I4892 Unspecified atrial flutter: Secondary | ICD-10-CM

## 2022-08-13 NOTE — Telephone Encounter (Signed)
Prescription refill request for Eliquis received. Indication: A Flutter Last office visit: 06/05/22  Myles Gip MD Scr: 1.09 on 06/07/22 Age: 66 Weight:  90.7kg  Based on above findings Eliquis '5mg'$  twice daily is the appropriate dose.  Refill approved.

## 2022-08-30 ENCOUNTER — Other Ambulatory Visit (HOSPITAL_COMMUNITY): Payer: Self-pay | Admitting: Nurse Practitioner

## 2022-08-30 DIAGNOSIS — F17200 Nicotine dependence, unspecified, uncomplicated: Secondary | ICD-10-CM

## 2022-09-25 ENCOUNTER — Ambulatory Visit: Payer: Medicare Other | Admitting: Internal Medicine

## 2022-10-23 ENCOUNTER — Other Ambulatory Visit: Payer: Self-pay | Admitting: Gastroenterology

## 2022-10-24 ENCOUNTER — Ambulatory Visit (INDEPENDENT_AMBULATORY_CARE_PROVIDER_SITE_OTHER): Payer: Medicare Other | Admitting: Internal Medicine

## 2022-10-24 ENCOUNTER — Encounter: Payer: Self-pay | Admitting: Internal Medicine

## 2022-10-24 VITALS — BP 134/85 | HR 89 | Temp 97.2°F | Ht 70.0 in | Wt 204.0 lb

## 2022-10-24 DIAGNOSIS — D123 Benign neoplasm of transverse colon: Secondary | ICD-10-CM

## 2022-10-24 DIAGNOSIS — K52831 Collagenous colitis: Secondary | ICD-10-CM | POA: Diagnosis not present

## 2022-10-24 DIAGNOSIS — K529 Noninfective gastroenteritis and colitis, unspecified: Secondary | ICD-10-CM | POA: Diagnosis not present

## 2022-10-24 MED ORDER — BUDESONIDE 3 MG PO CPEP: ORAL_CAPSULE | ORAL | 0 refills | Status: DC

## 2022-10-24 NOTE — Progress Notes (Addendum)
Referring Provider: [name], MD Primary Care Physician:  [name], MD Primary GI:  Dr. Abbey Chatters  Chief Complaint  Patient presents with   Follow-up    Pt here for follow up on Collagenous colitis    HPI:   Garrett EOFF Sr. is a 65 y.o. male who presents to clinic today for follow-up visit.  History of collagenous colitis initially diagnosed 2014.  Last colonoscopy February 2023 with tubular adenoma, 5-year recall.  Biopsies consistent with collagenous colitis.  Has required budesonide off and on for years.  Takes occasional NSAIDs.  Also smokes cigarettes.  Negative celiac serologies.  Today, states he is having multiple loose Bm's daily, occasional nocturnal Bms. Imodium not helping. No melena or hematochezia.   Past Medical History:  Diagnosis Date   Atrial flutter ([place])    Chronic lower back pain    Collagenous colitis 2014   [facility]   COPD (chronic obstructive pulmonary disease) ([place])    Coronary artery calcification seen on CT scan    Essential hypertension    History of atrial flutter    Hyperlipidemia    MRSA infection    PUD (peptic ulcer disease)     Past Surgical History:  Procedure Laterality Date   BACK SURGERY     BIOPSY  06/03/2017   Procedure: BIOPSY;  Surgeon: Danie Binder, MD;  [facility];  Service: Endoscopy;;  colon   BIOPSY  01/10/2022   Procedure: BIOPSY;  Surgeon: Eloise Harman, DO;  [facility];  Service: Endoscopy;;   COLONOSCOPY  2001   Dr. Sharlett Iles, normal   COLONOSCOPY  04/2013   [facility]: Fentanyl '200mg'$ Danford Bad '10mg'$ : sigmoid diverticulosis, normal terminal ileum, small internal hemorrhoids, random colon bx: collagenous colitis. next tcs 04/2018   COLONOSCOPY  2011   [facility]: two polyps, sessile serrated adenoma, mild diverticulosis   COLONOSCOPY WITH PROPOFOL N/A 06/03/2017   Dr. Oneida Alar: Diverticulosis, three 2 to 3 mm polyps removed from the mid transverse colon and cecum, three 4 to 6 mm polyps in  the rectum and mid descending colon removed.  External/internal hemorrhoids.  hyperplastic polyps.  Random colon biopsies negative for microscopic colitis. next colonoscopy in 5 years.   COLONOSCOPY WITH PROPOFOL N/A 01/10/2022   Procedure: COLONOSCOPY WITH PROPOFOL;  Surgeon: Eloise Harman, DO;  [facility];  Service: Endoscopy;  Laterality: N/A;  11:15am   ELBOW BURSA SURGERY Right    ESOPHAGEAL DILATION N/A 05/19/2015   Procedure: ESOPHAGEAL DILATION;  Surgeon: Danie Binder, MD;  [facility];  Service: Endoscopy;  Laterality: N/A;   ESOPHAGOGASTRODUODENOSCOPY N/A 10/28/2014   [facility]: 1. Esophagitis due to GERD. KOH neg. 2. Small hiatal hernia 3. Moderate erosive gastritis 4. RUQ pain due to large ulcer 5. Taylor duodentis in the bulb.    ESOPHAGOGASTRODUODENOSCOPY N/A 05/19/2015   [facility]: 1. Mild distal esophagitis 2. Peptic stricture at the gastroesophageal junction 3. Mild non-erosive gastritis 4. Pseudo pylorus due to prior PUD.    ESOPHAGOGASTRODUODENOSCOPY N/A 03/07/2017   Dr. Oneida Alar: Benign-appearing esophageal stenosis status post dilation, mild gastritis   FINGER AMPUTATION Left 1985   index finger   HYDROCELE EXCISION Right 10/02/2018   Procedure: HYDROCELECTOMY ADULT;  Surgeon: Irine Seal, MD;  Location: [facility];  Service: Urology;  Laterality: Right;   JOINT REPLACEMENT     left hip surgery - January 26, 2014   POLYPECTOMY  06/03/2017   Procedure: POLYPECTOMY;  Surgeon: Danie Binder,  MD;  [facility];  Service: Endoscopy;;  colon   POLYPECTOMY  01/10/2022   Procedure: POLYPECTOMY;  Surgeon: Eloise Harman, DO;  [facility];  Service: Endoscopy;;   ROTATOR CUFF REPAIR Right    November 2014   SAVORY DILATION N/A 03/07/2017   Procedure: SAVORY DILATION;  Surgeon: Danie Binder, MD;  [facility];  Service: Endoscopy;  Laterality: N/A;    Current Outpatient Medications  Medication Sig Dispense Refill   albuterol  (VENTOLIN HFA) 108 (90 Base) MCG/ACT inhaler Inhale into the lungs every 6 (six) hours as needed for wheezing or shortness of breath.     apixaban (ELIQUIS) 5 MG TABS tablet TAKE 1 TABLET BY MOUTH TWICE  DAILY 200 tablet 1   atorvastatin (LIPITOR) 40 MG tablet Take 40 mg by mouth in the morning.     cholecalciferol (VITAMIN D3) 25 MCG (1000 UNIT) tablet Take 4,000 Units by mouth in the morning.     Coenzyme Q10 (CO Q 10) 100 MG CAPS Take 100 mg by mouth in the morning.     diltiazem (CARDIZEM CD) 240 MG 24 hr capsule TAKE 1 CAPSULE BY MOUTH  DAILY 100 capsule 2   EPINEPHrine 0.3 mg/0.3 mL IJ SOAJ injection Inject 0.3 mg into the muscle as needed (allergic reaction).      gabapentin (NEURONTIN) 300 MG capsule Take 300 mg by mouth 3 (three) times daily.     Glucosamine-Chondroitin 750-600 MG TABS Take 1 tablet by mouth in the morning.     Loperamide HCl (IMODIUM A-D) 1 MG/7.5ML LIQD Take 15 mLs (2 mg total) by mouth in the morning, at noon, in the evening, and at bedtime. As needed for diarrhea. 237 mL 1   melatonin 1 MG TABS tablet Take 1 mg by mouth at bedtime.     Multiple Vitamin (MULTIVITAMIN WITH MINERALS) TABS tablet Take 1 tablet by mouth daily. Centrum Multivitamin     omeprazole (PRILOSEC) 20 MG capsule Take 1 capsule (20 mg total) by mouth daily. 30 minutes before breakfast 90 capsule 3   sildenafil (VIAGRA) 100 MG tablet Take 100 mg by mouth daily as needed for erectile dysfunction.     No current facility-administered medications for this visit.    Allergies as of 10/24/2022 - Review Complete 10/24/2022  Allergen Reaction Noted   Bee venom Swelling 06/21/2012   Ciprofloxacin Other (See Comments) 08/06/2016   Levaquin [levofloxacin in d5w] Other (See Comments) 12/11/2016   Tramadol Hives 10/18/2014   Hydrocodone Rash 03/28/2013   Sulfa antibiotics Rash 03/28/2013    Family History  Problem Relation Age of Onset   Ovarian cancer Mother    Diabetes type II Father    Heart  attack Father    Arthritis Sister    Stroke Brother    Coronary artery disease Brother    Diabetes Other    Heart attack Other    Colon cancer Neg Hx    Liver disease Neg Hx    Ulcers Neg Hx     Social History   Socioeconomic History   Marital status: Widowed    Spouse name: Not on file   Number of children: Not on file   Years of education: Not on file   Highest education level: Not on file  Occupational History   Not on file  Tobacco Use   Smoking status: Every Day    Packs/day: 0.50    Years: 40.00    Total pack years: 20.00  Types: Cigarettes    Start date: 01/09/1969   Smokeless tobacco: Never  Vaping Use   Vaping Use: Never used  Substance and Sexual Activity   Alcohol use: No    Alcohol/week: 0.0 standard drinks of alcohol   Drug use: No   Sexual activity: Not on file  Other Topics Concern   Not on file  Social History Narrative   Not on file   Social Determinants of Health   Financial Resource Strain: Not on file  Food Insecurity: Not on file  Transportation Needs: Not on file  Physical Activity: Not on file  Stress: Not on file  Social Connections: Not on file    Subjective: Review of Systems  Constitutional:  Negative for chills and fever.  HENT:  Negative for congestion and hearing loss.   Eyes:  Negative for blurred vision and double vision.  Respiratory:  Negative for cough and shortness of breath.   Cardiovascular:  Negative for chest pain and palpitations.  Gastrointestinal:  Negative for abdominal pain, blood in stool, constipation, diarrhea, heartburn, melena and vomiting.  Genitourinary:  Negative for dysuria and urgency.  Musculoskeletal:  Negative for joint pain and myalgias.  Skin:  Negative for itching and rash.  Neurological:  Negative for dizziness and headaches.  Psychiatric/Behavioral:  Negative for depression. The patient is not nervous/anxious.      Objective: BP 134/85   Pulse 89   Temp (!) 97.2 F (36.2 C)   Ht 5'  10" (1.778 m)   Wt 204 lb (92.5 kg)   BMI 29.27 kg/m  Physical Exam Constitutional:      Appearance: Normal appearance.  HENT:     Head: Normocephalic and atraumatic.  Eyes:     Extraocular Movements: Extraocular movements intact.     Conjunctiva/sclera: Conjunctivae normal.  Cardiovascular:     Rate and Rhythm: Normal rate and regular rhythm.     Heart sounds: Murmur heard.  Pulmonary:     Effort: Pulmonary effort is normal.     Breath sounds: Normal breath sounds.  Abdominal:     General: Bowel sounds are normal.     Palpations: Abdomen is soft.  Musculoskeletal:        General: Normal range of motion.     Cervical back: Normal range of motion and neck supple.  Skin:    General: Skin is warm.  Neurological:     General: No focal deficit present.     Mental Status: He is alert and oriented to person, place, and time.  Psychiatric:        Mood and Affect: Mood normal.        Behavior: Behavior normal.      Assessment: *Collagenous colitis *Chronic diarrhea due to above *Adenomatous colon polyp  Plan: Patient has been without Budesonide. Will send in 3 month script, Patient to take 9 mg x8 weeks then 6 mg x2 weeks, then 3 mg x2 weeks. He may need steady amount of budesonide chronically such as 3 mg daily once symptoms under better continue. Can take imodium on top of this as needed.   Counseled on quitting smoking and limiting NSAIDs.   Colonoscopy recall February 2028 due to adenomatous colon polyps.  10/24/2022 2:11 PM   Disclaimer: This note was dictated with voice recognition software. Similar sounding words can inadvertently be transcribed and may not be corrected upon review.

## 2022-10-24 NOTE — Patient Instructions (Signed)
I will send in prescription for budesonide to your pharmacy for your chronic diarrhea/collagenous colitis.  You can take Imodium on top of this as needed.  Do your best to limit NSAID use.  Try to work on quitting smoking.  We will plan on repeat colonoscopy 2028.  Follow-up with GI in 6 months.  It was very nice seeing you again today.  Thank you for your service to our country.  Dr. Abbey Chatters

## 2022-10-25 ENCOUNTER — Ambulatory Visit (HOSPITAL_COMMUNITY)
Admission: RE | Admit: 2022-10-25 | Discharge: 2022-10-25 | Disposition: A | Discharge status: Home / Self Care | Payer: Medicare Other | Source: Ambulatory Visit | Attending: Nurse Practitioner | Admitting: Nurse Practitioner

## 2022-10-25 DIAGNOSIS — Z122 Encounter for screening for malignant neoplasm of respiratory organs: Secondary | ICD-10-CM | POA: Insufficient documentation

## 2022-10-25 DIAGNOSIS — I2510 Atherosclerotic heart disease of native coronary artery without angina pectoris: Secondary | ICD-10-CM | POA: Insufficient documentation

## 2022-10-25 DIAGNOSIS — F17210 Nicotine dependence, cigarettes, uncomplicated: Secondary | ICD-10-CM | POA: Insufficient documentation

## 2022-10-25 DIAGNOSIS — I700 Atherosclerosis of aorta: Secondary | ICD-10-CM | POA: Diagnosis not present

## 2022-10-25 DIAGNOSIS — J439 Emphysema, unspecified: Secondary | ICD-10-CM | POA: Insufficient documentation

## 2022-10-25 DIAGNOSIS — F17200 Nicotine dependence, unspecified, uncomplicated: Secondary | ICD-10-CM

## 2022-10-25 NOTE — Telephone Encounter (Signed)
Garrett Klein,  Dr Abbey Chatters sent in a Rx as well yesterday for the pt. So maybe that's why this one is refused. Different amounts

## 2022-11-04 ENCOUNTER — Telehealth: Payer: Self-pay | Admitting: *Deleted

## 2022-11-04 NOTE — Telephone Encounter (Signed)
Patient with diagnosis of atrial fibriliation on Eliquis for anticoagulation.    Procedure: Left 6th Rib plating for non-union may need thoracotomy   Date of procedure: 11/25/2022   CHA2DS2-VASc Score = 5  This indicates a 7.2% annual risk of stroke. The patient's score is based upon: CHF History: 0 HTN History: 1 Diabetes History: 0 Stroke History: 2 (evidence of prior stroke by head CT) Vascular Disease History: 1 Age Score: 1 Gender Score: 0     CrCl 73 ml/min (Adj BW) (06/07/2022) Platelet count 330 K (06/07/2022)  5 day Eliquis hold seems long, typically hold for 3 days prior to higher bleed risk procedures. Per ACC guideline  parenteral heparin/Lovenox bridging is not indicated for DOAC-treated patients.  In setting of afib with prior stroke, will forward to MD to confirm 3 day hold is acceptable.     **This guidance is not considered finalized until pre-operative APP has relayed final recommendations.**

## 2022-11-04 NOTE — Telephone Encounter (Signed)
Pharmacy, can you please give recommendations for how long Eliquis can be held prior to procedure? They are asking to hold Eliquis for 5 days prior to surgery.  Thank you!

## 2022-11-04 NOTE — Telephone Encounter (Signed)
   Pre-operative Risk Assessment    Patient Name: Garrett Klein.  DOB: Aug 30, 1956 MRN: [id number]      Request for Surgical Clearance    Procedure:   Left 6th Rib plating for non-union may need thoracotomy  Date of Surgery:  Clearance 11/25/22                                 Surgeon:  Synetta Fail, MD Surgeon's Group or Practice Name:  [facility] Phone number:  [phone] Fax number:  [phone]   Type of Clearance Requested:   - Medical  & Pharmacy   Type of Anesthesia:  General    Additional requests/questions:  Please advise surgeon/provider what medications should be held. (Will need to hold eliquis 5 days prior to surgery)  Signed, Marlou Sa   11/04/2022, 1:19 PM

## 2022-11-05 ENCOUNTER — Telehealth: Payer: Self-pay | Admitting: *Deleted

## 2022-11-05 NOTE — Telephone Encounter (Signed)
   Name: Garrett Klein  DOB: 02-25-1956  MRN: [id number]  Primary Cardiologist: Rozann Lesches, MD   Preoperative team, please contact this patient and set up a phone call appointment for further preoperative risk assessment. Please obtain consent and complete medication review. Thank you for your help.  I confirm that guidance regarding antiplatelet and oral anticoagulation therapy has been completed and, if necessary, noted below.  Per our office protocol, patient may hold Eliquis for 3 days prior to surgery.  Please resume Eliquis as soon as possible postprocedure, the discretion of the surgeon.   Lenna Sciara, NP 11/05/2022, 11:03 AM [facility]

## 2022-11-05 NOTE — Telephone Encounter (Signed)
Pt agreeable to tele pre op appt 11/12/22 @ 2 pm. Med rec and consent are done.

## 2022-11-05 NOTE — Telephone Encounter (Signed)
Pt agreeable to tele pre op appt 11/12/22 @ 2 pm. Med rec and consent are done.    Patient Consent for Virtual Visit        Garrett BURGOON Sr. has provided verbal consent on 11/05/2022 for a virtual visit (video or telephone).   CONSENT FOR VIRTUAL VISIT FOR:  Garrett AMESCUA Sr.  By participating in this virtual visit I agree to the following:  I hereby voluntarily request, consent and authorize [facility] and its employed or contracted physicians, physician assistants, nurse practitioners or other licensed health care professionals (the Practitioner), to provide me with telemedicine health care services (the "Services") as deemed necessary by the treating Practitioner. I acknowledge and consent to receive the Services by the Practitioner via telemedicine. I understand that the telemedicine visit will involve communicating with the Practitioner through live audiovisual communication technology and the disclosure of certain medical information by electronic transmission. I acknowledge that I have been given the opportunity to request an in-person assessment or other available alternative prior to the telemedicine visit and am voluntarily participating in the telemedicine visit.  I understand that I have the right to withhold or withdraw my consent to the use of telemedicine in the course of my care at any time, without affecting my right to future care or treatment, and that the Practitioner or I may terminate the telemedicine visit at any time. I understand that I have the right to inspect all information obtained and/or recorded in the course of the telemedicine visit and may receive copies of available information for a reasonable fee.  I understand that some of the potential risks of receiving the Services via telemedicine include:  Delay or interruption in medical evaluation due to technological equipment failure or disruption; Information transmitted may not be sufficient (e.g. poor  resolution of images) to allow for appropriate medical decision making by the Practitioner; and/or  In rare instances, security protocols could fail, causing a breach of personal health information.  Furthermore, I acknowledge that it is my responsibility to provide information about my medical history, conditions and care that is complete and accurate to the best of my ability. I acknowledge that Practitioner's advice, recommendations, and/or decision may be based on factors not within their control, such as incomplete or inaccurate data provided by me or distortions of diagnostic images or specimens that may result from electronic transmissions. I understand that the practice of medicine is not an exact science and that Practitioner makes no warranties or guarantees regarding treatment outcomes. I acknowledge that a copy of this consent can be made available to me via my patient portal ([facility]), or I can request a printed copy by calling the office of [facility].    I understand that my insurance will be billed for this visit.   I have read or had this consent read to me. I understand the contents of this consent, which adequately explains the benefits and risks of the Services being provided via telemedicine.  I have been provided ample opportunity to ask questions regarding this consent and the Services and have had my questions answered to my satisfaction. I give my informed consent for the services to be provided through the use of telemedicine in my medical care

## 2022-11-07 NOTE — Progress Notes (Signed)
Virtual Visit via Telephone Note   Because of Garrett JERGER Sr.'s co-morbid illnesses, he is at least at moderate risk for complications without adequate follow up.  This format is felt to be most appropriate for this patient at this time.  The patient did not have access to video technology/had technical difficulties with video requiring transitioning to audio format only (telephone).  All issues noted in this document were discussed and addressed.  No physical exam could be performed with this format.  Please refer to the patient's chart for his consent to telehealth for [facility].  Evaluation Performed:  Preoperative cardiovascular risk assessment _____________   Date:  11/12/2022   Patient ID:  Garrett Salinas Sr., DOB February 11, 1956, MRN [id number] Patient Location:  Home Provider location:   Office  Primary Care Provider:  [name], MD Primary Cardiologist:  Rozann Lesches, MD  Chief Complaint / Patient Profile   66 y.o. y/o male with a h/o paroxysmal atrial flutter, coronary artery calcification on CT, hypertension, hyperlipidemia, PUD, and COPD who is pending left sixth rib plating for nonunion, possible thoracotomy on 11/25/2022 with Dr. Synetta Fail of [place] thoracic CT surgery department and presents today for telephonic preoperative cardiovascular risk assessment.  History of Present Illness    Garrett Trueheart. is a 66 y.o. male who presents via audio/video conferencing for a telehealth visit today.  Pt was last seen in cardiology clinic on 06/05/2022 by Dr. Domenic Polite.  At that time Garrett VALADE Sr. was doing well. The patient is now pending procedure as outlined above. Since his last visit, he has done well from a cardiac standpoint.   He denies chest pain, palpitations, dyspnea, pnd, orthopnea, n, v, dizziness, syncope, edema, weight gain, or early satiety. All other systems reviewed and are otherwise negative except as noted above.    Past Medical History    Past Medical History:  Diagnosis Date   Atrial flutter ([place])    Chronic lower back pain    Collagenous colitis 2014   [facility]   COPD (chronic obstructive pulmonary disease) ([place])    Coronary artery calcification seen on CT scan    Essential hypertension    History of atrial flutter    Hyperlipidemia    MRSA infection    PUD (peptic ulcer disease)    Past Surgical History:  Procedure Laterality Date   BACK SURGERY     BIOPSY  06/03/2017   Procedure: BIOPSY;  Surgeon: Danie Binder, MD;  [facility];  Service: Endoscopy;;  colon   BIOPSY  01/10/2022   Procedure: BIOPSY;  Surgeon: Eloise Harman, DO;  [facility];  Service: Endoscopy;;   COLONOSCOPY  2001   Dr. Sharlett Iles, normal   COLONOSCOPY  04/2013   [facility]: Fentanyl '200mg'$ Danford Bad '10mg'$ : sigmoid diverticulosis, normal terminal ileum, small internal hemorrhoids, random colon bx: collagenous colitis. next tcs 04/2018   COLONOSCOPY  2011   [facility]: two polyps, sessile serrated adenoma, mild diverticulosis   COLONOSCOPY WITH PROPOFOL N/A 06/03/2017   Dr. Oneida Alar: Diverticulosis, three 2 to 3 mm polyps removed from the mid transverse colon and cecum, three 4 to 6 mm polyps in the rectum and mid descending colon removed.  External/internal hemorrhoids.  hyperplastic polyps.  Random colon biopsies negative for microscopic colitis. next colonoscopy in 5 years.   COLONOSCOPY WITH PROPOFOL N/A 01/10/2022   Procedure: COLONOSCOPY WITH PROPOFOL;  Surgeon: Eloise Harman, DO;  [facility];  Service: Endoscopy;  Laterality: N/A;  11:15am   ELBOW BURSA SURGERY Right    ESOPHAGEAL DILATION N/A 05/19/2015   Procedure: ESOPHAGEAL DILATION;  Surgeon: Danie Binder, MD;  [facility];  Service: Endoscopy;  Laterality: N/A;   ESOPHAGOGASTRODUODENOSCOPY N/A 10/28/2014   SLF: 1. Esophagitis due to GERD. KOH neg. 2. Small hiatal hernia 3. Moderate erosive gastritis 4. RUQ  pain due to large ulcer 5. [place] duodentis in the bulb.    ESOPHAGOGASTRODUODENOSCOPY N/A 05/19/2015   SLF: 1. Mild distal esophagitis 2. Peptic stricture at the gastroesophageal junction 3. Mild non-erosive gastritis 4. Pseudo pylorus due to prior PUD.    ESOPHAGOGASTRODUODENOSCOPY N/A 03/07/2017   Dr. Oneida Alar: Benign-appearing esophageal stenosis status post dilation, mild gastritis   FINGER AMPUTATION Left 1985   index finger   HYDROCELE EXCISION Right 10/02/2018   Procedure: HYDROCELECTOMY ADULT;  Surgeon: Irine Seal, MD;  Location: [facility];  Service: Urology;  Laterality: Right;   JOINT REPLACEMENT     left hip surgery - January 26, 2014   POLYPECTOMY  06/03/2017   Procedure: POLYPECTOMY;  Surgeon: Danie Binder, MD;  [facility];  Service: Endoscopy;;  colon   POLYPECTOMY  01/10/2022   Procedure: POLYPECTOMY;  Surgeon: Eloise Harman, DO;  [facility];  Service: Endoscopy;;   ROTATOR CUFF REPAIR Right    November 2014   SAVORY DILATION N/A 03/07/2017   Procedure: SAVORY DILATION;  Surgeon: Danie Binder, MD;  [facility];  Service: Endoscopy;  Laterality: N/A;    Allergies  Allergies  Allergen Reactions   Bee Venom Swelling   Ciprofloxacin Other (See Comments)    Turns red from the chest up.   Levaquin [Levofloxacin In D5w] Other (See Comments)    CANDIDA ALL OVER MOUTH TO PENIS   Tramadol Hives        Hydrocodone Rash   Sulfa Antibiotics Rash    Home Medications    Prior to Admission medications   Medication Sig Start Date End Date Taking? Authorizing Provider  albuterol (VENTOLIN HFA) 108 (90 Base) MCG/ACT inhaler Inhale into the lungs every 6 (six) hours as needed for wheezing or shortness of breath.    [provider]  apixaban (ELIQUIS) 5 MG TABS tablet TAKE 1 TABLET BY MOUTH TWICE  DAILY 08/13/22   Satira Sark, MD  atorvastatin (LIPITOR) 40 MG tablet Take 40 mg by mouth in the morning. 05/27/19   [provider]  budesonide (ENTOCORT EC) 3 MG 24 hr capsule Take 3 capsules daily x8 weeks then 2 capsules daily x2 weeks then 1 capsule daily x2 weeks then stop. 10/24/22   Eloise Harman, DO  cholecalciferol (VITAMIN D3) 25 MCG (1000 UNIT) tablet Take 4,000 Units by mouth in the morning.    [provider]  Coenzyme Q10 (CO Q 10) 100 MG CAPS Take 100 mg by mouth in the morning.    [provider]  diltiazem (CARDIZEM CD) 240 MG 24 hr capsule TAKE 1 CAPSULE BY MOUTH  DAILY 05/07/22   Satira Sark, MD  EPINEPHrine 0.3 mg/0.3 mL IJ SOAJ injection Inject 0.3 mg into the muscle as needed (allergic reaction).  Patient not taking: Reported on 11/05/2022    [provider]  gabapentin (NEURONTIN) 300 MG capsule Take 300 mg by mouth 3 (three) times daily. 11/30/21   [provider]  Glucosamine-Chondroitin 750-600 MG TABS Take 1 tablet by mouth  in the morning.    [provider]  Loperamide HCl (IMODIUM A-D) 1 MG/7.5ML LIQD Take 15 mLs (2 mg total) by mouth in the morning, at noon, in the evening, and at bedtime. As needed for diarrhea. 05/14/22   Annitta Needs, NP  melatonin 1 MG TABS tablet Take 1 mg by mouth at bedtime.    [provider]  Multiple Vitamin (MULTIVITAMIN WITH MINERALS) TABS tablet Take 1 tablet by mouth daily. Centrum Multivitamin    [provider]  omeprazole (PRILOSEC) 20 MG capsule Take 1 capsule (20 mg total) by mouth daily. 30 minutes before breakfast 11/28/21   Annitta Needs, NP  sildenafil (VIAGRA) 100 MG tablet Take 100 mg by mouth daily as needed for erectile dysfunction. Patient not taking: Reported on 11/05/2022    [provider]    Physical Exam    Vital Signs:  Garrett VITOLO Sr. does not have vital signs available for review today.  Given telephonic nature of communication, physical exam is limited. AAOx3. NAD. Normal affect.  Speech and respirations are unlabored.  Accessory Clinical  Findings    None  Assessment & Plan    1.  Preoperative Cardiovascular Risk Assessment:  According to the Revised Cardiac Risk Index (RCRI), his Perioperative Risk of Major Cardiac Event is (%): 0.4. His Functional Capacity in METs is: 5.78 according to the Duke Activity Status Index (DASI). Therefore, based on ACC/AHA guidelines, patient would be at acceptable risk for the planned procedure without further cardiovascular testing.   The patient was advised that if he develops new symptoms prior to surgery to contact our office to arrange for a follow-up visit, and he verbalized understanding.  Per our office protocol, patient may hold Eliquis for 3 days prior to surgery.  Please resume Eliquis as soon as possible postprocedure, the discretion of the surgeon.   A copy of this note will be routed to requesting surgeon.  Time:   Today, I have spent 5 minutes with the patient with telehealth technology discussing medical history, symptoms, and management plan.     Lenna Sciara, NP  11/12/2022, 2:09 PM

## 2022-11-12 ENCOUNTER — Encounter: Payer: Self-pay | Admitting: Nurse Practitioner

## 2022-11-12 ENCOUNTER — Ambulatory Visit: Payer: Medicare Other | Attending: Cardiovascular Disease | Admitting: Nurse Practitioner

## 2022-11-12 DIAGNOSIS — Z01810 Encounter for preprocedural cardiovascular examination: Secondary | ICD-10-CM

## 2022-12-03 ENCOUNTER — Other Ambulatory Visit: Payer: Self-pay | Admitting: Gastroenterology

## 2022-12-05 DIAGNOSIS — S2239XK Fracture of one rib, unspecified side, subsequent encounter for fracture with nonunion: Secondary | ICD-10-CM | POA: Diagnosis not present

## 2022-12-05 DIAGNOSIS — T148XXA Other injury of unspecified body region, initial encounter: Secondary | ICD-10-CM | POA: Diagnosis not present

## 2022-12-05 DIAGNOSIS — Z299 Encounter for prophylactic measures, unspecified: Secondary | ICD-10-CM | POA: Diagnosis not present

## 2022-12-05 DIAGNOSIS — F17210 Nicotine dependence, cigarettes, uncomplicated: Secondary | ICD-10-CM | POA: Diagnosis not present

## 2022-12-05 DIAGNOSIS — I4892 Unspecified atrial flutter: Secondary | ICD-10-CM | POA: Diagnosis not present

## 2022-12-05 DIAGNOSIS — I10 Essential (primary) hypertension: Secondary | ICD-10-CM | POA: Diagnosis not present

## 2023-01-03 DIAGNOSIS — J810 Acute pulmonary edema: Secondary | ICD-10-CM | POA: Diagnosis not present

## 2023-01-03 DIAGNOSIS — G8918 Other acute postprocedural pain: Secondary | ICD-10-CM | POA: Diagnosis not present

## 2023-01-03 DIAGNOSIS — S2242XD Multiple fractures of ribs, left side, subsequent encounter for fracture with routine healing: Secondary | ICD-10-CM | POA: Diagnosis not present

## 2023-01-03 DIAGNOSIS — G8929 Other chronic pain: Secondary | ICD-10-CM | POA: Diagnosis not present

## 2023-01-14 ENCOUNTER — Other Ambulatory Visit: Payer: Self-pay | Admitting: Cardiology

## 2023-01-14 ENCOUNTER — Encounter: Payer: Self-pay | Admitting: Cardiology

## 2023-01-14 ENCOUNTER — Ambulatory Visit: Payer: 59 | Attending: Cardiology | Admitting: Cardiology

## 2023-01-14 VITALS — BP 148/72 | HR 88 | Ht 70.0 in | Wt 197.0 lb

## 2023-01-14 DIAGNOSIS — I2510 Atherosclerotic heart disease of native coronary artery without angina pectoris: Secondary | ICD-10-CM | POA: Diagnosis not present

## 2023-01-14 DIAGNOSIS — I2584 Coronary atherosclerosis due to calcified coronary lesion: Secondary | ICD-10-CM | POA: Diagnosis not present

## 2023-01-14 DIAGNOSIS — I4892 Unspecified atrial flutter: Secondary | ICD-10-CM

## 2023-01-14 DIAGNOSIS — R030 Elevated blood-pressure reading, without diagnosis of hypertension: Secondary | ICD-10-CM | POA: Diagnosis not present

## 2023-01-14 NOTE — Progress Notes (Signed)
    Cardiology Office Note  Date: 01/14/2023   ID: TEODOR ANZURES Sr., DOB 11-May-1956, MRN [id number]  History of Present Illness: Garrett Klein. is a 67 y.o. male last seen in July 2023 and with more recent interval communication in December 2023 for preoperative assessment.  He did ultimately undergo left thorascopic reduction and plate fixation of sixth rib fracture in January, complicated by pneumothorax requiring chest tube.  Ultimately recovered and has had follow-up at [facility].  He is here today for follow-up, reports no palpitations or exertional chest pain.  Still wearing supplemental oxygen at 2 L nasal cannula with NYHA class II-III dyspnea that is chronic.  Blood pressure was elevated today, rechecked by me at 148/72.  He does track blood pressure periodically at home.  I reviewed his medications, he continues on Eliquis without spontaneous bleeding problems.  Recent lab work reviewed.  Also on Lipitor with LDL 63 in August 2023.  No palpitations on Cardizem CD.  Physical Exam: VS:  BP (!) 148/72   Pulse 88   Ht 5' 10"$  (1.778 m)   Wt 197 lb (89.4 kg)   SpO2 99% Comment: on 2L O2 via [place]  BMI 28.27 kg/m , BMI Body mass index is 28.27 kg/m.  Wt Readings from Last 3 Encounters:  01/14/23 197 lb (89.4 kg)  10/25/22 200 lb (90.7 kg)  10/24/22 204 lb (92.5 kg)    General: Patient appears comfortable at rest.  Wearing oxygen via nasal cannula. HEENT: Conjunctiva and lids normal. Neck: Supple, no elevated JVP or carotid bruits, no thyromegaly. Lungs: Decreased breath sounds without wheezing, nonlabored breathing at rest. Cardiac: Regular rate and rhythm without gallop or significant murmur. Extremities: No pitting edema.  ECG:  An ECG dated 06/05/2022 was personally reviewed today and demonstrated:  Sinus rhythm with right bundle branch block.  Labwork: 06/07/2022: ALT 22; AST 20; BUN 16; Creat 1.09; Hemoglobin 16.5; Platelets 330; Potassium 5.3; Sodium 141; TSH  1.18 July 2022: Cholesterol 140, triglycerides 157, HDL 50, LDL 63 January 2024: Magnesium 1.8, potassium 4.4, BUN 11, creatinine 0.55, hemoglobin 13.6, platelets 339  Other Studies Reviewed Today:  No interval cardiac testing for review today.  Assessment and Plan:  1.  Paroxysmal atrial flutter with CHA2DS2-VASc score of 5.  He remains on Eliquis for stroke prophylaxis.  I reviewed his recent lab work.  He does not report any spontaneous bleeding problems, no sense of palpitations on Cardizem CD.  No changes were made today.  2.  Asymptomatic coronary artery calcification by CT imaging.  He continues on Lipitor with most recent LDL 63.  3.  Elevated blood pressure, I asked him to track blood pressure at home and report back with 2 weeks of recordings.  May need to consider addition of ARB.  Disposition:  Follow up  6 months.  Signed, Satira Sark, M.D., F.A.C.C.

## 2023-01-14 NOTE — Patient Instructions (Addendum)
Medication Instructions:  Your physician recommends that you continue on your current medications as directed. Please refer to the Current Medication list given to you today.  Labwork: none  Testing/Procedures: none  Follow-Up: Your physician recommends that you schedule a follow-up appointment in: 6 months  Any Other Special Instructions Will Be Listed Below (If Applicable). Your physician has requested that you regularly monitor and record your blood pressure readings at home. Please use the same machine at the same time of day to check your readings and record them. Please bring readings to the office after 2 weeks.  If you need a refill on your cardiac medications before your next appointment, please call your pharmacy.

## 2023-01-15 NOTE — Telephone Encounter (Signed)
Prescription refill request for Eliquis received. Indication: PAFlutter Last office visit: 01/14/23  Myles Gip MD Scr: 0.55 on 11/29/22 Age: 67 Weight: 89.4kg  Based on above findings Eliquis '5mg'$  twice daily is the appropriate dose.  Refill approved.

## 2023-01-16 ENCOUNTER — Encounter: Payer: Self-pay | Admitting: Radiology

## 2023-02-18 ENCOUNTER — Other Ambulatory Visit: Payer: Self-pay | Admitting: Cardiology

## 2023-04-10 ENCOUNTER — Encounter: Payer: Self-pay | Admitting: Internal Medicine

## 2023-05-13 ENCOUNTER — Telehealth: Payer: Self-pay

## 2023-05-13 NOTE — Telephone Encounter (Signed)
Documentation from [employer] regarding the pt's supplies for his incontinence.

## 2023-05-14 ENCOUNTER — Ambulatory Visit (INDEPENDENT_AMBULATORY_CARE_PROVIDER_SITE_OTHER): Payer: 59 | Admitting: Gastroenterology

## 2023-05-14 ENCOUNTER — Encounter: Payer: Self-pay | Admitting: Gastroenterology

## 2023-05-14 VITALS — BP 134/78 | HR 85 | Temp 98.5°F | Ht 70.0 in | Wt 195.2 lb

## 2023-05-14 DIAGNOSIS — K529 Noninfective gastroenteritis and colitis, unspecified: Secondary | ICD-10-CM | POA: Diagnosis not present

## 2023-05-14 NOTE — Progress Notes (Signed)
Gastroenterology Office Note     Primary Care Physician:  Kirstie Peri, MD  Primary Gastroenterologist: Dr. Marletta Lor   Chief Complaint   Chief Complaint  Patient presents with   Abdominal Pain    Having pain on left side that radiates to back. Started about 3 -4 weeks ago. Wonders if it is related to fracture he had back in 2021.      History of Present Illness   Garrett Klein. is a 67 y.o. male presenting today with a history of collagenous colitis diagnosed in 2014. He has a remote history of polyps many years ago and also recent colonoscopy Feb 2023 with adenoma and collagenous colitis on biopsies. He required an extended budesonide course when seen April 2023 and recommendations to take Imodium prn no more than 4 times per day. Returns for follow-up, last seen Dec 2023.    On O2 nasal cannula following thorascopic surgery in Jan 2024.    Rib popping.  left thoracoscopic reduction and plate fixation of 6th rib fracture on 11/25/22. Needs pain management referral. Cardiothoracic in Feb 2024 had recommended pain management referral. Had to go through the [place] for referral. Still doesn't have appt. Not sleeping.   Vaping. Took course of budesonide. Stool is watery. He does not believe he has tried pancreatic enzymes yet.    Past Medical History:  Diagnosis Date   Atrial flutter ([facility])    Chronic lower back pain    Collagenous colitis 2014   [facility]   COPD (chronic obstructive pulmonary disease) ([facility])    Coronary artery calcification seen on CT scan    Essential hypertension    History of atrial flutter    Hyperlipidemia    MRSA infection    PUD (peptic ulcer disease)     Past Surgical History:  Procedure Laterality Date   BACK SURGERY     BIOPSY  06/03/2017   Procedure: BIOPSY;  Surgeon: West Bali, MD;  [facility];  Service: Endoscopy;;  colon   BIOPSY  01/10/2022   Procedure: BIOPSY;  Surgeon: Lanelle Bal, DO;  [facility];   Service: Endoscopy;;   COLONOSCOPY  2001   Dr. Jarold Motto, normal   COLONOSCOPY  04/2013   [facility]: Fentanyl 200mg Bonner Puna 10mg : sigmoid diverticulosis, normal terminal ileum, small internal hemorrhoids, random colon bx: collagenous colitis. next tcs 04/2018   COLONOSCOPY  2011   [facility]: two polyps, sessile serrated adenoma, mild diverticulosis   COLONOSCOPY WITH PROPOFOL N/A 06/03/2017   Dr. Darrick Penna: Diverticulosis, three 2 to 3 mm polyps removed from the mid transverse colon and cecum, three 4 to 6 mm polyps in the rectum and mid descending colon removed.  External/internal hemorrhoids.  hyperplastic polyps.  Random colon biopsies negative for microscopic colitis. next colonoscopy in 5 years.   COLONOSCOPY WITH PROPOFOL N/A 01/10/2022   Procedure: COLONOSCOPY WITH PROPOFOL;  Surgeon: Lanelle Bal, DO;  [facility];  Service: Endoscopy;  Laterality: N/A;  11:15am   ELBOW BURSA SURGERY Right    ESOPHAGEAL DILATION N/A 05/19/2015   Procedure: ESOPHAGEAL DILATION;  Surgeon: West Bali, MD;  [facility];  Service: Endoscopy;  Laterality: N/A;   ESOPHAGOGASTRODUODENOSCOPY N/A 10/28/2014   SLF: 1. Esophagitis due to GERD. KOH neg. 2. Small hiatal hernia 3. Moderate erosive gastritis 4. RUQ pain due to large ulcer 5. MIild duodentis in the bulb.    ESOPHAGOGASTRODUODENOSCOPY N/A 05/19/2015   SLF: 1. Mild distal  esophagitis 2. Peptic stricture at the gastroesophageal junction 3. Mild non-erosive gastritis 4. Pseudo pylorus due to prior PUD.    ESOPHAGOGASTRODUODENOSCOPY N/A 03/07/2017   Dr. Darrick Penna: Benign-appearing esophageal stenosis status post dilation, mild gastritis   FINGER AMPUTATION Left 1985   index finger   HYDROCELE EXCISION Right 10/02/2018   Procedure: HYDROCELECTOMY ADULT;  Surgeon: Bjorn Pippin, MD;  Location: [facility];  Service: Urology;  Laterality: Right;   JOINT REPLACEMENT     left hip surgery - January 26, 2014   POLYPECTOMY  06/03/2017   Procedure:  POLYPECTOMY;  Surgeon: West Bali, MD;  [facility];  Service: Endoscopy;;  colon   POLYPECTOMY  01/10/2022   Procedure: POLYPECTOMY;  Surgeon: Lanelle Bal, DO;  [facility];  Service: Endoscopy;;   ROTATOR CUFF REPAIR Right    November 2014   SAVORY DILATION N/A 03/07/2017   Procedure: SAVORY DILATION;  Surgeon: West Bali, MD;  [facility];  Service: Endoscopy;  Laterality: N/A;    Current Outpatient Medications  Medication Sig Dispense Refill   albuterol (VENTOLIN HFA) 108 (90 Base) MCG/ACT inhaler Inhale into the lungs every 6 (six) hours as needed for wheezing or shortness of breath.     apixaban (ELIQUIS) 5 MG TABS tablet TAKE 1 TABLET BY MOUTH TWICE  DAILY 200 tablet 1   atorvastatin (LIPITOR) 40 MG tablet Take 40 mg by mouth in the morning.     budesonide (ENTOCORT EC) 3 MG 24 hr capsule 3 mg. Takes 3 per day     Coenzyme Q10 (CO Q 10) 100 MG CAPS Take 100 mg by mouth in the morning.     diltiazem (CARDIZEM CD) 240 MG 24 hr capsule TAKE 1 CAPSULE BY MOUTH DAILY 100 capsule 1   EPINEPHrine 0.3 mg/0.3 mL IJ SOAJ injection Inject 0.3 mg into the muscle as needed (allergic reaction).     gabapentin (NEURONTIN) 300 MG capsule Take 300 mg by mouth 3 (three) times daily.     Glucosamine-Chondroitin 750-600 MG TABS Take 1 tablet by mouth in the morning.     Loperamide HCl (IMODIUM A-D) 1 MG/7.5ML LIQD Take 15 mLs (2 mg total) by mouth in the morning, at noon, in the evening, and at bedtime. As needed for diarrhea. 237 mL 1   Multiple Vitamin (MULTIVITAMIN WITH MINERALS) TABS tablet Take 1 tablet by mouth daily. Centrum Multivitamin     omeprazole (PRILOSEC) 20 MG capsule TAKE 1 CAPSULE(20 MG) BY MOUTH DAILY 30 MINUTES BEFORE BREAKFAST 90 capsule 3   sildenafil (VIAGRA) 100 MG tablet Take 100 mg by mouth daily as needed for erectile dysfunction.     No current facility-administered medications for this visit.    Allergies as of 05/14/2023 -  Review Complete 05/14/2023  Allergen Reaction Noted   Bee venom Swelling 06/21/2012   Ciprofloxacin Other (See Comments) 08/06/2016   Levaquin [levofloxacin in d5w] Other (See Comments) 12/11/2016   Tramadol Hives 10/18/2014   Hydrocodone Rash 03/28/2013   Sulfa antibiotics Rash 03/28/2013    Family History  Problem Relation Age of Onset   Ovarian cancer Mother    Diabetes type II Father    Heart attack Father    Arthritis Sister    Stroke Brother    Coronary artery disease Brother    Diabetes Other    Heart attack Other    Colon cancer Neg Hx    Liver disease Neg Hx    Ulcers Neg Hx  Social History   Socioeconomic History   Marital status: Widowed    Spouse name: Not on file   Number of children: Not on file   Years of education: Not on file   Highest education level: Not on file  Occupational History   Not on file  Tobacco Use   Smoking status: Every Day    Packs/day: 0.50    Years: 40.00    Additional pack years: 0.00    Total pack years: 20.00    Types: Cigarettes    Start date: 01/09/1969    Passive exposure: Current   Smokeless tobacco: Never  Vaping Use   Vaping Use: Never used  Substance and Sexual Activity   Alcohol use: No    Alcohol/week: 0.0 standard drinks of alcohol   Drug use: No   Sexual activity: Not on file  Other Topics Concern   Not on file  Social History Narrative   Not on file   Social Determinants of Health   Financial Resource Strain: Not on file  Food Insecurity: Not on file  Transportation Needs: Not on file  Physical Activity: Not on file  Stress: Not on file  Social Connections: Not on file  Intimate Partner Violence: Not on file     Review of Systems   Gen: Denies any fever, chills, fatigue, weight loss, lack of appetite.  CV: Denies chest pain, heart palpitations, peripheral edema, syncope.  Resp: Denies shortness of breath at rest or with exertion. Denies wheezing or cough.  GI: Denies dysphagia or  odynophagia. Denies jaundice, hematemesis, fecal incontinence. GU : Denies urinary burning, urinary frequency, urinary hesitancy MS: Denies joint pain, muscle weakness, cramps, or limitation of movement.  Derm: Denies rash, itching, dry skin Psych: Denies depression, anxiety, memory loss, and confusion Heme: Denies bruising, bleeding, and enlarged lymph nodes.   Physical Exam   BP 134/78 (BP Location: Left Arm, Patient Position: Sitting, Cuff Size: Large)   Pulse 85   Temp 98.5 F (36.9 C) (Oral)   Ht 5\' 10"  (1.778 m)   Wt 195 lb 3.2 oz (88.5 kg)   BMI 28.01 kg/m  General:   Alert and oriented. Pleasant and cooperative. Well-nourished and well-developed.  Head:  Normocephalic and atraumatic. Eyes:  Without icterus Abdomen:  +BS, soft, non-tender and non-distended. No HSM noted. No guarding or rebound. No masses appreciated.  Rectal:  Deferred  Msk:  Symmetrical without gross deformities. Normal posture. Extremities:  Without edema. Neurologic:  Alert and  oriented x4;  grossly normal neurologically. Skin:  Intact without significant lesions or rashes. Psych:  Alert and cooperative. Normal mood and affect.   Assessment   Garrett Klein. is a 67 y.o. male with a history of collagenous colitis diagnosed in 2014, adenomas with last colonoscopy Feb 2023, presenting for follow-up today.  Chronic diarrhea has remained an issue, requiring rounds of budesonide. Known history of collagenous colitis of course contributing to this; however, I would like to try empiric pancreatic enzymes prior to committing him to another round of budesonide. He ultimately may need low dose ongoing budesonide to control symptoms.   As for left-sided rib pain ,this is not related to GI process; he was encouraged to continue with plans for Pain Management, which Cardiothoracic surgery has recommended.    PLAN    Trial Zenpep 60k with meals If no improvement, low dose budesonide 3 mg 6 week close  follow-up   Gelene Mink, PhD, ANP-BC [facility]enter[facility]

## 2023-05-14 NOTE — Patient Instructions (Signed)
I want [place] to try Zenpep ONE capsule while eating your food (not before or after but DURING the meal), three times a day.   Please let me know how this works!  We might have to do low dose Entocort if Zenpep is not helpful.  I would like to see you in 6 weeks!  I enjoyed seeing you again today! I value our relationship and want to provide genuine, compassionate, and quality care. You may receive a survey regarding your visit with me, and I welcome your feedback! Thanks so much for taking the time to complete this. I look forward to seeing you again.      Gelene Mink, PhD, ANP-BC [facility]astroenter[facility]

## 2023-05-16 ENCOUNTER — Telehealth: Payer: Self-pay | Admitting: *Deleted

## 2023-05-16 NOTE — Telephone Encounter (Signed)
Pt left vm stating he was seen on Wednesday and had discussed about being referred to a pain clinic. He says that he has changed his mind and wants to be referred to the pain clinic in [place]. Please advise. Thank you

## 2023-05-21 NOTE — Telephone Encounter (Signed)
This needs to be initiated by PCP or his surgeon. It will need to go thru the [place]. Thanks!

## 2023-05-21 NOTE — Telephone Encounter (Signed)
Pt informed of providers recommendations for pain clinic.

## 2023-06-26 ENCOUNTER — Ambulatory Visit: Payer: 59 | Admitting: Gastroenterology

## 2023-06-28 ENCOUNTER — Other Ambulatory Visit: Payer: Self-pay | Admitting: Cardiology

## 2023-06-28 DIAGNOSIS — I4892 Unspecified atrial flutter: Secondary | ICD-10-CM

## 2023-06-30 NOTE — Telephone Encounter (Signed)
Prescription refill request for Eliquis received. Indication: A Flutter Last office visit: 01/14/23  Ival Bible MD Scr: 0.55 on 11/29/22  Epic Age: 67 Weight: 89.4kg  Based on above findings Eliquis 5mg  twice daily is the appropriate dose.  Refill approved.

## 2023-07-01 ENCOUNTER — Emergency Department (HOSPITAL_COMMUNITY): Payer: No Typology Code available for payment source

## 2023-07-01 ENCOUNTER — Ambulatory Visit (INDEPENDENT_AMBULATORY_CARE_PROVIDER_SITE_OTHER): Payer: 59 | Admitting: Gastroenterology

## 2023-07-01 ENCOUNTER — Encounter: Payer: Self-pay | Admitting: Gastroenterology

## 2023-07-01 ENCOUNTER — Other Ambulatory Visit: Payer: Self-pay

## 2023-07-01 ENCOUNTER — Emergency Department (HOSPITAL_COMMUNITY)
Admission: EM | Admit: 2023-07-01 | Discharge: 2023-07-01 | Disposition: A | Discharge status: Home / Self Care | Payer: No Typology Code available for payment source | Attending: Emergency Medicine | Admitting: Emergency Medicine

## 2023-07-01 ENCOUNTER — Encounter (HOSPITAL_COMMUNITY): Payer: Self-pay | Admitting: *Deleted

## 2023-07-01 VITALS — BP 136/78 | HR 84 | Temp 97.7°F | Ht 70.0 in | Wt 193.6 lb

## 2023-07-01 DIAGNOSIS — I10 Essential (primary) hypertension: Secondary | ICD-10-CM | POA: Insufficient documentation

## 2023-07-01 DIAGNOSIS — Z7901 Long term (current) use of anticoagulants: Secondary | ICD-10-CM | POA: Insufficient documentation

## 2023-07-01 DIAGNOSIS — J449 Chronic obstructive pulmonary disease, unspecified: Secondary | ICD-10-CM | POA: Diagnosis not present

## 2023-07-01 DIAGNOSIS — R0781 Pleurodynia: Secondary | ICD-10-CM | POA: Diagnosis not present

## 2023-07-01 DIAGNOSIS — K5730 Diverticulosis of large intestine without perforation or abscess without bleeding: Secondary | ICD-10-CM | POA: Diagnosis not present

## 2023-07-01 DIAGNOSIS — R1032 Left lower quadrant pain: Secondary | ICD-10-CM | POA: Diagnosis present

## 2023-07-01 DIAGNOSIS — K52831 Collagenous colitis: Secondary | ICD-10-CM

## 2023-07-01 DIAGNOSIS — I7143 Infrarenal abdominal aortic aneurysm, without rupture: Secondary | ICD-10-CM | POA: Diagnosis not present

## 2023-07-01 DIAGNOSIS — R1012 Left upper quadrant pain: Secondary | ICD-10-CM

## 2023-07-01 DIAGNOSIS — R109 Unspecified abdominal pain: Secondary | ICD-10-CM | POA: Diagnosis not present

## 2023-07-01 HISTORY — DX: Multiple fractures of ribs, unspecified side, initial encounter for closed fracture: S22.49XA

## 2023-07-01 LAB — COMPREHENSIVE METABOLIC PANEL
ALT: 31 U/L (ref 0–44)
AST: 27 U/L (ref 15–41)
Albumin: 4.1 g/dL (ref 3.5–5.0)
Alkaline Phosphatase: 92 U/L (ref 38–126)
Anion gap: 11 (ref 5–15)
BUN: 16 mg/dL (ref 8–23)
CO2: 28 mmol/L (ref 22–32)
Calcium: 9 mg/dL (ref 8.9–10.3)
Chloride: 98 mmol/L (ref 98–111)
Creatinine, Ser: 0.88 mg/dL (ref 0.61–1.24)
GFR, Estimated: >60 mL/min (ref >60)
Glucose, Bld: 101 mg/dL — ABNORMAL HIGH (ref 70–99)
Potassium: 4.4 mmol/L (ref 3.5–5.1)
Sodium: 137 mmol/L (ref 135–145)
Total Bilirubin: 0.8 mg/dL (ref 0.3–1.2)
Total Protein: 6.8 g/dL (ref 6.5–8.1)

## 2023-07-01 LAB — CBC WITH DIFFERENTIAL/PLATELET
Abs Immature Granulocytes: 0.08 10*3/uL — ABNORMAL HIGH (ref 0.00–0.07)
Basophils Absolute: 0.1 10*3/uL (ref 0.0–0.1)
Basophils Relative: 1 %
Eosinophils Absolute: 0.2 10*3/uL (ref 0.0–0.5)
Eosinophils Relative: 2 %
HCT: 47.2 % (ref 39.0–52.0)
Hemoglobin: 15.7 g/dL (ref 13.0–17.0)
Immature Granulocytes: 1 %
Lymphocytes Relative: 15 %
Lymphs Abs: 1.4 10*3/uL (ref 0.7–4.0)
MCH: 30.9 pg (ref 26.0–34.0)
MCHC: 33.3 g/dL (ref 30.0–36.0)
MCV: 92.9 fL (ref 80.0–100.0)
Monocytes Absolute: 0.8 10*3/uL (ref 0.1–1.0)
Monocytes Relative: 9 %
Neutro Abs: 6.9 10*3/uL (ref 1.7–7.7)
Neutrophils Relative %: 72 %
Platelets: 294 10*3/uL (ref 150–400)
RBC: 5.08 MIL/uL (ref 4.22–5.81)
RDW: 15 % (ref 11.5–15.5)
WBC: 9.5 10*3/uL (ref 4.0–10.5)
nRBC: 0 % (ref 0.0–0.2)

## 2023-07-01 LAB — LIPASE, BLOOD: Lipase: 22 U/L (ref 11–51)

## 2023-07-01 MED ORDER — IOHEXOL 350 MG/ML SOLN
80.0000 mL | Freq: Once | INTRAVENOUS | Status: AC | PRN
  Administered 2023-07-01: 75 mL via INTRAVENOUS

## 2023-07-01 MED ORDER — BUDESONIDE 3 MG PO CPEP: 3.0000 mg | ORAL_CAPSULE | Freq: Every day | ORAL | 1 refills | Status: DC

## 2023-07-01 MED ORDER — OXYCODONE-ACETAMINOPHEN 5-325 MG PO TABS: 1.0000 | ORAL_TABLET | ORAL | 0 refills | Status: DC | PRN

## 2023-07-01 MED ORDER — LOPERAMIDE HCL 2 MG PO TABS: 2.0000 mg | ORAL_TABLET | Freq: Four times a day (QID) | ORAL | 1 refills | Status: DC | PRN

## 2023-07-01 MED ORDER — HYDROMORPHONE HCL 1 MG/ML IJ SOLN
1.0000 mg | Freq: Once | INTRAMUSCULAR | Status: AC
  Administered 2023-07-01: 1 mg via INTRAVENOUS
  Filled 2023-07-01: qty 1

## 2023-07-01 MED ORDER — ONDANSETRON HCL 4 MG/2ML IJ SOLN
4.0000 mg | Freq: Once | INTRAMUSCULAR | Status: AC
  Administered 2023-07-01: 4 mg via INTRAVENOUS
  Filled 2023-07-01: qty 2

## 2023-07-01 MED ORDER — IOHEXOL 300 MG/ML  SOLN: 100.0000 mL | Freq: Once | INTRAMUSCULAR | Status: DC | PRN

## 2023-07-01 NOTE — Discharge Instructions (Addendum)
Your CT scan today shows that you have an unchanged infrarenal aortic aneurysm.  This appears stable at this time.  We recommend that you have a follow-up ultrasound of this every 2 years.  Your primary care provider can arrange this.  Also, please follow-up with your GI provider for recheck.  Also, follow-up with your primary care provider or with the [place].  Return to the emergency department for any new or worsening symptoms.

## 2023-07-01 NOTE — Progress Notes (Signed)
Gastroenterology Office Note     Primary Care Physician:  Kirstie Peri, MD  Primary Gastroenterologist: Dr. Marletta Lor    Chief Complaint   Chief Complaint  Patient presents with   Follow-up    Follow up chronic diarrhea     History of Present Illness   Garrett Klein. is a 67 y.o. male presenting today with a history of  collagenous colitis diagnosed in 2014. He has a remote history of polyps many years ago and also recent colonoscopy Feb 2023 with adenoma and collagenous colitis on biopsies. He has required extended budesonide courses.   Last seen June 2024 and provided Zenpep empirically.    Notes LUQ pain starting after he saw me. Feels like something is stuck. Can't sleep. This is different than his chronic rib pain. Known pain with left rib. Can't lay on back, side, wakes up from ribs. Has pain management referral from the [place].   Zenpep while eating. Made it slow down but not ideal. He has noted most improvement with taking leftover budesonide.   He is adamant he needs to go to the ED.   Past Medical History:  Diagnosis Date   Atrial flutter (HCC)    Chronic lower back pain    Collagenous colitis 2014   [facility]   COPD (chronic obstructive pulmonary disease) (HCC)    Coronary artery calcification seen on CT scan    Essential hypertension    History of atrial flutter    Hyperlipidemia    MRSA infection    PUD (peptic ulcer disease)     Past Surgical History:  Procedure Laterality Date   BACK SURGERY     BIOPSY  06/03/2017   Procedure: BIOPSY;  Surgeon: West Bali, MD;  [facility];  Service: Endoscopy;;  colon   BIOPSY  01/10/2022   Procedure: BIOPSY;  Surgeon: Lanelle Bal, DO;  [facility];  Service: Endoscopy;;   COLONOSCOPY  2001   Dr. Jarold Motto, normal   COLONOSCOPY  04/2013   [facility]: Fentanyl 200mg Bonner Puna 10mg : sigmoid diverticulosis, normal terminal ileum, small internal hemorrhoids, random colon bx: collagenous  colitis. next tcs 04/2018   COLONOSCOPY  2011   [facility]: two polyps, sessile serrated adenoma, mild diverticulosis   COLONOSCOPY WITH PROPOFOL N/A 06/03/2017   Dr. Darrick Penna: Diverticulosis, three 2 to 3 mm polyps removed from the mid transverse colon and cecum, three 4 to 6 mm polyps in the rectum and mid descending colon removed.  External/internal hemorrhoids.  hyperplastic polyps.  Random colon biopsies negative for microscopic colitis. next colonoscopy in 5 years.   COLONOSCOPY WITH PROPOFOL N/A 01/10/2022   Procedure: COLONOSCOPY WITH PROPOFOL;  Surgeon: Lanelle Bal, DO;  [facility];  Service: Endoscopy;  Laterality: N/A;  11:15am   ELBOW BURSA SURGERY Right    ESOPHAGEAL DILATION N/A 05/19/2015   Procedure: ESOPHAGEAL DILATION;  Surgeon: West Bali, MD;  [facility];  Service: Endoscopy;  Laterality: N/A;   ESOPHAGOGASTRODUODENOSCOPY N/A 10/28/2014   [facility]: 1. Esophagitis due to GERD. KOH neg. 2. Small hiatal hernia 3. Moderate erosive gastritis 4. RUQ pain due to large ulcer 5. MIild duodentis in the bulb.    ESOPHAGOGASTRODUODENOSCOPY N/A 05/19/2015   [facility]: 1. Mild distal esophagitis 2. Peptic stricture at the gastroesophageal junction 3. Mild non-erosive gastritis 4. Pseudo pylorus due to prior PUD.    ESOPHAGOGASTRODUODENOSCOPY N/A 03/07/2017   Dr. Darrick Penna: Benign-appearing esophageal stenosis status post dilation, mild  gastritis   FINGER AMPUTATION Left 1985   index finger   HYDROCELE EXCISION Right 10/02/2018   Procedure: HYDROCELECTOMY ADULT;  Surgeon: Bjorn Pippin, MD;  Location: [facility];  Service: Urology;  Laterality: Right;   JOINT REPLACEMENT     left hip surgery - January 26, 2014   POLYPECTOMY  06/03/2017   Procedure: POLYPECTOMY;  Surgeon: West Bali, MD;  [facility];  Service: Endoscopy;;  colon   POLYPECTOMY  01/10/2022   Procedure: POLYPECTOMY;  Surgeon: Lanelle Bal, DO;  [facility];  Service: Endoscopy;;    ROTATOR CUFF REPAIR Right    November 2014   SAVORY DILATION N/A 03/07/2017   Procedure: SAVORY DILATION;  Surgeon: West Bali, MD;  [facility];  Service: Endoscopy;  Laterality: N/A;    Current Outpatient Medications  Medication Sig Dispense Refill   albuterol (VENTOLIN HFA) 108 (90 Base) MCG/ACT inhaler Inhale into the lungs every 6 (six) hours as needed for wheezing or shortness of breath.     apixaban (ELIQUIS) 5 MG TABS tablet TAKE 1 TABLET BY MOUTH TWICE  DAILY 200 tablet 1   atorvastatin (LIPITOR) 40 MG tablet Take 40 mg by mouth in the morning.     budesonide (ENTOCORT EC) 3 MG 24 hr capsule 3 mg. Takes 3 per day     Coenzyme Q10 (CO Q 10) 100 MG CAPS Take 100 mg by mouth in the morning.     diltiazem (CARDIZEM CD) 240 MG 24 hr capsule TAKE 1 CAPSULE BY MOUTH DAILY 100 capsule 1   EPINEPHrine 0.3 mg/0.3 mL IJ SOAJ injection Inject 0.3 mg into the muscle as needed (allergic reaction).     gabapentin (NEURONTIN) 300 MG capsule Take 300 mg by mouth 3 (three) times daily.     Glucosamine-Chondroitin 750-600 MG TABS Take 1 tablet by mouth in the morning.     Loperamide HCl (IMODIUM A-D) 1 MG/7.5ML LIQD Take 15 mLs (2 mg total) by mouth in the morning, at noon, in the evening, and at bedtime. As needed for diarrhea. 237 mL 1   Multiple Vitamin (MULTIVITAMIN WITH MINERALS) TABS tablet Take 1 tablet by mouth daily. Centrum Multivitamin     omeprazole (PRILOSEC) 20 MG capsule TAKE 1 CAPSULE(20 MG) BY MOUTH DAILY 30 MINUTES BEFORE BREAKFAST 90 capsule 3   sildenafil (VIAGRA) 100 MG tablet Take 100 mg by mouth daily as needed for erectile dysfunction.     No current facility-administered medications for this visit.    Allergies as of 07/01/2023 - Review Complete 07/01/2023  Allergen Reaction Noted   Bee venom Swelling 06/21/2012   Ciprofloxacin Other (See Comments) 08/06/2016   Levaquin [levofloxacin in d5w] Other (See Comments) 12/11/2016   Tramadol Hives 10/18/2014    Hydrocodone Rash 03/28/2013   Sulfa antibiotics Rash 03/28/2013    Family History  Problem Relation Age of Onset   Ovarian cancer Mother    Diabetes type II Father    Heart attack Father    Arthritis Sister    Stroke Brother    Coronary artery disease Brother    Diabetes Other    Heart attack Other    Colon cancer Neg Hx    Liver disease Neg Hx    Ulcers Neg Hx     Social History   Socioeconomic History   Marital status: Widowed    Spouse name: Not on file   Number of children: Not on file   Years of education: Not  on file   Highest education level: Not on file  Occupational History   Not on file  Tobacco Use   Smoking status: Every Day    Current packs/day: 0.50    Average packs/day: 0.5 packs/day for 54.5 years (27.2 ttl pk-yrs)    Types: Cigarettes    Start date: 01/09/1969    Passive exposure: Current   Smokeless tobacco: Never  Vaping Use   Vaping status: Never Used  Substance and Sexual Activity   Alcohol use: No    Alcohol/week: 0.0 standard drinks of alcohol   Drug use: No   Sexual activity: Not on file  Other Topics Concern   Not on file  Social History Narrative   Not on file   Social Determinants of Health   Financial Resource Strain: Not on file  Food Insecurity: Not on file  Transportation Needs: Not on file  Physical Activity: Not on file  Stress: Not on file  Social Connections: Not on file  Intimate Partner Violence: Not on file     Review of Systems   Gen: Denies any fever, chills, fatigue, weight loss, lack of appetite.  CV: Denies chest pain, heart palpitations, peripheral edema, syncope.  Resp: Denies shortness of breath at rest or with exertion. Denies wheezing or cough.  GI: Denies dysphagia or odynophagia. Denies jaundice, hematemesis, fecal incontinence. GU : Denies urinary burning, urinary frequency, urinary hesitancy MS: Denies joint pain, muscle weakness, cramps, or limitation of movement.  Derm: Denies rash, itching, dry  skin Psych: Denies depression, anxiety, memory loss, and confusion Heme: Denies bruising, bleeding, and enlarged lymph nodes.   Physical Exam   BP 136/78   Pulse 84   Temp 97.7 F (36.5 C)   Ht 5\' 10"  (1.778 m)   Wt 193 lb 9.6 oz (87.8 kg)   BMI 27.78 kg/m  General:   Alert and oriented. Pleasant and cooperative. Well-nourished and well-developed.  Head:  Normocephalic and atraumatic. Eyes:  Without icterus Abdomen:  +BS, soft, TTP LUQ and non-distended. No HSM noted. No guarding or rebound. No masses appreciated.  Rectal:  Deferred  Msk:  Symmetrical without gross deformities. Normal posture. Extremities:  Without edema. Neurologic:  Alert and  oriented x4;  grossly normal neurologically. Skin:  Intact without significant lesions or rashes. Psych:  Alert and cooperative. Normal mood and affect.   Assessment   Ebin Menninger. is a 67 y.o. male presenting today with a history of  collagenous colitis diagnosed in 2014. He has a remote history of polyps many years ago and also recent colonoscopy Feb 2023 with adenoma and collagenous colitis on biopsies. He has required extended budesonide courses.   Collagenous colitis remains difficult to put into remission. Continues to smoke. He will likely need to stay on low dose budesonide long-term. I did also try Zenpep empirically, but budesonide has proven better results.   LUQ Pain: I recommended a CT as outpatient. He is declining and presenting to the ED.      PLAN    Budesonide 3 mg daily Recommended CT as outpatient, but patient is adamant he needs to go to the ED. 3 month follow-up   Gelene Mink, PhD, ANP-BC [facility] Gastroenterology   Addendum: CT 8/13 without acute abnormality. Unchanged AAA noted.

## 2023-07-01 NOTE — ED Triage Notes (Signed)
Pt with LLQ pain x 1 week, seen Lewie Loron today and she sent pt here for CT.

## 2023-07-01 NOTE — Patient Instructions (Signed)
I have refilled budesonide for you. Take 1 capsule daily. You can take Imodium as needed.  With your worsening left-sided pain, I recommend a CT. As you note it severe, then I recommend going to the emergency room.  We will see you in 3 months regardless!  I enjoyed seeing you again today! I value our relationship and want to provide genuine, compassionate, and quality care. You may receive a survey regarding your visit with me, and I welcome your feedback! Thanks so much for taking the time to complete this. I look forward to seeing you again.      Gelene Mink, PhD, ANP-BC [facility] Gastroenterology

## 2023-07-01 NOTE — ED Provider Notes (Signed)
[place] EMERGENCY DEPARTMENT AT [facility] Provider Note   CSN: [id number] Arrival date & time: 07/01/23  1452     History  Chief Complaint  Patient presents with   Abdominal Pain    Garrett KOUPAL Sr. is a 67 y.o. male.   Abdominal Pain Associated symptoms: chest pain (Left rib pain)   Associated symptoms: no chills, no cough, no diarrhea, no dysuria, no fever, no shortness of breath and no vomiting        Garrett Asis. is a 67 y.o. male with past medical history of COPD uses oxygen at home 2 L by nasal cannula as needed, collagenous colitis, atrial flutter, peptic ulcer disease, hypertension, multiple rib fractures with nonunion and plating of the #6 rib in January of this year who presents to the Emergency Department under recommendations of GI for evaluation of left abdominal pain.  He describes pain of his left lower abdomen x 1 week.  Seen by GI earlier today sent here for CT of the abdomen.  Has history of collagenous colitis takes budesonide.  He also endorses chronic pain of his left ribs secondary to nonhealing rib fractures.  [place] for pain management, but patient still does not have appointment.  Complains of ongoing pain that keeps him from sleeping.  Constant rib pain with deep breathing and movement.  States the left lower abdominal pain is new.  Denies fever, vomiting, change in urine or bowel habits no nausea or vomiting   Home Medications Prior to Admission medications   Medication Sig Start Date End Date Taking? Authorizing Provider  albuterol (VENTOLIN HFA) 108 (90 Base) MCG/ACT inhaler Inhale into the lungs every 6 (six) hours as needed for wheezing or shortness of breath.    [provider]  apixaban (ELIQUIS) 5 MG TABS tablet TAKE 1 TABLET BY MOUTH TWICE  DAILY 06/30/23   Jonelle Sidle, MD  atorvastatin (LIPITOR) 40 MG tablet Take 40 mg by mouth in the morning. 05/27/19    [provider]  budesonide (ENTOCORT EC) 3 MG 24 hr capsule Take 1 capsule (3 mg total) by mouth daily. Takes 3 per day 07/01/23   Gelene Mink, NP  Coenzyme Q10 (CO Q 10) 100 MG CAPS Take 100 mg by mouth in the morning.    [provider]  diltiazem (CARDIZEM CD) 240 MG 24 hr capsule TAKE 1 CAPSULE BY MOUTH DAILY 02/19/23   Jonelle Sidle, MD  EPINEPHrine 0.3 mg/0.3 mL IJ SOAJ injection Inject 0.3 mg into the muscle as needed (allergic reaction).    [provider]  gabapentin (NEURONTIN) 300 MG capsule Take 300 mg by mouth 3 (three) times daily. 11/30/21   [provider]  Glucosamine-Chondroitin 750-600 MG TABS Take 1 tablet by mouth in the morning.    [provider]  loperamide (IMODIUM A-D) 2 MG tablet Take 1 tablet (2 mg total) by mouth 4 (four) times daily as needed for diarrhea or loose stools. 07/01/23   Gelene Mink, NP  Multiple Vitamin (MULTIVITAMIN WITH MINERALS) TABS tablet Take 1 tablet by mouth daily. Centrum Multivitamin    [provider]  omeprazole (PRILOSEC) 20 MG capsule TAKE 1 CAPSULE(20 MG) BY MOUTH DAILY 30 MINUTES BEFORE BREAKFAST 12/04/22   Lanelle Bal, DO  sildenafil (VIAGRA) 100 MG tablet Take 100 mg by mouth daily as needed for erectile dysfunction.    [provider]  Allergies    Bee venom, Ciprofloxacin, Levaquin [levofloxacin in d5w], Tramadol, Hydrocodone, and Sulfa antibiotics    Review of Systems   Review of Systems  Constitutional:  Negative for chills and fever.  Respiratory:  Negative for cough and shortness of breath.   Cardiovascular:  Positive for chest pain (Left rib pain).  Gastrointestinal:  Positive for abdominal pain. Negative for blood in stool, diarrhea and vomiting.  Genitourinary:  Negative for decreased urine volume, dysuria and flank pain.  Musculoskeletal:  Negative for back pain.  Neurological:  Negative for weakness and headaches.    Physical Exam Updated  Vital Signs BP (!) 147/97   Pulse 77   Temp 99.1 F (37.3 C) (Oral)   Resp 18   Ht 5\' 10"  (1.778 m)   Wt 87.5 kg   SpO2 93%   BMI 27.69 kg/m  Physical Exam Vitals and nursing note reviewed.  Constitutional:      General: He is not in acute distress.    Appearance: He is well-developed. He is not toxic-appearing.     Comments: Patient tearful and appears uncomfortable  HENT:     Mouth/Throat:     Mouth: Mucous membranes are moist.  Cardiovascular:     Rate and Rhythm: Normal rate and regular rhythm.     Pulses: Normal pulses.  Pulmonary:     Effort: Pulmonary effort is normal. No respiratory distress.     Comments: Focal tenderness to palpation left lateral ribs.  bony crepitus noted on exam.  No splinting Chest:     Chest wall: Tenderness present.  Abdominal:     General: There is no distension.     Palpations: There is no mass.     Tenderness: There is abdominal tenderness.     Comments: Tender to palpation left lower quadrant.  No guarding or rebound.  Abdomen is soft.  Musculoskeletal:        General: Normal range of motion.     Right lower leg: No edema.     Left lower leg: No edema.  Skin:    General: Skin is warm.  Neurological:     Mental Status: He is alert.     ED Results / Procedures / Treatments   Labs (all labs ordered are listed, but only abnormal results are displayed) Labs Reviewed  COMPREHENSIVE METABOLIC PANEL - Abnormal; Notable for the following components:      Result Value   Glucose, Bld 101 (*)    All other components within normal limits  CBC WITH DIFFERENTIAL/PLATELET - Abnormal; Notable for the following components:   Abs Immature Granulocytes 0.08 (*)    All other components within normal limits  LIPASE, BLOOD    EKG None  Radiology CT ABDOMEN PELVIS W CONTRAST  Result Date: 07/01/2023 CLINICAL DATA:  Five day history of left upper quadrant abdominal pain EXAM: CT ABDOMEN AND PELVIS WITH CONTRAST TECHNIQUE: Multidetector CT  imaging of the abdomen and pelvis was performed using the standard protocol following bolus administration of intravenous contrast. RADIATION DOSE REDUCTION: This exam was performed according to the departmental dose-optimization program which includes automated exposure control, adjustment of the mA and/or kV according to patient size and/or use of iterative reconstruction technique. CONTRAST:  75mL OMNIPAQUE IOHEXOL 350 MG/ML SOLN COMPARISON:  CT chest dated 01/02/2022 FINDINGS: Lower chest: No focal consolidation or pulmonary nodule in the lung bases. No pleural effusion or pneumothorax demonstrated. Partially imaged heart size is normal. Coronary artery calcifications. Hepatobiliary: No focal hepatic lesions. No  intra or extrahepatic biliary ductal dilation. Normal gallbladder. Pancreas: No focal lesions or main ductal dilation. Spleen: Normal in size without focal abnormality. Adrenals/Urinary Tract: No adrenal nodules. No suspicious renal mass, calculi or hydronephrosis. Suboptimal evaluation of the left bladder base due to beam hardening artifact from hip arthroplasties. No focal wall thickening in the imaged portions. Stomach/Bowel: Normal appearance of the stomach. No evidence of bowel wall thickening, distention, or inflammatory changes. Colonic diverticulosis without acute diverticulitis. Normal appendix. Vascular/Lymphatic: Aortic atherosclerosis. Unchanged infrarenal aortic aneurysm measures 3.5 cm (remeasured). No enlarged abdominal or pelvic lymph nodes. Reproductive: Suboptimal evaluation of the left aspect of the prostate due to beam hardening artifact from hip arthroplasties. Imaged prostate appears normal size. Other: No free fluid, fluid collection, or free air. Musculoskeletal: No acute or abnormal lytic or blastic osseous lesions. Chronic left posterior tenth rib fracture and partially imaged ninth rib fracture. Status post left hip arthroplasty. Multilevel degenerative changes of the partially  imaged thoracic and lumbar spine. Unchanged anterior wedging of T12. Small fat-containing left inguinal hernia. IMPRESSION: 1. No acute abnormality in the abdomen or pelvis. No CT finding to explain left upper quadrant abdominal pain. 2. Colonic diverticulosis without acute diverticulitis. 3. Unchanged infrarenal aortic aneurysm measures 3.5 cm (remeasured). Recommend follow-up ultrasound every 2 years. This recommendation follows ACR consensus guidelines: White Paper of the ACR Incidental Findings Committee II on Vascular Findings. J Am Coll Radiol 2013; 16:109-604. 4. Aortic Atherosclerosis (ICD10-I70.0). Coronary artery calcifications. Assessment for potential risk factor modification, dietary therapy or pharmacologic therapy may be warranted, if clinically indicated. Electronically Signed   By: Agustin Cree M.D.   On: 07/01/2023 19:03    Procedures Procedures    Medications Ordered in ED Medications  HYDROmorphone (DILAUDID) injection 1 mg (has no administration in time range)  ondansetron (ZOFRAN) injection 4 mg (has no administration in time range)    ED Course/ Medical Decision Making/ A&P                                 Medical Decision Making Patient here under recommendation from GI for evaluation of left lower quadrant pain.  Has history of collagenous colitis, takes budesonide.  Abdominal pain for several days.  Also has history of chronic left chest wall/rib pain secondary to multiple nonunion rib fractures.  He underwent plating of #6 rib back in January at [facility].  Followed by the [facility], waiting for referral to pain management  Differential would include but not limited to acute diverticulitis, recurrent colitis with complication, SBO, referred pain from his ribs.  Kidney stone also considered but felt less likely given lack of urinary symptoms.  Amount and/or Complexity of Data Reviewed Labs: ordered.    Details: Labs interpreted by me, no evidence of leukocytosis, hemoglobin  reassuring.  Chemistries without significant derangement.  Lipase unremarkable Radiology: ordered.    Details: CT abdomen and pelvis ordered for further evaluation patient's symptoms.  Results are pending. Discussion of management or test interpretation with external provider(s): Left lower quadrant pain, addressed here with IV pain medication and antiemetic.  CT abdomen pelvis ordered.  Patient sent here by GI for evaluation of CT of the abdomen and pelvis.  CT finding of the abdomen and pelvis without acute abnormality to explain left lower quadrant pain.  There is diverticulosis without evidence of acute diverticulitis. Patient does have stable appearing infrarenal aortic aneurysm.  I have discussed this finding with the  patient and he is aware.  He will follow-up with GI.  He appears appropriate for discharge home.        Risk Prescription drug management.           Final Clinical Impression(s) / ED Diagnoses Final diagnoses:  Left lower quadrant abdominal pain    Rx / DC Orders ED Discharge Orders     None         Pauline Aus, PA-C 07/06/23 1341    Vanetta Mulders, MD 07/06/23 1734

## 2023-07-23 ENCOUNTER — Encounter: Payer: Self-pay | Admitting: Cardiology

## 2023-07-23 ENCOUNTER — Ambulatory Visit: Payer: 59 | Attending: Cardiology | Admitting: Cardiology

## 2023-07-23 VITALS — BP 130/70 | HR 90 | Ht 70.0 in | Wt 191.4 lb

## 2023-07-23 DIAGNOSIS — I7143 Infrarenal abdominal aortic aneurysm, without rupture: Secondary | ICD-10-CM

## 2023-07-23 DIAGNOSIS — I2584 Coronary atherosclerosis due to calcified coronary lesion: Secondary | ICD-10-CM

## 2023-07-23 DIAGNOSIS — I4892 Unspecified atrial flutter: Secondary | ICD-10-CM | POA: Diagnosis not present

## 2023-07-23 DIAGNOSIS — I2510 Atherosclerotic heart disease of native coronary artery without angina pectoris: Secondary | ICD-10-CM

## 2023-07-23 NOTE — Progress Notes (Signed)
    Cardiology Office Note  Date: 07/23/2023   ID: Garrett Rod Sr., DOB [birth year], MRN [id number]  History of Present Illness: Garrett Klein. is a 67 y.o. male last seen in February.  He is here for a routine visit.  He does not report any progressive sense of palpitations or shortness of breath.  States that he has been taking his medications regularly.  We went over the list, he continues on Eliquis without reported spontaneous bleeding problems, also Cardizem CD 240 mg daily.  ECG today shows sinus rhythm with right bundle branch block.  He had an abdominal and pelvic CT done recently during ER visit, has a stable 3.5 cm infrarenal AAA.  I reviewed his lab work, LDL was 63 last year.  He will have a physical with Dr. Sherryll Burger this year.  Physical Exam: VS:  BP 130/70 (BP Location: Left Arm)   Pulse 90   Ht 5\' 10"  (1.778 m)   Wt 191 lb 6.4 oz (86.8 kg)   SpO2 96%   BMI 27.46 kg/m , BMI Body mass index is 27.46 kg/m.  Wt Readings from Last 3 Encounters:  07/23/23 191 lb 6.4 oz (86.8 kg)  07/01/23 193 lb (87.5 kg)  07/01/23 193 lb 9.6 oz (87.8 kg)    General: Patient appears comfortable at rest. HEENT: Conjunctiva and lids normal. Neck: Supple, no elevated JVP or carotid bruits. Lungs: Clear to auscultation, nonlabored breathing at rest. Cardiac: Regular rate and rhythm, no S3 or significant systolic murmur. Extremities: No pitting edema.  ECG:  An ECG dated 06/05/2022 was personally reviewed today and demonstrated:  Sinus rhythm with right bundle branch block.  Labwork: August 2023: Cholesterol 140, triglycerides 157, HDL 50, LDL 63 07/01/2023: ALT 31; AST 27; BUN 16; Creatinine, Ser 0.88; Hemoglobin 15.7; Platelets 294; Potassium 4.4; Sodium 137   Other Studies Reviewed Today:  Abdominal and pelvic CT 07/01/2023: IMPRESSION: 1. No acute abnormality in the abdomen or pelvis. No CT finding to explain left upper quadrant abdominal pain. 2. Colonic diverticulosis  without acute diverticulitis. 3. Unchanged infrarenal aortic aneurysm measures 3.5 cm (remeasured). Recommend follow-up ultrasound every 2 years. This recommendation follows ACR consensus guidelines: White Paper of the ACR Incidental Findings Committee II on Vascular Findings. J Am Coll Radiol 2013; 29:528-413. 4. Aortic Atherosclerosis (ICD10-I70.0). Coronary artery calcifications. Assessment for potential risk factor modification, dietary therapy or pharmacologic therapy may be warranted, if clinically indicated.  Assessment and Plan:  1.  Paroxysmal atrial flutter with CHA2DS2-VASc score of 5.  He does not report any increasing palpitations and is in sinus rhythm by ECG today.  Continue Cardizem CD.  Also on Eliquis for stroke prophylaxis without spontaneous bleeding problems.  I reviewed his recent lab work.   2.  Asymptomatic coronary artery calcification by CT imaging.  He continues on Lipitor, last LDL was 63 in August 2023.  3.  Asymptomatic, infrarenal abdominal aortic aneurysm measuring 3.5 cm by recent CT.  Disposition:  Follow up  6 months.  Signed, Jonelle Sidle, M.D., F.A.C.C. [facility]

## 2023-07-23 NOTE — Patient Instructions (Signed)
Medication Instructions:  Continue all current medications.   Labwork: none  Testing/Procedures: none  Follow-Up: 6 months   Any Other Special Instructions Will Be Listed Below (If Applicable).   If you need a refill on your cardiac medications before your next appointment, please call your pharmacy.  

## 2023-08-14 DIAGNOSIS — R5383 Other fatigue: Secondary | ICD-10-CM | POA: Diagnosis not present

## 2023-08-14 DIAGNOSIS — F17210 Nicotine dependence, cigarettes, uncomplicated: Secondary | ICD-10-CM | POA: Diagnosis not present

## 2023-08-14 DIAGNOSIS — Z299 Encounter for prophylactic measures, unspecified: Secondary | ICD-10-CM | POA: Diagnosis not present

## 2023-08-14 DIAGNOSIS — Z7189 Other specified counseling: Secondary | ICD-10-CM | POA: Diagnosis not present

## 2023-08-14 DIAGNOSIS — I10 Essential (primary) hypertension: Secondary | ICD-10-CM | POA: Diagnosis not present

## 2023-08-14 DIAGNOSIS — Z0000 Encounter for general adult medical examination without abnormal findings: Secondary | ICD-10-CM | POA: Diagnosis not present

## 2023-08-14 DIAGNOSIS — Z79899 Other long term (current) drug therapy: Secondary | ICD-10-CM | POA: Diagnosis not present

## 2023-08-14 DIAGNOSIS — E7800 Pure hypercholesterolemia, unspecified: Secondary | ICD-10-CM | POA: Diagnosis not present

## 2023-08-18 DIAGNOSIS — R5383 Other fatigue: Secondary | ICD-10-CM | POA: Diagnosis not present

## 2023-08-18 DIAGNOSIS — E7800 Pure hypercholesterolemia, unspecified: Secondary | ICD-10-CM | POA: Diagnosis not present

## 2023-08-18 DIAGNOSIS — Z79899 Other long term (current) drug therapy: Secondary | ICD-10-CM | POA: Diagnosis not present

## 2023-08-19 ENCOUNTER — Ambulatory Visit: Payer: No Typology Code available for payment source | Admitting: Gastroenterology

## 2023-08-19 ENCOUNTER — Encounter: Payer: Self-pay | Admitting: Gastroenterology

## 2023-08-19 VITALS — BP 132/77 | HR 90 | Temp 98.4°F | Ht 70.0 in | Wt 198.4 lb

## 2023-08-19 DIAGNOSIS — K529 Noninfective gastroenteritis and colitis, unspecified: Secondary | ICD-10-CM

## 2023-08-19 DIAGNOSIS — K219 Gastro-esophageal reflux disease without esophagitis: Secondary | ICD-10-CM

## 2023-08-19 DIAGNOSIS — K52831 Collagenous colitis: Secondary | ICD-10-CM | POA: Diagnosis not present

## 2023-08-19 NOTE — Patient Instructions (Signed)
Let's stay on budesonide low dose just 3 milligrams each day. You can take Imodium as needed.  We will see you in 6 months!  I enjoyed seeing you again today! I value our relationship and want to provide genuine, compassionate, and quality care. You may receive a survey regarding your visit with me, and I welcome your feedback! Thanks so much for taking the time to complete this. I look forward to seeing you again.      Gelene Mink, PhD, ANP-BC [facility]

## 2023-08-19 NOTE — Progress Notes (Signed)
Gastroenterology Office Note     Primary Care Physician:  Kirstie Peri, MD  Primary Gastroenterologist: Dr. Marletta Lor    Chief Complaint   Chief Complaint  Patient presents with   Follow-up    Follow up from ED on 8/13     History of Present Illness   Garrett Klein. is a 67 y.o. male presenting today with a history of collagenous colitis diagnosed in 2014. He has a remote history of polyps many years ago and also recent colonoscopy Feb 2023 with adenoma and collagenous colitis on biopsies. He has required extended budesonide courses.   Last seen by myself in Aug 2024 with LUQ abdominal pain and requested to go to the ED. I had recommended we could pursue outpatient evaluation. Known chronic left rib pain. CT abd/pelvis with contrast in the ED 07/01/23 without acute abnormality, no diverticulitis, unchanged AAA. CBC, CMP, lipase normal.   Feels like he is back to the left rib pain. No postprandial abdominal pain. Rib pain worsened after eating. Was given amitriptyline from [facility] in [place] without improvement.    Taking Imodium twice a day as needed. Has been recommended to take budesonide only 3 mg daily. This has helped keep diarrhea at bay. Taking amitriptyline each evening even though not helping his rib pain.   He is focused on left rib pain today. [facility]    EGD 2018: benign-appearing esophageal stenosis s/p dilation, mild gastritis Colonoscopy Feb 2023 adenoma, collagenous colitis  Past Medical History:  Diagnosis Date   Atrial flutter (HCC)    Chronic lower back pain    Collagenous colitis 2014   [facility]   COPD (chronic obstructive pulmonary disease) (HCC)    Coronary artery calcification seen on CT scan    Essential hypertension    History of atrial flutter    Hyperlipidemia    MRSA infection    PUD (peptic ulcer disease)    Rib fractures     Past Surgical History:  Procedure Laterality Date    BACK SURGERY     BIOPSY  06/03/2017   Procedure: BIOPSY;  Surgeon: West Bali, MD;  [facility];  Service: Endoscopy;;  colon   BIOPSY  01/10/2022   Procedure: BIOPSY;  Surgeon: Lanelle Bal, DO;  [facility];  Service: Endoscopy;;   COLONOSCOPY  2001   Dr. Jarold Motto, normal   COLONOSCOPY  04/2013   [facility]: Fentanyl 200mg Bonner Puna 10mg : sigmoid diverticulosis, normal terminal ileum, small internal hemorrhoids, random colon bx: collagenous colitis. next tcs 04/2018   COLONOSCOPY  2011   [facility]: two polyps, sessile serrated adenoma, mild diverticulosis   COLONOSCOPY WITH PROPOFOL N/A 06/03/2017   Dr. Darrick Penna: Diverticulosis, three 2 to 3 mm polyps removed from the mid transverse colon and cecum, three 4 to 6 mm polyps in the rectum and mid descending colon removed.  External/internal hemorrhoids.  hyperplastic polyps.  Random colon biopsies negative for microscopic colitis. next colonoscopy in 5 years.   COLONOSCOPY WITH PROPOFOL N/A 01/10/2022   Procedure: COLONOSCOPY WITH PROPOFOL;  Surgeon: Lanelle Bal, DO;  [facility];  Service: Endoscopy;  Laterality: N/A;  11:15am   ELBOW BURSA SURGERY Right    ESOPHAGEAL DILATION N/A 05/19/2015   Procedure: ESOPHAGEAL DILATION;  Surgeon: West Bali, MD;  [facility];  Service: Endoscopy;  Laterality: N/A;   ESOPHAGOGASTRODUODENOSCOPY N/A 10/28/2014   [facility]: 1. Esophagitis  due to GERD. KOH neg. 2. Small hiatal hernia 3. Moderate erosive gastritis 4. RUQ pain due to large ulcer 5. MIild duodentis in the bulb.    ESOPHAGOGASTRODUODENOSCOPY N/A 05/19/2015   [facility]: 1. Mild distal esophagitis 2. Peptic stricture at the gastroesophageal junction 3. Mild non-erosive gastritis 4. Pseudo pylorus due to prior PUD.    ESOPHAGOGASTRODUODENOSCOPY N/A 03/07/2017   Dr. Darrick Penna: Benign-appearing esophageal stenosis status post dilation, mild gastritis   FINGER AMPUTATION Left 1985   index finger    HYDROCELE EXCISION Right 10/02/2018   Procedure: HYDROCELECTOMY ADULT;  Surgeon: Bjorn Pippin, MD;  Location: [facility];  Service: Urology;  Laterality: Right;   JOINT REPLACEMENT     left hip surgery - January 26, 2014   POLYPECTOMY  06/03/2017   Procedure: POLYPECTOMY;  Surgeon: West Bali, MD;  [facility];  Service: Endoscopy;;  colon   POLYPECTOMY  01/10/2022   Procedure: POLYPECTOMY;  Surgeon: Lanelle Bal, DO;  [facility];  Service: Endoscopy;;   ROTATOR CUFF REPAIR Right    November 2014   SAVORY DILATION N/A 03/07/2017   Procedure: SAVORY DILATION;  Surgeon: West Bali, MD;  [facility];  Service: Endoscopy;  Laterality: N/A;    Current Outpatient Medications  Medication Sig Dispense Refill   albuterol (VENTOLIN HFA) 108 (90 Base) MCG/ACT inhaler Inhale into the lungs every 6 (six) hours as needed for wheezing or shortness of breath.     apixaban (ELIQUIS) 5 MG TABS tablet TAKE 1 TABLET BY MOUTH TWICE  DAILY 200 tablet 1   atorvastatin (LIPITOR) 40 MG tablet Take 40 mg by mouth in the morning.     BREO ELLIPTA 100-25 MCG/ACT AEPB Inhale 1 puff into the lungs daily.     budesonide (ENTOCORT EC) 3 MG 24 hr capsule Take 1 capsule (3 mg total) by mouth daily. Takes 3 per day 90 capsule 1   diltiazem (CARDIZEM CD) 240 MG 24 hr capsule TAKE 1 CAPSULE BY MOUTH DAILY 100 capsule 1   gabapentin (NEURONTIN) 300 MG capsule Take 300 mg by mouth 3 (three) times daily.     Glucosamine-Chondroitin 750-600 MG TABS Take 1 tablet by mouth in the morning.     loperamide (IMODIUM A-D) 2 MG tablet Take 1 tablet (2 mg total) by mouth 4 (four) times daily as needed for diarrhea or loose stools. 120 tablet 1   Multiple Vitamin (MULTIVITAMIN WITH MINERALS) TABS tablet Take 1 tablet by mouth daily. Centrum Multivitamin     omeprazole (PRILOSEC) 20 MG capsule TAKE 1 CAPSULE(20 MG) BY MOUTH DAILY 30 MINUTES BEFORE BREAKFAST 90 capsule 3   RESTASIS 0.05 % ophthalmic  emulsion Place 1 drop into both eyes 2 (two) times daily.     sildenafil (VIAGRA) 100 MG tablet Take 100 mg by mouth daily as needed for erectile dysfunction.     Coenzyme Q10 (CO Q 10) 100 MG CAPS Take 100 mg by mouth in the morning. (Patient not taking: Reported on 08/19/2023)     EPINEPHrine 0.3 mg/0.3 mL IJ SOAJ injection Inject 0.3 mg into the muscle as needed (allergic reaction). (Patient not taking: Reported on 08/19/2023)     oxyCODONE-acetaminophen (PERCOCET/ROXICET) 5-325 MG tablet Take 1 tablet by mouth every 4 (four) hours as needed. (Patient not taking: Reported on 07/23/2023) 15 tablet 0   No current facility-administered medications for this visit.    Allergies as of 08/19/2023 - Review Complete 08/19/2023  Allergen Reaction Noted  Bee venom Swelling 06/21/2012   Ciprofloxacin Other (See Comments) 08/06/2016   Levaquin [levofloxacin in d5w] Other (See Comments) 12/11/2016   Tramadol Hives 10/18/2014   Hydrocodone Rash 03/28/2013   Sulfa antibiotics Rash 03/28/2013    Family History  Problem Relation Age of Onset   Ovarian cancer Mother    Diabetes type II Father    Heart attack Father    Arthritis Sister    Stroke Brother    Coronary artery disease Brother    Diabetes Other    Heart attack Other    Colon cancer Neg Hx    Liver disease Neg Hx    Ulcers Neg Hx     Social History   Socioeconomic History   Marital status: Widowed    Spouse name: Not on file   Number of children: Not on file   Years of education: Not on file   Highest education level: Not on file  Occupational History   Not on file  Tobacco Use   Smoking status: Every Day    Current packs/day: 0.50    Average packs/day: 0.5 packs/day for 54.6 years (27.3 ttl pk-yrs)    Types: Cigarettes    Start date: 01/09/1969    Passive exposure: Current   Smokeless tobacco: Never  Vaping Use   Vaping status: Never Used  Substance and Sexual Activity   Alcohol use: No    Alcohol/week: 0.0 standard  drinks of alcohol   Drug use: No   Sexual activity: Not on file  Other Topics Concern   Not on file  Social History Narrative   Not on file   Social Determinants of Health   Financial Resource Strain: Not on file  Food Insecurity: Not on file  Transportation Needs: Not on file  Physical Activity: Not on file  Stress: Not on file  Social Connections: Not on file  Intimate Partner Violence: Not on file     Review of Systems   Gen: Denies any fever, chills, fatigue, weight loss, lack of appetite.  CV: Denies chest pain, heart palpitations, peripheral edema, syncope.  Resp: Denies shortness of breath at rest or with exertion. Denies wheezing or cough.  GI: Denies dysphagia or odynophagia. Denies jaundice, hematemesis, fecal incontinence. GU : Denies urinary burning, urinary frequency, urinary hesitancy MS: Denies joint pain, muscle weakness, cramps, or limitation of movement.  Derm: Denies rash, itching, dry skin Psych: Denies depression, anxiety, memory loss, and confusion Heme: Denies bruising, bleeding, and enlarged lymph nodes.   Physical Exam   BP 132/77   Pulse 90   Temp 98.4 F (36.9 C)   Ht 5\' 10"  (1.778 m)   Wt 198 lb 6.4 oz (90 kg)   BMI 28.47 kg/m  General:   Alert and oriented. Pleasant and cooperative. Well-nourished and well-developed.  Head:  Normocephalic and atraumatic. Eyes:  Without icterus Abdomen:  +BS, soft, non-tender and non-distended. No HSM noted. No guarding or rebound. No masses appreciated. Left rib margin pain with palpation Rectal:  Deferred  Msk:  Symmetrical without gross deformities. Normal posture. Extremities:  Without edema. Neurologic:  Alert and  oriented x4;  grossly normal neurologically. Skin:  Intact without significant lesions or rashes. Psych:  Alert and cooperative. Normal mood and affect.   Assessment   Garrett MILLHOUSE Sr. is a 67 y.o. male presenting today with a history of  collagenous colitis, adenomas, GERD,  chronic left-sided rib pain, here for follow-up.  Left rib pain: known chronic non-union of posterior left  ribs. No GI component to this. Prior LUQ pain has resolved. [facility].  Collagenous colitis: initially diagnosed in 2014, with most recent biopsies confirmed also in 2023. He has been unable to come off budesonide completely and remains on 3 mg daily and Imodium prn.   GERD: PPI daily with good control.     PLAN    Continue low dose budesonide as unable to wean off Return in 6 months  Surveillance colonoscopy 2028   Gelene Mink, PhD, ANP-BC [facility]

## 2023-08-27 ENCOUNTER — Emergency Department (HOSPITAL_COMMUNITY)
Admission: EM | Admit: 2023-08-27 | Discharge: 2023-08-27 | Disposition: A | Discharge status: Home / Self Care | Payer: 59

## 2023-08-27 ENCOUNTER — Encounter (HOSPITAL_COMMUNITY): Payer: Self-pay

## 2023-08-27 ENCOUNTER — Other Ambulatory Visit: Payer: Self-pay

## 2023-08-27 DIAGNOSIS — S2242XS Multiple fractures of ribs, left side, sequela: Secondary | ICD-10-CM | POA: Diagnosis not present

## 2023-08-27 DIAGNOSIS — X58XXXS Exposure to other specified factors, sequela: Secondary | ICD-10-CM | POA: Insufficient documentation

## 2023-08-27 DIAGNOSIS — Z79899 Other long term (current) drug therapy: Secondary | ICD-10-CM | POA: Diagnosis not present

## 2023-08-27 DIAGNOSIS — S299XXS Unspecified injury of thorax, sequela: Secondary | ICD-10-CM | POA: Diagnosis present

## 2023-08-27 DIAGNOSIS — Z7901 Long term (current) use of anticoagulants: Secondary | ICD-10-CM | POA: Insufficient documentation

## 2023-08-27 DIAGNOSIS — S2242XD Multiple fractures of ribs, left side, subsequent encounter for fracture with routine healing: Secondary | ICD-10-CM | POA: Diagnosis not present

## 2023-08-27 MED ORDER — OXYCODONE-ACETAMINOPHEN 5-325 MG PO TABS
1.0000 | ORAL_TABLET | Freq: Once | ORAL | Status: AC
  Administered 2023-08-27: 1 via ORAL
  Filled 2023-08-27: qty 1

## 2023-08-27 MED ORDER — LIDOCAINE 4 % EX PTCH: 1.0000 | MEDICATED_PATCH | CUTANEOUS | 0 refills | Status: DC

## 2023-08-27 MED ORDER — LIDOCAINE 5 % EX PTCH
1.0000 | MEDICATED_PATCH | Freq: Once | CUTANEOUS | Status: DC
  Administered 2023-08-27: 1 via TRANSDERMAL
  Filled 2023-08-27: qty 1

## 2023-08-27 MED ORDER — ACETAMINOPHEN 325 MG PO TABS
325.0000 mg | ORAL_TABLET | Freq: Once | ORAL | Status: AC
  Administered 2023-08-27: 325 mg via ORAL
  Filled 2023-08-27: qty 1

## 2023-08-27 MED ORDER — METHOCARBAMOL 500 MG PO TABS
500.0000 mg | ORAL_TABLET | Freq: Once | ORAL | Status: AC
  Administered 2023-08-27: 500 mg via ORAL
  Filled 2023-08-27: qty 1

## 2023-08-27 NOTE — Discharge Instructions (Addendum)
Please follow-up with your primary doctor for pain management follow-up.  May 2000, Tylenol may use the lidocaine patches we are prescribing.  Return immediately if develop fevers, chills, worsening chest pain, SOB or any new or worsening symptoms that are concerning to you.

## 2023-08-27 NOTE — ED Triage Notes (Addendum)
C/o left rib pain worse after awaken this am.   Pt reports chronic left rib pain for years now and been trying to get set up with pain clinic for pain.  Tenderness noted on palpitation.  Denies sob/chest pain  3lpm [place] baseline at home

## 2023-08-27 NOTE — ED Provider Notes (Signed)
[place] EMERGENCY DEPARTMENT AT [facility] Provider Note   CSN: [id number] Arrival date & time: 08/27/23  1113     History  Chief Complaint  Patient presents with   Rib Injury    Garrett GUEDES Sr. is a 67 y.o. male.  67 year old male presenting to the emergency department for left-sided rib pain.  Has history of chronic rib pains after old rib fractures several years ago.  He is in process to get into pain management for the same pain.  Awoke this morning, thinks he slept on his ribs wrong and had slightly worse pain.  It is his typical pain character, although slightly more intense.  Not having shortness of breath or any other symptoms besides his typical pain.        Home Medications Prior to Admission medications   Medication Sig Start Date End Date Taking? Authorizing Provider  lidocaine 4 % Place 1 patch onto the skin daily. 08/27/23  Yes Maite Burlison, Harmon Dun, DO  albuterol (VENTOLIN HFA) 108 (90 Base) MCG/ACT inhaler Inhale into the lungs every 6 (six) hours as needed for wheezing or shortness of breath.    [provider]  apixaban (ELIQUIS) 5 MG TABS tablet TAKE 1 TABLET BY MOUTH TWICE  DAILY 06/30/23   Jonelle Sidle, MD  atorvastatin (LIPITOR) 40 MG tablet Take 40 mg by mouth in the morning. 05/27/19   [provider]  BREO ELLIPTA 100-25 MCG/ACT AEPB Inhale 1 puff into the lungs daily. 06/18/23   [provider]  budesonide (ENTOCORT EC) 3 MG 24 hr capsule Take 1 capsule (3 mg total) by mouth daily. Takes 3 per day 07/01/23   Gelene Mink, NP  diltiazem (CARDIZEM CD) 240 MG 24 hr capsule TAKE 1 CAPSULE BY MOUTH DAILY 02/19/23   Jonelle Sidle, MD  EPINEPHrine 0.3 mg/0.3 mL IJ SOAJ injection Inject 0.3 mg into the muscle as needed (allergic reaction). Patient not taking: Reported on 08/19/2023    [provider]  gabapentin (NEURONTIN) 300 MG capsule Take 300 mg by mouth 3 (three) times daily. 11/30/21   [provider]  Glucosamine-Chondroitin 750-600 MG TABS Take 1 tablet by mouth in the morning.    [provider]  loperamide (IMODIUM A-D) 2 MG tablet Take 1 tablet (2 mg total) by mouth 4 (four) times daily as needed for diarrhea or loose stools. 07/01/23   Gelene Mink, NP  Multiple Vitamin (MULTIVITAMIN WITH MINERALS) TABS tablet Take 1 tablet by mouth daily. Centrum Multivitamin    [provider]  omeprazole (PRILOSEC) 20 MG capsule TAKE 1 CAPSULE(20 MG) BY MOUTH DAILY 30 MINUTES BEFORE BREAKFAST 12/04/22   Earnest Bailey K, DO  RESTASIS 0.05 % ophthalmic emulsion Place 1 drop into both eyes 2 (two) times daily. 02/03/23   [provider]  sildenafil (VIAGRA) 100 MG tablet Take 100 mg by mouth daily as needed for erectile dysfunction.    [provider]      Allergies    Bee venom, Hydrocodone, Penicillins, Sulfa antibiotics, Ciprofloxacin, Levaquin [levofloxacin in d5w], and Tramadol    Review of Systems   Review of Systems  Physical Exam Updated Vital Signs BP (!) 116/43   Pulse 67   Temp 98 F (36.7 C)   Resp 13   Ht 5\' 10"  (1.778 m)   Wt 88.5 kg   SpO2 100%   BMI 27.98 kg/m  Physical Exam Vitals and nursing note reviewed.  Constitutional:  General: He is not in acute distress.    Appearance: He is not toxic-appearing.  HENT:     Head: Normocephalic.     Mouth/Throat:     Mouth: Mucous membranes are moist.  Eyes:     Conjunctiva/sclera: Conjunctivae normal.  Cardiovascular:     Rate and Rhythm: Normal rate.  Pulmonary:     Effort: Pulmonary effort is normal. No respiratory distress.     Breath sounds: Normal breath sounds. No wheezing, rhonchi or rales.  Abdominal:     General: Abdomen is flat. There is no distension.     Tenderness: There is no abdominal tenderness.  Skin:    General: Skin is warm.  Neurological:     General: No focal deficit present.     Mental Status: He is alert.     ED Results / Procedures / Treatments    Labs (all labs ordered are listed, but only abnormal results are displayed) Labs Reviewed - No data to display  EKG None  Radiology No results found.  Procedures Procedures    Medications Ordered in ED Medications  lidocaine (LIDODERM) 5 % 1 patch (1 patch Transdermal Patch Applied 08/27/23 1244)  acetaminophen (TYLENOL) tablet 325 mg (325 mg Oral Given 08/27/23 1244)  oxyCODONE-acetaminophen (PERCOCET/ROXICET) 5-325 MG per tablet 1 tablet (1 tablet Oral Given 08/27/23 1244)  methocarbamol (ROBAXIN) tablet 500 mg (500 mg Oral Given 08/27/23 1244)    ED Course/ Medical Decision Making/ A&P                                 Medical Decision Making Well-appearing 67 year old male presenting emergency department for left-sided rib pain in the setting of chronic rib fractures.  Vital signs reassuring.  Maintaining oxygen saturation on his home 2 L of oxygen.  He has clear and equal breath sounds bilaterally.  Per chart review does have history of multiple rib fractures.  He is in process to get into pain management.  Patient given Tylenol, lidocaine patch, Robaxin and Percocet for breakthrough pain.  Symptoms improved.  Consider chest x-ray, however his symptoms are seemingly chronic in nature and essentially unchanged.  Equal breath sounds bilaterally; low suspicion for intrathoracic process such as pneumonia or pneumothorax.  Patient stable for discharge at this time.  Risk OTC drugs. Prescription drug management.          Final Clinical Impression(s) / ED Diagnoses Final diagnoses:  Closed fracture of multiple ribs of left side, sequela    Rx / DC Orders ED Discharge Orders          Ordered    lidocaine 4 %  Every 24 hours        08/27/23 1309              Coral Spikes, DO 08/27/23 1641

## 2023-09-18 DIAGNOSIS — I10 Essential (primary) hypertension: Secondary | ICD-10-CM | POA: Diagnosis not present

## 2023-09-18 DIAGNOSIS — Z299 Encounter for prophylactic measures, unspecified: Secondary | ICD-10-CM | POA: Diagnosis not present

## 2023-09-18 DIAGNOSIS — I700 Atherosclerosis of aorta: Secondary | ICD-10-CM | POA: Diagnosis not present

## 2023-09-18 DIAGNOSIS — G8929 Other chronic pain: Secondary | ICD-10-CM | POA: Diagnosis not present

## 2023-09-18 DIAGNOSIS — J439 Emphysema, unspecified: Secondary | ICD-10-CM | POA: Diagnosis not present

## 2023-09-29 DIAGNOSIS — I10 Essential (primary) hypertension: Secondary | ICD-10-CM | POA: Diagnosis not present

## 2023-09-29 DIAGNOSIS — I4892 Unspecified atrial flutter: Secondary | ICD-10-CM | POA: Diagnosis not present

## 2023-09-29 DIAGNOSIS — R52 Pain, unspecified: Secondary | ICD-10-CM | POA: Diagnosis not present

## 2023-09-29 DIAGNOSIS — Z299 Encounter for prophylactic measures, unspecified: Secondary | ICD-10-CM | POA: Diagnosis not present

## 2023-09-29 DIAGNOSIS — J449 Chronic obstructive pulmonary disease, unspecified: Secondary | ICD-10-CM | POA: Diagnosis not present

## 2023-09-29 DIAGNOSIS — F17210 Nicotine dependence, cigarettes, uncomplicated: Secondary | ICD-10-CM | POA: Diagnosis not present

## 2023-09-29 DIAGNOSIS — M1711 Unilateral primary osteoarthritis, right knee: Secondary | ICD-10-CM | POA: Diagnosis not present

## 2023-09-29 DIAGNOSIS — I700 Atherosclerosis of aorta: Secondary | ICD-10-CM | POA: Diagnosis not present

## 2023-09-30 ENCOUNTER — Telehealth: Payer: Self-pay | Admitting: *Deleted

## 2023-09-30 ENCOUNTER — Other Ambulatory Visit: Payer: Self-pay

## 2023-09-30 DIAGNOSIS — J449 Chronic obstructive pulmonary disease, unspecified: Secondary | ICD-10-CM

## 2023-09-30 NOTE — Progress Notes (Signed)
  Care Coordination  Outreach Note  09/30/2023 Name: Garrett HEMME Sr. MRN: [id number] DOB: 09-19-1956   Care Coordination Outreach Attempts: An unsuccessful telephone outreach was attempted today to offer the patient information about available care coordination services.  Follow Up Plan:  Additional outreach attempts will be made to offer the patient care coordination information and services.   Encounter Outcome:  No Answer  Gwenevere Ghazi  Care Coordination Care Guide  Direct Dial: [phone]

## 2023-10-01 NOTE — Progress Notes (Signed)
  Care Coordination   Note   10/01/2023 Name: Garrett PRABHAKAR Sr. MRN: [id number] DOB: 1956/07/28  Garrett Rod Sr. is a 67 y.o. year old male who sees Kirstie Peri, MD for primary care. I reached out to Garrett Rod Sr. by phone today to offer care coordination services.  Garrett Klein was given information about Care Coordination services today including:   The Care Coordination services include support from the care team which includes your Nurse Coordinator, Clinical Social Worker, or Pharmacist.  The Care Coordination team is here to help remove barriers to the health concerns and goals most important to you. Care Coordination services are voluntary, and the patient may decline or stop services at any time by request to their care team member.   Care Coordination Consent Status: Patient agreed to services and verbal consent obtained.   Follow up plan:  Telephone appointment with care coordination team member scheduled for:  10/03/23  Encounter Outcome:  Patient Scheduled  [facility] Coordination Care Guide  Direct Dial: [phone]

## 2023-10-03 ENCOUNTER — Ambulatory Visit: Payer: Self-pay | Admitting: *Deleted

## 2023-10-03 ENCOUNTER — Encounter: Payer: Self-pay | Admitting: *Deleted

## 2023-10-03 NOTE — Patient Outreach (Signed)
Care Coordination   Initial Visit Note   10/03/2023 Name: Garrett Klein Sr. MRN: [id number] DOB: 20-Apr-1956  Garrett Rod Sr. is a 67 y.o. year old male who sees Kirstie Peri, MD for primary care. I spoke with  Garrett Rod Sr. by phone today.  What matters to the patients health and wellness today?  Obtaining personal care services and managing chronic pain.    Goals Addressed             This Visit's Progress    Care Coordination Services       Care Management Goals: Patient will talk with Garrett Klein, BSW on 10/13/23 at 2:15 regarding Personal Care Service application Patient will keep appointment fo [facility] 6 week [facility] Management program beginning on 10/07/23 Patient will keep appointment with PCP on 11/10/23 (or sooner if appointment can be moved up) and will take from [employer] visit that was done yesterday.  HHPT recommended for gait abnormality Review and complete Advanced Care Planning documents Patient will reach out to RN Care Manager at [phone] with any resource or care coordination needs        SDOH assessments and interventions completed:  Yes  SDOH Interventions Today    Flowsheet Row Most Recent Value  SDOH Interventions   Food Insecurity Interventions Intervention Not Indicated  Housing Interventions Intervention Not Indicated  Transportation Interventions Intervention Not Indicated  Utilities Interventions Intervention Not Indicated  Financial Strain Interventions Intervention Not Indicated  Physical Activity Interventions Other (Comments)  [Patient will be seeing PCP for referral for HHPT]  Social Connections Interventions Intervention Not Indicated  Health Literacy Interventions Intervention Not Indicated        Care Coordination Interventions:  Yes, provided  Interventions Today    Flowsheet Row Most Recent Value  Chronic Disease   Chronic disease during today's visit Hypertension (HTN), Chronic  Obstructive Pulmonary Disease (COPD), Other  [Chronic pain left ribs S/P fracture in 05/2020 (Chronic ununited posterior left sixth, seventh, eighth, ninth and  tenth rib fractures)]  General Interventions   General Interventions Discussed/Reviewed General Interventions Discussed, General Interventions Reviewed, Durable Medical Equipment (DME), Doctor Visits, Communication with, Level of Care  Doctor Visits Discussed/Reviewed Doctor Visits Discussed, Doctor Visits Reviewed, PCP, Specialist, Annual Wellness Visits  Durable Medical Equipment (DME) BP Cuff, Oxygen  [Pulse ox. Patient states blood pressure well controlled at this time. Reports readings around 127/80. Uses supplemental O2 at 2L via nasal cannula at night. Prescribed CPAP but unable to wear that due to sleep position necessary to avoid causing rib pain]  PCP/Specialist Visits Compliance with follow-up visit  [facility] [place], [place] on 10/07/23 for pain management proram. 11/10/23 with PCP (may be able to move this appointment up). Dr Garrett Klein (cardiology) on 01/29/24. Garrett Loron, NP (gastroenterology) on 02/26/24]  Communication with PCP/Specialists, Social Work  [Secure Fax sent to PCP office requesting to schedule appointment sooner than 11/10/23 to discuss order for HHPT. May need a sooner appointment for [facility] application as well. Staff message to Garrett Klein, BSW Re: need for personal care services]  Level of Care Personal Care Services  Applications Personal Care Services  Exercise Interventions   Exercise Discussed/Reviewed Physical Activity  Physical Activity Discussed/Reviewed Physical Activity Discussed, Physical Activity Reviewed  [Able to perform most ADLs but could use some help with vacuuming and light cleaning due to chronic left sided rib pain. Unable to exercise at this time. Willing to work with HHPT on gait training and strengenthing.]  Education Interventions   Education Provided Provided Education  Provided Verbal  Education On Other, When to see the doctor, Mental Health/Coping with Illness, Applications, Medication, Exercise, Nutrition  [Blood pressure monitoring. Advance Care Planning. Brief overview of Personal Care Services]  Applications Personal Care Services  Mental Health Interventions   Mental Health Discussed/Reviewed Mental Health Discussed, Mental Health Reviewed, Refer to Social Work for resources  Refer to Social Work for resources regarding Other  [scheduled for 10/13/23 for assistance with PCS application]  Nutrition Interventions   Nutrition Discussed/Reviewed Nutrition Discussed, Nutrition Reviewed  Pharmacy Interventions   Pharmacy Dicussed/Reviewed Pharmacy Topics Discussed, Pharmacy Topics Reviewed, Medications and their functions  [Taking medications as prescribed. Uses lidocaine patches on ribs. Takes amitriptyline and gabapentin for left rib pain. Does not want to be on narcotics long term, but will take them from time to time if needed for severe pain.]  Safety Interventions   Safety Discussed/Reviewed Safety Discussed, Safety Reviewed, Fall Risk, Home Safety  Home Safety Assistive Devices, Contact provider for referral to PT/OT  [Does not use assistive devices for ambulation]  Advanced Directive Interventions   Advanced Directives Discussed/Reviewed Advanced Directives Discussed, Advanced Directives Reviewed, Advanced Care Planning, Provided resource for acquiring and filling out documents  [facility] Advanced Care Planning packet. Updated emergency contact to nephew, per patient's request.]       Follow up plan: Follow up call scheduled for 10/31/23    Encounter Outcome:  Patient Visit Completed   Garrett Loll, RN, BSN Care Management Coordinator [facility]  [facility] Direct Dial: [phone] Main #: [phone]

## 2023-10-13 ENCOUNTER — Ambulatory Visit: Payer: Self-pay

## 2023-10-13 NOTE — Patient Outreach (Signed)
  Care Coordination   Initial Visit Note   10/13/2023 Name: JANOAH SAVOCA Sr. MRN: [id number] DOB: 1956/08/07  [name] is a 67 y.o. year old male who sees Kirstie Peri, MD for primary care. I spoke with  [name] by phone today.  What matters to the patients health and wellness today?  Patient needs assistance in the home with personal care.    Goals Addressed             This Visit's Progress    Care Coordination Services       Interventions Today    Flowsheet Row Most Recent Value  Chronic Disease   Chronic disease during today's visit Chronic Obstructive Pulmonary Disease (COPD), Hypertension (HTN)  General Interventions   General Interventions Discussed/Reviewed General Interventions Discussed, General Interventions Reviewed  [Pt wants PCS due to issues w/broken ribs. Pt struggles with mobility, dressing, transfering from sitting/laying to standing and cleaning.Pt currently has Meals on Wheels but would also like a cooked meals. SW completed and faxed PCS request to provider.]              SDOH assessments and interventions completed:  Yes  SDOH Interventions Today    Flowsheet Row Most Recent Value  SDOH Interventions   Food Insecurity Interventions Intervention Not Indicated, Other (Comment)  [Foodstamps and Meals on Wheels]  Housing Interventions Intervention Not Indicated  Transportation Interventions Intervention Not Indicated, Other (Comment)  [Has car]  Utilities Interventions Intervention Not Indicated        Care Coordination Interventions:  Yes, provided   Follow up plan: Follow up call scheduled for 10/30/23 at 1pm    Encounter Outcome:  Patient Visit Completed

## 2023-10-13 NOTE — Patient Instructions (Signed)
Visit Information  Thank you for taking time to visit with me today. Please don't hesitate to contact me if I can be of assistance to you.   Following are the goals we discussed today:   SW will fax PCS form to provider for review.   Our next appointment is by telephone on 10/30/23 at 1pm  Please call the care guide team at [phone] if you need to cancel or reschedule your appointment.   If you are experiencing a Mental Health or Behavioral Health Crisis or need someone to talk to, please call 911  Patient verbalizes understanding of instructions and care plan provided today and agrees to view in MyChart. Active MyChart status and patient understanding of how to access instructions and care plan via MyChart confirmed with patient.     Telephone follow up appointment with care management team member scheduled for:10/30/23 at 1pm.  Lysle Morales, BSW Social Worker [phone]

## 2023-10-14 DIAGNOSIS — I10 Essential (primary) hypertension: Secondary | ICD-10-CM | POA: Diagnosis not present

## 2023-10-14 DIAGNOSIS — Z299 Encounter for prophylactic measures, unspecified: Secondary | ICD-10-CM | POA: Diagnosis not present

## 2023-10-14 DIAGNOSIS — M1710 Unilateral primary osteoarthritis, unspecified knee: Secondary | ICD-10-CM | POA: Diagnosis not present

## 2023-10-14 DIAGNOSIS — R52 Pain, unspecified: Secondary | ICD-10-CM | POA: Diagnosis not present

## 2023-10-14 DIAGNOSIS — F17210 Nicotine dependence, cigarettes, uncomplicated: Secondary | ICD-10-CM | POA: Diagnosis not present

## 2023-10-14 DIAGNOSIS — M545 Low back pain, unspecified: Secondary | ICD-10-CM | POA: Diagnosis not present

## 2023-10-14 DIAGNOSIS — G8929 Other chronic pain: Secondary | ICD-10-CM | POA: Diagnosis not present

## 2023-10-17 ENCOUNTER — Other Ambulatory Visit: Payer: Self-pay | Admitting: Cardiology

## 2023-10-30 ENCOUNTER — Ambulatory Visit: Payer: Self-pay

## 2023-10-30 NOTE — Patient Instructions (Signed)
Visit Information  Thank you for taking time to visit with me today. Please don't hesitate to contact me if I can be of assistance to you.   Following are the goals we discussed today:  Patient will await response from [facility] on [facility] request.   Our next appointment is by telephone on 11/17/23 at 1pm.  Please call the care guide team at [phone] if you need to cancel or reschedule your appointment.   If you are experiencing a Mental Health or Behavioral Health Crisis or need someone to talk to, please call 911  Patient verbalizes understanding of instructions and care plan provided today and agrees to view in MyChart. Active MyChart status and patient understanding of how to access instructions and care plan via MyChart confirmed with patient.     Telephone follow up appointment with care management team member scheduled for: 11/17/23 at 1pm  Lysle Morales, BSW Social Worker [phone]

## 2023-10-30 NOTE — Patient Outreach (Signed)
  Care Coordination   Follow Up Visit Note   10/30/2023 Name: ESTEFANO CROMBIE Sr. MRN: [id number] DOB: [birth year]  [name] is a 67 y.o. year old male who sees Kirstie Peri, MD for primary care. I spoke with  [name] by phone today.  What matters to the patients health and wellness today?  Patient needs PCS in the home.    Goals Addressed             This Visit's Progress    Care Coordination Services       Interventions Today    Flowsheet Row Most Recent Value  General Interventions   General Interventions Discussed/Reviewed General Interventions Discussed, General Interventions Reviewed  [Pt has not received an update from [employer] on PCS services.   SW agreed to follow up in 2 wks to see if patient has been approved.]              SDOH assessments and interventions completed:  No     Care Coordination Interventions:  Yes, provided   Follow up plan: Follow up call scheduled for 11/17/23 1pm.    Encounter Outcome:  Patient Visit Completed

## 2023-10-31 ENCOUNTER — Ambulatory Visit: Payer: Self-pay | Admitting: *Deleted

## 2023-10-31 ENCOUNTER — Encounter: Payer: Self-pay | Admitting: *Deleted

## 2023-10-31 NOTE — Patient Outreach (Signed)
  Care Coordination   Follow Up Visit Note   10/31/2023 Name: TAYLON LAFFITTE Sr. MRN: [id number] DOB: 1956/07/12  [name] is a 67 y.o. year old male who sees Kirstie Peri, MD for primary care. I spoke with  [name] by phone today.  What matters to the patients health and wellness today?  Setting up Personal Care Services and managing pain.    Goals Addressed             This Visit's Progress    Care Coordination Services   On track    Care Management Goals: Patient will follow-up with Lysle Morales, BSW Re: PCS application through [facility]) Patient will keep appointments with [facility] for 6 week Pain Management program. 2 weeks remaining Supposed to see a panel of 5 providers after the program is complete Patient will keep appointment with PCP on 11/10/23 (or sooner if appointment can be moved up)   [employer] recommended HHPT for gait abnormality  Review and complete Advanced Care Planning documents Patient will reach out to RN Care Manager at [phone] with any resource or care coordination needs        SDOH assessments and interventions completed:  Yes  SDOH Interventions Today    Flowsheet Row Most Recent Value  SDOH Interventions   Transportation Interventions Intervention Not Indicated  Financial Strain Interventions Intervention Not Indicated        Care Coordination Interventions:  Yes, provided  Interventions Today    Flowsheet Row Most Recent Value  Chronic Disease   Chronic disease during today's visit Chronic Obstructive Pulmonary Disease (COPD), Hypertension (HTN), Other  [chronic pain due to displaced rib fractures]  General Interventions   General Interventions Discussed/Reviewed General Interventions Discussed, General Interventions Reviewed, Doctor Visits, Level of Care  Doctor Visits Discussed/Reviewed Doctor Visits Discussed, Doctor Visits Reviewed, Specialist  [Has went to  4 out of 6 pain management appointments at the [facility] office in [place], [place]. 2 sessions left. Unsure if it is helping. He is to follow-up with a panel of 5 doctors after completion of the program.]  PCP/Specialist Visits Compliance with follow-up visit  [Keep appointment with PCP on 11/10/23]  Level of Care Personal Care Services  [working with Lysle Morales, BSW Re: personal care services. Application has been submitted. Awaiting a call from [place] [employer]. Provided with their contact information.]  Exercise Interventions   Exercise Discussed/Reviewed Physical Activity  [Performing ADLs but difficult due to chronic pain. [employer] nurse recommended HHPT. He will talk with PCP at office visit and take paperwork from [facility] nurse with him.]  Physical Activity Discussed/Reviewed Physical Activity Discussed, Physical Activity Reviewed  Education Interventions   Education Provided Provided Education  Provided Verbal Education On [employer], When to see the doctor, Medication, Exercise  Pharmacy Interventions   Pharmacy Dicussed/Reviewed Pharmacy Topics Discussed, Pharmacy Topics Reviewed, Medications and their functions  [patient reports that amitriptyline was increased to two tablets at bedtime. States that it doesn't really help with pain but it does help him get to sleep.]  Safety Interventions   Safety Discussed/Reviewed Safety Discussed, Safety Reviewed       Follow up plan: Follow up call scheduled for 11/27/23    Encounter Outcome:  Patient Visit Completed   Demetrios Loll, RN, BSN Care Manager [place]  [facility]  Population Health  Direct Dial: [phone] Main #: [phone]

## 2023-11-06 DIAGNOSIS — Z79891 Long term (current) use of opiate analgesic: Secondary | ICD-10-CM | POA: Diagnosis not present

## 2023-11-17 ENCOUNTER — Ambulatory Visit: Payer: Self-pay

## 2023-11-17 NOTE — Patient Outreach (Signed)
  Care Coordination   Follow Up Visit Note   11/17/2023 Name: WHITFIELD FOBBS Sr. MRN: [id number] DOB: 1956-04-18  [name] is a 67 y.o. year old male who sees Kirstie Peri, MD for primary care. I spoke with  [name] by phone today.  What matters to the patients health and wellness today?  Patient needs Personal Care Services.    Goals Addressed             This Visit's Progress    Care Coordination Services       Interventions Today    Flowsheet Row Most Recent Value  General Interventions   General Interventions Discussed/Reviewed General Interventions Discussed, General Interventions Reviewed, Communication with  [Pt has not received a reply regarding PCS services. Pt has [employer] and another number, but when SW called the additional number it was not in service. Pt reports he is ok and able to manage in ADL's for right not but still needs help.]  Communication with PCP/Specialists  [SW contact provider office and spoke to [place]. The PCS form was not received and [place] requested another fax copy.  Sw faxed PCS referral to Tracy's attention.]              SDOH assessments and interventions completed:  No     Care Coordination Interventions:  Yes, provided   Follow up plan: Follow up call scheduled for 11/25/23 at 11am.    Encounter Outcome:  Patient Visit Completed

## 2023-11-17 NOTE — Patient Instructions (Signed)
Visit Information  Thank you for taking time to visit with me today. Please don't hesitate to contact me if I can be of assistance to you.   Following are the goals we discussed today:  Patient will await response from [employer] on PCS services.  Our next appointment is by telephone on 11/25/23 at 11am  Please call the care guide team at [phone] if you need to cancel or reschedule your appointment.   If you are experiencing a Mental Health or Behavioral Health Crisis or need someone to talk to, please call 911  Patient verbalizes understanding of instructions and care plan provided today and agrees to view in MyChart. Active MyChart status and patient understanding of how to access instructions and care plan via MyChart confirmed with patient.     Telephone follow up appointment with care management team member scheduled for: 11/25/23 at 11am.    Lysle Morales, BSW Social Worker [phone]

## 2023-11-25 ENCOUNTER — Ambulatory Visit: Payer: Self-pay

## 2023-11-25 NOTE — Patient Outreach (Signed)
  Care Coordination   11/25/2023 Name: Garrett RAM Sr. MRN: [id number] DOB: 03/28/1956   Care Coordination Outreach Attempts:  An unsuccessful outreach was attempted for an appointment today.  Follow Up Plan:  Additional outreach attempts will be made to offer the patient complex care management information and services.   Encounter Outcome:  No Answer   Care Coordination Interventions:  No, not indicated    Tillman Gardener, BSW Social Worker [phone]

## 2023-11-25 NOTE — Patient Outreach (Signed)
  Care Coordination   Follow Up Visit Note   11/25/2023 Name: TREG DIEMER Sr. MRN: [id number] DOB: 1956/07/12  Christopher JONETTA Candy Sr. is a 68 y.o. year old male who sees Maree Isles, MD for primary care. I spoke with  Christopher JONETTA Candy Sr. by phone today.  What matters to the patients health and wellness today?  Patient has been approved for Personal Care Services. Patient reports no other unmet needs.   Goals Addressed             This Visit's Progress    Care Coordination Services       Interventions Today    Flowsheet Row Most Recent Value  General Interventions   General Interventions Discussed/Reviewed General Interventions Discussed, General Interventions Reviewed, Referral to Nurse  [Pt reports PCS approved and 12/01/23 assessment scheduled.Pt is concerned about positive test at [facility]. SW directs patient to speak with nurse at the next apt on 01/09.]              SDOH assessments and interventions completed:  No     Care Coordination Interventions:  Yes, provided   Follow up plan: No further intervention required.   Encounter Outcome:  Patient Visit Completed

## 2023-11-25 NOTE — Patient Instructions (Signed)
 Visit Information  Thank you for taking time to visit with me today. Please don't hesitate to contact me if I can be of assistance to you.   Following are the goals we discussed today:  Patient has been approved for [facility] and assessment scheduled 12/01/23.   If you are experiencing a Mental Health or Behavioral Health Crisis or need someone to talk to, please call 911  Patient verbalizes understanding of instructions and care plan provided today and agrees to view in MyChart. Active MyChart status and patient understanding of how to access instructions and care plan via MyChart confirmed with patient.     No further follow up required: No follow up appointment is requested.  Tillman Gardener, BSW Social Worker [phone]

## 2023-11-27 ENCOUNTER — Ambulatory Visit: Payer: Self-pay | Admitting: *Deleted

## 2023-11-27 ENCOUNTER — Encounter: Payer: Self-pay | Admitting: *Deleted

## 2023-11-27 ENCOUNTER — Other Ambulatory Visit: Payer: Self-pay | Admitting: Internal Medicine

## 2023-11-27 NOTE — Patient Outreach (Signed)
 Care Coordination   Follow Up Visit Note   11/27/2023 Name: JEMERY STACEY Sr. MRN: [id number] DOB: 1956-07-25  Christopher JONETTA Candy Sr. is a 68 y.o. year old male who sees Maree Isles, MD for primary care. I spoke with  Christopher JONETTA Candy Sr. by phone today.  What matters to the patients health and wellness today?  Finding solution for chronic rib pain    Goals Addressed             This Visit's Progress    Care Coordination Services   On track    Care Management Goals: Patient will work with [place] [facility]) Re: Personal Care Services Has been approved and scheduled for home visit on 12/01/23 Patient will reach out to [place] at [phone] with any questions regarding personal care services Review and complete Advanced Care Planning documents Patient will talk with RN Care Manager regarding treatment options at [facility] in [place] [phone]) Patient will take medication as directed Patient will reach out to RN Care Manager at [phone] with any resource or care coordination needs       SDOH assessments and interventions completed:  Yes SDOH Interventions Today    Flowsheet Row Most Recent Value  SDOH Interventions   Transportation Interventions Intervention Not Indicated  Stress Interventions Other (Comment)  [chronic pain and concerned about ability to qualify for pain management treatment and surgical options]         Care Coordination Interventions:  Yes, provided  Interventions Today    Flowsheet Row Most Recent Value  Chronic Disease   Chronic disease during today's visit Other  [Chronic Pain due to non-unioned rib fractures]  General Interventions   General Interventions Discussed/Reviewed Doctor Visits, Labs, General Interventions Reviewed, General Interventions Discussed, Level of Care, Communication with  Labs --  [Tox[employer] Select 13 on 11/06/23. Patient tested positive for 3 metabolites associated  with Diazepam. He denies diazepam use. Elavil can potentially cause false positive results for some metabolites of diazepam.]  Doctor Visits Discussed/Reviewed Doctor Visits Discussed, Doctor Visits Reviewed, Specialist  [Completed 6 week pain management program through [facility]. Offered congratulations for completing program. He will be receiving a certificate in the mail. He is distressed over failing drug screen. He is adamant that he has not taken diazepam.]  PCP/Specialist Visits Compliance with follow-up visit  [Keep appointments with PCP.]  Communication with PCP/Specialists  [Outreach to [facility] Re: positive drug screen for metabolites of diazepam. Can patient still procede with nerve block surgical procedure even if they are unable to prescribe pain medication?]  Level of Care Personal Care Services  [Has been approved for personal care services. First home visit is scheduled for 12/01/23.]  Exercise Interventions   Exercise Discussed/Reviewed Physical Activity  Physical Activity Discussed/Reviewed Physical Activity Discussed, Physical Activity Reviewed  [limited by chronic pain due to old rib fractures that are not in alignment. Unable to complete all ADLs independently due to significant pain.]  Education Interventions   Education Provided Provided Education  Provided Verbal Education On Labs, When to see the doctor, Mental Health/Coping with Illness, Exercise, Medication  Labs Reviewed --  bethann taxassure lab results from [facility]]  Mental Health Interventions   Mental Health Discussed/Reviewed Mental Health Discussed, Mental Health Reviewed, Coping Strategies  [stress related to chronic pain and inability to find treatment that helps and he is worried that [facility] will not be  able to provide treatment options due to drug screen results]  Nutrition Interventions   Nutrition Discussed/Reviewed Nutrition Discussed,  Nutrition Reviewed  Pharmacy Interventions   Pharmacy Dicussed/Reviewed Pharmacy Topics Discussed, Medications and their functions, Pharmacy Topics Reviewed  [patient reports taking medications as directed. He denies taking medications that he is not prescribed. Elavil helps some with sleep but does not help with pain. Taking gabapentin as well will little to no improvement.]  Safety Interventions   Safety Discussed/Reviewed Safety Discussed, Safety Reviewed, Fall Risk, Home Safety  Home Safety Assistive Devices  Advanced Directive Interventions   Advanced Directives Discussed/Reviewed Advanced Directives Discussed, Advanced Care Planning  [return completed advanced care packet]       Follow up plan: Follow up call scheduled for 11/28/23    Encounter Outcome:  Patient Visit Completed   Josette Pellet, RN, BSN Care Manager [place]  [facility]  Population Health  Direct Dial: [phone] Main #: [phone]

## 2023-11-28 ENCOUNTER — Ambulatory Visit: Payer: Self-pay | Admitting: *Deleted

## 2023-11-28 NOTE — Patient Outreach (Signed)
  Care Coordination   Documentation Note   11/28/2023 Name: Garrett DEHARO Sr. MRN: [id number] DOB: 1956/03/29  Garrett JONETTA Candy Sr. is a 68 y.o. year old male who sees Maree Isles, MD for primary care. I  reached out to [facility] regarding pain management and left a message requesting a return call.   What matters to the patients health and wellness today?  Being able to work with [facility] regarding chronic pain management.   SDOH assessments and interventions completed:  No     Care Coordination Interventions:  No, not indicated   Follow up plan: Follow up call scheduled for 12/05/23    Encounter Outcome:  Patient Visit Completed   Josette Pellet, RN, BSN [place]  [facility], [facility] RN Care Manager [email or web address] Direct Dial: [phone] Fax: [phone]

## 2023-12-04 ENCOUNTER — Encounter: Payer: Self-pay | Admitting: *Deleted

## 2023-12-04 ENCOUNTER — Telehealth: Payer: Self-pay | Admitting: *Deleted

## 2023-12-04 NOTE — Patient Outreach (Unsigned)
  Care Coordination   Follow Up Visit Note   12/04/2023 Name: Garrett FACKRELL Sr. MRN: [id number] DOB: Mar 09, 1956  Garrett Rod Sr. is a 68 y.o. year old male who sees Kirstie Peri, MD for primary care. I spoke with  Garrett Rod Sr. by phone today.  What matters to the patients health and wellness today?  Getting into pain management clinic    Goals Addressed   None     SDOH assessments and interventions completed:  Yes{THN Tip this will not be part of the note when signed-REQUIRED REPORT FIELD DO NOT DELETE (Optional):27901}  SDOH Interventions Today    Flowsheet Row Most Recent Value  SDOH Interventions   Transportation Interventions Intervention Not Indicated  Physical Activity Interventions --  [Patient will be seeing PCP for referral for HHPT]        Care Coordination Interventions:  Yes, provided {THN Tip this will not be part of the note when signed-REQUIRED REPORT FIELD DO NOT DELETE (Optional):27901}  Follow up plan: Follow up call scheduled for 12/05/23    Encounter Outcome:  Patient Visit Completed {THN Tip this will not be part of the note when signed-REQUIRED REPORT FIELD DO NOT DELETE (Optional):27901}  Demetrios Loll, RN, BSN [place]  [facility], [facility] RN Care Manager Direct Dial: [phone]

## 2023-12-05 ENCOUNTER — Ambulatory Visit: Payer: Self-pay | Admitting: *Deleted

## 2023-12-05 ENCOUNTER — Encounter: Payer: Self-pay | Admitting: *Deleted

## 2023-12-05 NOTE — Patient Outreach (Unsigned)
  Care Coordination   Follow Up Visit Note   12/05/2023 Name: Garrett CLABORN Sr. MRN: [id number] DOB: 06-23-1956  Garrett Rod Sr. is a 68 y.o. year old male who sees Garrett Peri, MD for primary care. I spoke with  Garrett Rod Sr. by phone today.  What matters to the patients health and wellness today?  Finding a doctor to manage chronic pain.    Goals Addressed   None     SDOH assessments and interventions completed:  No{THN Tip this will not be part of the note when signed-REQUIRED REPORT FIELD DO NOT DELETE (Optional):27901}     Care Coordination Interventions:  Yes, provided {THN Tip this will not be part of the note when signed-REQUIRED REPORT FIELD DO NOT DELETE (Optional):27901}  Follow up plan: Follow up call scheduled for ***   Encounter Outcome:  Patient Visit Completed {THN Tip this will not be part of the note when signed-REQUIRED REPORT FIELD DO NOT DELETE (Optional):27901}  Demetrios Loll, RN, BSN   [facility], [facility] RN Care Manager Direct Dial: [phone]

## 2023-12-06 ENCOUNTER — Other Ambulatory Visit: Payer: Self-pay | Admitting: Cardiology

## 2023-12-06 DIAGNOSIS — I4892 Unspecified atrial flutter: Secondary | ICD-10-CM

## 2023-12-08 NOTE — Telephone Encounter (Signed)
Prescription refill request for Eliquis received. Indication: PAF Last office visit: 07/23/23  Ival Bible MD Scr: 0.88 on 07/01/23  Epic Age: 68 Weight: 86.8kg  Based on above findings Eliquis 5mg  twice daily is the appropriate dose.  Refill approved.

## 2023-12-20 DEATH — deceased

## 2024-01-29 ENCOUNTER — Ambulatory Visit: Payer: 59 | Admitting: Cardiology

## 2024-02-26 ENCOUNTER — Ambulatory Visit: Payer: 59 | Admitting: Gastroenterology
